# Patient Record
Sex: Female | Born: 1990 | Hispanic: Yes | Marital: Married | Smoking: Never smoker
Health system: Southern US, Community
[De-identification: ages and names within clinical notes are randomized; demographics above are authoritative.]

## PROBLEM LIST (undated history)

## (undated) ENCOUNTER — Inpatient Hospital Stay: Payer: Self-pay

## (undated) DIAGNOSIS — I10 Essential (primary) hypertension: Secondary | ICD-10-CM

## (undated) DIAGNOSIS — R519 Headache, unspecified: Secondary | ICD-10-CM

## (undated) HISTORY — DX: Essential (primary) hypertension: I10

## (undated) HISTORY — DX: Headache, unspecified: R51.9

---
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
## 2017-11-01 DIAGNOSIS — O24419 Gestational diabetes mellitus in pregnancy, unspecified control: Secondary | ICD-10-CM

## 2017-11-01 HISTORY — DX: Gestational diabetes mellitus in pregnancy, unspecified control: O24.419

## 2017-11-01 NOTE — L&D Delivery Note (Signed)
Delivery Note  First Stage: Labor onset: 1500 Augmentation : Pitocin Analgesia /Anesthesia intrapartum: epidural SROM at 1500  Second Stage: Complete dilation at 2215 Onset of pushing at 2215 FHR second stage n/a, precipitous delivery.   Delivery of a viable female on 06/24/18 at 2215 by CNM delivery of fetal head in LOA position with restitution to LOT. Loose nuchal cord x 1;  Anterior then posterior shoulders delivered easily with gentle downward traction. Baby placed on mom's chest, and attended to by peds.  Cord double clamped after cessation of pulsation, cut by FOB Cord blood sample collected    Third Stage: Placenta delivered spontaneously intact with [facility]3VC @ 2228 Placenta disposition: routine disposal Uterine tone firm / bleeding small  Right periurethral hemostatic laceration, no repair. No cervical/vaginal lacs.  Anesthesia for repair: n/a Est. Blood Loss (mL): 190  Complications: clot expressed by nursing approx 75min after delivery, additional QBL: 168ml; instructed to empty pt bladder and continue pitocin, fundus firm with scant lochia.   Mom to postpartum.  Baby to Couplet care / Skin to Skin.  Newborn: Birth Weight: 6#12  Apgar Scores: 9/9 Feeding planned: Both

## 2017-11-08 ENCOUNTER — Emergency Department: Payer: Self-pay

## 2017-11-08 ENCOUNTER — Other Ambulatory Visit: Payer: Self-pay

## 2017-11-08 ENCOUNTER — Emergency Department
Admission: EM | Admit: 2017-11-08 | Discharge: 2017-11-08 | Disposition: A | Discharge status: Home / Self Care | Payer: Self-pay | Attending: Student in an Organized Health Care Education/Training Program | Admitting: Student in an Organized Health Care Education/Training Program

## 2017-11-08 DIAGNOSIS — O26891 Other specified pregnancy related conditions, first trimester: Secondary | ICD-10-CM

## 2017-11-08 DIAGNOSIS — N390 Urinary tract infection, site not specified: Secondary | ICD-10-CM

## 2017-11-08 DIAGNOSIS — O2691 Pregnancy related conditions, unspecified, first trimester: Secondary | ICD-10-CM | POA: Insufficient documentation

## 2017-11-08 DIAGNOSIS — Z3A01 Less than 8 weeks gestation of pregnancy: Secondary | ICD-10-CM | POA: Insufficient documentation

## 2017-11-08 DIAGNOSIS — R102 Pelvic and perineal pain: Secondary | ICD-10-CM | POA: Insufficient documentation

## 2017-11-08 LAB — URINALYSIS, COMPLETE (UACMP) WITH MICROSCOPIC
Bilirubin Urine: NEGATIVE
Glucose, UA: NEGATIVE mg/dL
Hgb urine dipstick: NEGATIVE
Ketones, ur: NEGATIVE mg/dL
Leukocytes, UA: NEGATIVE
Nitrite: NEGATIVE
PH: 5 (ref 5.0–8.0)
Protein, ur: NEGATIVE mg/dL
SPECIFIC GRAVITY, URINE: 1.018 (ref 1.005–1.030)

## 2017-11-08 LAB — CBC WITH DIFFERENTIAL/PLATELET
BASOS ABS: 0 10*3/uL (ref 0–0.1)
Basophils Relative: 0 %
EOS PCT: 1 %
Eosinophils Absolute: 0.1 10*3/uL (ref 0–0.7)
HCT: 41.4 % (ref 35.0–47.0)
Hemoglobin: 14.5 g/dL (ref 12.0–16.0)
LYMPHS PCT: 28 %
Lymphs Abs: 2.7 10*3/uL (ref 1.0–3.6)
MCH: 32.1 pg (ref 26.0–34.0)
MCHC: 34.9 g/dL (ref 32.0–36.0)
MCV: 91.9 fL (ref 80.0–100.0)
MONO ABS: 0.5 10*3/uL (ref 0.2–0.9)
Monocytes Relative: 5 %
Neutro Abs: 6.3 10*3/uL (ref 1.4–6.5)
Neutrophils Relative %: 66 %
PLATELETS: 338 10*3/uL (ref 150–440)
RBC: 4.51 MIL/uL (ref 3.80–5.20)
RDW: 13.2 % (ref 11.5–14.5)
WBC: 9.6 10*3/uL (ref 3.6–11.0)

## 2017-11-08 LAB — BASIC METABOLIC PANEL
ANION GAP: 9 (ref 5–15)
BUN: 7 mg/dL (ref 6–20)
CALCIUM: 9.4 mg/dL (ref 8.9–10.3)
CO2: 22 mmol/L (ref 22–32)
CREATININE: 0.56 mg/dL (ref 0.44–1.00)
Chloride: 104 mmol/L (ref 101–111)
GFR calc Af Amer: >60 mL/min (ref >60)
GLUCOSE: 100 mg/dL — AB (ref 65–99)
Potassium: 4 mmol/L (ref 3.5–5.1)
Sodium: 135 mmol/L (ref 135–145)

## 2017-11-08 LAB — ABO/RH: ABO/RH(D): O POS

## 2017-11-08 LAB — HCG, QUANTITATIVE, PREGNANCY: HCG, BETA CHAIN, QUANT, S: 12887 m[IU]/mL — AB (ref <5)

## 2017-11-08 MED ORDER — NITROFURANTOIN MONOHYD MACRO 100 MG PO CAPS: 100.0000 mg | ORAL_CAPSULE | Freq: Two times a day (BID) | ORAL | 0 refills | Status: AC

## 2017-11-08 NOTE — ED Triage Notes (Signed)
Pt had positive home pregnancy test. Right sided abdominal pain. Sent from health department. Pt alert and oriented X4, active, cooperative, pt in NAD. RR even and unlabored, color WNL.

## 2017-11-08 NOTE — ED Triage Notes (Signed)
FIRST NURSE NOTE-sent from health dept. Pt pregnant based on urine and sent for r/o ectopic r/t RLQ pains

## 2017-11-08 NOTE — ED Notes (Signed)
Lab notified to run urine sample that was sent down with save label.

## 2017-11-08 NOTE — ED Provider Notes (Signed)
[facility] Emergency Department Provider Note    First MD Initiated Contact with Patient 11/08/17 1047     (approximate)  I have reviewed the triage vital signs and the nursing notes.   HISTORY  Chief Complaint Abdominal Pain    HPI Kathy Benton is a 27 y.o. female G2 P1 presents for evaluation concern for ectopic pregnancy.  Patient is uncertain of how long she is pregnant how far along she is as she was previously on Depo-Provera and had very irregular menses.  States that for the past few months she started feeling more sluggish and intermittently with nausea so she checked a home pregnancy test and it was positive.  She then went to the public health department today because she has been having cramping pain on the right side for the past 5 days.  No fevers.  No vaginal bleeding.  States the pain is moderate to severe when it happens but is brief in nature.  They repeated a home pregnancy test which was positive and referred her to the ER due to concern for ectopic.  Patient denies any syncopal episodes.  Denies any pain at this time.  History reviewed. No pertinent past medical history. No family history on file. History reviewed. No pertinent surgical history. There are no active problems to display for this patient.     Prior to Admission medications   Not on File    Allergies Patient has no known allergies.    Social History Social History   Tobacco Use  . Smoking status: Never Smoker  Substance Use Topics  . Alcohol use: No    Frequency: Never  . Drug use: Not on file    Review of Systems Patient denies headaches, rhinorrhea, blurry vision, numbness, shortness of breath, chest pain, edema, cough, abdominal pain, nausea, vomiting, diarrhea, dysuria, fevers, rashes or hallucinations unless otherwise stated above in HPI. ____________________________________________   PHYSICAL EXAM:  VITAL SIGNS: Vitals:   11/08/17 1026  11/08/17 1027  BP: 126/80   Pulse: 76   Resp: 20   Temp:  98.4 F (36.9 C)  SpO2: 97%     Constitutional: Alert and oriented. Well appearing and in no acute distress. Eyes: Conjunctivae are normal.  Head: Atraumatic. Nose: No congestion/rhinnorhea. Mouth/Throat: Mucous membranes are moist.   Neck: No stridor. Painless ROM.  Cardiovascular: Normal rate, regular rhythm. Grossly normal heart sounds.  Good peripheral circulation. Respiratory: Normal respiratory effort.  No retractions. Lungs CTAB. Gastrointestinal: Soft and nontender. No distention. No abdominal bruits. No CVA tenderness. Genitourinary: deferred Musculoskeletal: No lower extremity tenderness nor edema.  No joint effusions. Neurologic:  Normal speech and language. No gross focal neurologic deficits are appreciated. No facial droop Skin:  Skin is warm, dry and intact. No rash noted. Psychiatric: Mood and affect are normal. Speech and behavior are normal.  ____________________________________________   LABS (all labs ordered are listed, but only abnormal results are displayed)  Results for orders placed or performed during the hospital encounter of 11/08/17 (from the past 24 hour(s))  CBC with Differential/Platelet     Status: None   Collection Time: 11/08/17 10:27 AM  Result Value Ref Range   WBC 9.6 3.6 - 11.0 K/uL   RBC 4.51 3.80 - 5.20 MIL/uL   Hemoglobin 14.5 12.0 - 16.0 g/dL   HCT 16.1 09.6 - 04.5 %   MCV 91.9 80.0 - 100.0 fL   MCH 32.1 26.0 - 34.0 pg   MCHC 34.9 32.0 - 36.0 g/dL  RDW 13.2 11.5 - 14.5 %   Platelets 338 150 - 440 K/uL   Neutrophils Relative % 66 %   Neutro Abs 6.3 1.4 - 6.5 K/uL   Lymphocytes Relative 28 %   Lymphs Abs 2.7 1.0 - 3.6 K/uL   Monocytes Relative 5 %   Monocytes Absolute 0.5 0.2 - 0.9 K/uL   Eosinophils Relative 1 %   Eosinophils Absolute 0.1 0 - 0.7 K/uL   Basophils Relative 0 %   Basophils Absolute 0.0 0 - 0.1 K/uL    ____________________________________________ ____________________________________________  RADIOLOGY  I personally reviewed all radiographic images ordered to evaluate for the above acute complaints and reviewed radiology reports and findings.  These findings were personally discussed with the patient.  Please see medical record for radiology report.  ____________________________________________   PROCEDURES  Procedure(s) performed:  Procedures    Critical Care performed: no ____________________________________________   INITIAL IMPRESSION / ASSESSMENT AND PLAN / ED COURSE  Pertinent labs & imaging results that were available during my care of the patient were reviewed by me and considered in my medical decision making (see chart for details).  DDX: ectopic, abruoption, molar, iup, round ligament pain  Kathy Benton is a 27 y.o. who presents to the ED with chief complaint as above.  Patient well-appearing Heema dynamically stable.  Patient is Rh+.  Urinalysis does show evidence of bacteria in urine and therefore will treat with antibiotics.  No evidence of Pilo.  No evidence of infectious process.  Ultrasound shows evidence of probable IUP.  She does not have any signs or symptoms of a ruptured ectopic pregnancy.  Will need follow-up with health department and OB/GYN for repeat ultrasound and repeat beta hCG.  Have discussed with the patient and available family all diagnostics and treatments performed thus far and all questions were answered to the best of my ability. The patient demonstrates understanding and agreement with plan.       ____________________________________________   FINAL CLINICAL IMPRESSION(S) / ED DIAGNOSES  Final diagnoses:  Pelvic pain in pregnancy, antepartum, first trimester  Lower urinary tract infectious disease      NEW MEDICATIONS STARTED DURING THIS VISIT:  This SmartLink is deprecated. Use AVSMEDLIST instead to display the medication  list for a patient.   Note:  This document was prepared using Dragon voice recognition software and may include unintentional dictation errors.    Willy Eddyobinson, Leroi Haque, MD 11/08/17 1313

## 2017-11-08 NOTE — ED Notes (Signed)
Pt reports right lower abdominal cramping and pressure in pelvis. Pt states she came from health department and has positive pregnancy.  Pt given cup to obtain urine sample.  Pt states abdominal pain started the week after christmas.

## 2017-12-09 ENCOUNTER — Other Ambulatory Visit: Payer: Self-pay

## 2017-12-09 ENCOUNTER — Encounter: Payer: Self-pay | Admitting: Emergency Medicine

## 2017-12-09 ENCOUNTER — Emergency Department
Admission: EM | Admit: 2017-12-09 | Discharge: 2017-12-09 | Disposition: A | Discharge status: Home / Self Care | Payer: Self-pay | Attending: Emergency Medicine | Admitting: Emergency Medicine

## 2017-12-09 DIAGNOSIS — J069 Acute upper respiratory infection, unspecified: Secondary | ICD-10-CM | POA: Insufficient documentation

## 2017-12-09 DIAGNOSIS — Z3A08 8 weeks gestation of pregnancy: Secondary | ICD-10-CM | POA: Insufficient documentation

## 2017-12-09 DIAGNOSIS — B9789 Other viral agents as the cause of diseases classified elsewhere: Secondary | ICD-10-CM

## 2017-12-09 DIAGNOSIS — O99512 Diseases of the respiratory system complicating pregnancy, second trimester: Secondary | ICD-10-CM | POA: Insufficient documentation

## 2017-12-09 MED ORDER — METOCLOPRAMIDE HCL 10 MG PO TABS: 10.0000 mg | ORAL_TABLET | Freq: Three times a day (TID) | ORAL | 1 refills | Status: DC

## 2017-12-09 MED ORDER — FLUTICASONE PROPIONATE 50 MCG/ACT NA SUSP: 1.0000 | Freq: Two times a day (BID) | NASAL | 0 refills | Status: DC

## 2017-12-09 MED ORDER — MECLIZINE HCL 32 MG PO TABS: 32.0000 mg | ORAL_TABLET | Freq: Three times a day (TID) | ORAL | 0 refills | Status: DC | PRN

## 2017-12-09 NOTE — ED Notes (Signed)
PT states she has been sick with a cold and she wants to make sure its not the flu because she is pregnant. Started about 3 days ago. Family at bedside. Symptoms include congestion, cough, HA, fever, chills, and gagging.

## 2017-12-09 NOTE — ED Provider Notes (Signed)
[facility] Emergency Department Provider Note  ____________________________________________  Time seen: Approximately 10:10 PM  I have reviewed the triage vital signs and the nursing notes.   HISTORY  Chief Complaint Nasal Congestion and Cough    HPI Kathy Benton is a 27 y.o. female who presents the emergency department with insidious onset of nasal congestion, coughing.  Patient reports that symptoms were slow in developing.  She denies any fevers or chills, headache, visual changes, neck pain or stiffness, chest pain, shortness of breath, abdominal pain, vomiting, diarrhea or constipation, vaginal bleeding or discharge.  Patient is [redacted] weeks pregnant and was concerned that she may have the flu.  Patient has not taken any medications for this complaint prior to arrival.  She does endorse some mild interim emesis.  No other complaints at this time.  History reviewed. No pertinent past medical history.  There are no active problems to display for this patient.   History reviewed. No pertinent surgical history.  Prior to Admission medications   Medication Sig Start Date End Date Taking? Authorizing Provider  fluticasone (FLONASE) 50 MCG/ACT nasal spray Place 1 spray into both nostrils 2 (two) times daily. 12/09/17   Cuthriell, Delorise Royals, PA-C  metoCLOPramide (REGLAN) 10 MG tablet Take 1 tablet (10 mg total) by mouth 3 (three) times daily with meals. 12/09/17 12/09/18  Cuthriell, Delorise Royals, PA-C    Allergies Patient has no known allergies.  No family history on file.  Social History Social History   Tobacco Use  . Smoking status: Never Smoker  . Smokeless tobacco: Never Used  Substance Use Topics  . Alcohol use: No    Frequency: Never  . Drug use: No     Review of Systems  Constitutional: No fever/chills Eyes: No visual changes. No discharge ENT: Positive for nasal congestion Cardiovascular: no chest pain. Respiratory: Positive cough. No  SOB. Gastrointestinal: No abdominal pain.  No nausea, no vomiting.  No diarrhea.  No constipation. Genitourinary: Negative for dysuria. No hematuria.  No vaginal bleeding or discharge Musculoskeletal: Negative for musculoskeletal pain. Skin: Negative for rash, abrasions, lacerations, ecchymosis. Neurological: Negative for headaches, focal weakness or numbness. 10-point ROS otherwise negative.  ____________________________________________   PHYSICAL EXAM:  VITAL SIGNS: ED Triage Vitals  Enc Vitals Group     BP 12/09/17 1951 134/83     Pulse Rate 12/09/17 1951 84     Resp 12/09/17 1951 20     Temp 12/09/17 1951 99.3 F (37.4 C)     Temp Source 12/09/17 1951 Oral     SpO2 12/09/17 1951 100 %     Weight 12/09/17 1951 176 lb (79.8 kg)     Height 12/09/17 1951 4\' 11"  (1.499 m)     Head Circumference --      Peak Flow --      Pain Score 12/09/17 2012 0     Pain Loc --      Pain Edu? --      Excl. in GC? --      Constitutional: Alert and oriented. Well appearing and in no acute distress. Eyes: Conjunctivae are normal. PERRL. EOMI. Head: Atraumatic. ENT:      Ears: EACs and TMs unremarkable bilaterally.      Nose: Minimal clear congestion/rhinnorhea.      Mouth/Throat: Mucous membranes are moist.  Oropharynx is nonerythematous and nonedematous.  Tonsils are unremarkable bilaterally.  Uvula is midline.   Neck: No stridor.  Neck is supple full range of motion Hematological/Lymphatic/Immunilogical: No  cervical lymphadenopathy. Cardiovascular: Normal rate, regular rhythm. Normal S1 and S2.  Good peripheral circulation. Respiratory: Normal respiratory effort without tachypnea or retractions. Lungs CTAB. Good air entry to the bases with no decreased or absent breath sounds. Gastrointestinal: Bowel sounds 4 quadrants. Soft and nontender to palpation. No guarding or rigidity. No palpable masses. No distention. No CVA tenderness. Musculoskeletal: Full range of motion to all extremities.  No gross deformities appreciated. Neurologic:  Normal speech and language. No gross focal neurologic deficits are appreciated.  Skin:  Skin is warm, dry and intact. No rash noted. Psychiatric: Mood and affect are normal. Speech and behavior are normal. Patient exhibits appropriate insight and judgement.   ____________________________________________   LABS (all labs ordered are listed, but only abnormal results are displayed)  Labs Reviewed - No data to display ____________________________________________  EKG   ____________________________________________  RADIOLOGY   No results found.  ____________________________________________    PROCEDURES  Procedure(s) performed:    Procedures    Medications - No data to display   ____________________________________________   INITIAL IMPRESSION / ASSESSMENT AND PLAN / ED COURSE  Pertinent labs & imaging results that were available during my care of the patient were reviewed by me and considered in my medical decision making (see chart for details).  Review of the [place] CSRS was performed in accordance of the [facility] prior to dispensing any controlled drugs.     Patient's diagnosis is consistent with viral URI with cough.  Patient presents emergency department with 2-1/2-day onset of nasal congestion and cough.  No substantial fevers, sore throat, difficulty breathing, abdominal pain, vaginal bleeding or discharge.  Patient is pregnant and was concerned that she may have influenza.  At this time, patient does not meet the clinical picture of flu but I have offered influenza testing.  After discussion, patient declines influenza testing.  At this time she will be given Flonase for nasal congestion at this time, no indication for further workup or imaging.  Patient may take Tylenol and raw honey for other symptoms..  She will follow-up with primary care or OB/GYN as needed.  Patient is given ED precautions to return to the ED for  any worsening or new symptoms.     ____________________________________________  FINAL CLINICAL IMPRESSION(S) / ED DIAGNOSES  Final diagnoses:  Viral URI with cough      NEW MEDICATIONS STARTED DURING THIS VISIT:  ED Discharge Orders        Ordered    meclizine (ANTIVERT) 32 MG tablet  3 times daily PRN,   Status:  Discontinued     12/09/17 2214    fluticasone (FLONASE) 50 MCG/ACT nasal spray  2 times daily,   Status:  Discontinued     12/09/17 2214    metoCLOPramide (REGLAN) 10 MG tablet  3 times daily with meals     12/09/17 2214    fluticasone (FLONASE) 50 MCG/ACT nasal spray  2 times daily     12/09/17 2214          This chart was dictated using voice recognition software/Dragon. Despite best efforts to proofread, errors can occur which can change the meaning. Any change was purely unintentional.    Racheal PatchesCuthriell, Jonathan D, PA-C 12/09/17 2232    Rockne MenghiniNorman, Anne-Caroline, MD 12/09/17 [id number]

## 2017-12-09 NOTE — ED Triage Notes (Signed)
Pt to ED via POV with c/o nasal congestion, cough, cold like symptoms. Pt wants to be evaluated because she is [redacted]wks pregnant. Pt states intermit chills at home. Pt in NAD at this time, VS stable

## 2017-12-19 ENCOUNTER — Other Ambulatory Visit (HOSPITAL_COMMUNITY): Payer: Self-pay | Admitting: Family Medicine

## 2017-12-19 DIAGNOSIS — O3680X0 Pregnancy with inconclusive fetal viability, not applicable or unspecified: Secondary | ICD-10-CM

## 2017-12-21 ENCOUNTER — Other Ambulatory Visit (HOSPITAL_COMMUNITY): Payer: Self-pay | Admitting: Family Medicine

## 2017-12-21 ENCOUNTER — Ambulatory Visit
Admission: RE | Admit: 2017-12-21 | Discharge: 2017-12-21 | Disposition: A | Discharge status: Home / Self Care | Payer: Self-pay | Source: Ambulatory Visit | Attending: Family Medicine | Admitting: Family Medicine

## 2017-12-21 DIAGNOSIS — O3680X0 Pregnancy with inconclusive fetal viability, not applicable or unspecified: Secondary | ICD-10-CM

## 2017-12-21 DIAGNOSIS — Z3A12 12 weeks gestation of pregnancy: Secondary | ICD-10-CM | POA: Insufficient documentation

## 2018-01-24 ENCOUNTER — Other Ambulatory Visit: Payer: Self-pay | Admitting: Advanced Practice Midwife

## 2018-01-24 DIAGNOSIS — O99210 Obesity complicating pregnancy, unspecified trimester: Secondary | ICD-10-CM | POA: Insufficient documentation

## 2018-01-24 DIAGNOSIS — E669 Obesity, unspecified: Secondary | ICD-10-CM | POA: Insufficient documentation

## 2018-01-24 DIAGNOSIS — I10 Essential (primary) hypertension: Secondary | ICD-10-CM | POA: Insufficient documentation

## 2018-01-24 DIAGNOSIS — Z3482 Encounter for supervision of other normal pregnancy, second trimester: Secondary | ICD-10-CM

## 2018-01-24 LAB — HM PAP SMEAR: HM Pap smear: NEGATIVE

## 2018-01-24 LAB — OB RESULTS CONSOLE HIV ANTIBODY (ROUTINE TESTING): HIV: NONREACTIVE

## 2018-01-24 LAB — HM HIV SCREENING LAB: HM HIV Screening: NEGATIVE

## 2018-01-25 LAB — OB RESULTS CONSOLE HEPATITIS B SURFACE ANTIGEN: Hepatitis B Surface Ag: NEGATIVE

## 2018-01-25 LAB — OB RESULTS CONSOLE RPR: RPR: NONREACTIVE

## 2018-01-27 ENCOUNTER — Ambulatory Visit
Admission: RE | Admit: 2018-01-27 | Discharge: 2018-01-27 | Disposition: A | Discharge status: Home / Self Care | Payer: Self-pay | Source: Ambulatory Visit | Attending: Advanced Practice Midwife | Admitting: Advanced Practice Midwife

## 2018-02-20 ENCOUNTER — Emergency Department
Admission: EM | Admit: 2018-02-20 | Discharge: 2018-02-20 | Disposition: A | Discharge status: Home / Self Care | Payer: Self-pay | Attending: Student in an Organized Health Care Education/Training Program | Admitting: Student in an Organized Health Care Education/Training Program

## 2018-02-20 ENCOUNTER — Other Ambulatory Visit: Payer: Self-pay

## 2018-02-20 DIAGNOSIS — Z3A19 19 weeks gestation of pregnancy: Secondary | ICD-10-CM | POA: Insufficient documentation

## 2018-02-20 DIAGNOSIS — O219 Vomiting of pregnancy, unspecified: Secondary | ICD-10-CM | POA: Insufficient documentation

## 2018-02-20 DIAGNOSIS — R110 Nausea: Secondary | ICD-10-CM

## 2018-02-20 DIAGNOSIS — O26892 Other specified pregnancy related conditions, second trimester: Secondary | ICD-10-CM | POA: Insufficient documentation

## 2018-02-20 DIAGNOSIS — R197 Diarrhea, unspecified: Secondary | ICD-10-CM | POA: Insufficient documentation

## 2018-02-20 DIAGNOSIS — Z79899 Other long term (current) drug therapy: Secondary | ICD-10-CM | POA: Insufficient documentation

## 2018-02-20 LAB — CBC
HEMATOCRIT: 37.8 % (ref 35.0–47.0)
HEMOGLOBIN: 13 g/dL (ref 12.0–16.0)
MCH: 31.9 pg (ref 26.0–34.0)
MCHC: 34.5 g/dL (ref 32.0–36.0)
MCV: 92.5 fL (ref 80.0–100.0)
Platelets: 274 10*3/uL (ref 150–440)
RBC: 4.09 MIL/uL (ref 3.80–5.20)
RDW: 13.4 % (ref 11.5–14.5)
WBC: 10.9 10*3/uL (ref 3.6–11.0)

## 2018-02-20 LAB — URINALYSIS, COMPLETE (UACMP) WITH MICROSCOPIC
Bilirubin Urine: NEGATIVE
Glucose, UA: NEGATIVE mg/dL
HGB URINE DIPSTICK: NEGATIVE
Ketones, ur: NEGATIVE mg/dL
Leukocytes, UA: NEGATIVE
Nitrite: NEGATIVE
PROTEIN: NEGATIVE mg/dL
Specific Gravity, Urine: 1.01 (ref 1.005–1.030)
pH: 5 (ref 5.0–8.0)

## 2018-02-20 LAB — COMPREHENSIVE METABOLIC PANEL
ALBUMIN: 3.5 g/dL (ref 3.5–5.0)
ALT: 27 U/L (ref 14–54)
ANION GAP: 7 (ref 5–15)
AST: 31 U/L (ref 15–41)
Alkaline Phosphatase: 58 U/L (ref 38–126)
BUN: 7 mg/dL (ref 6–20)
CALCIUM: 8.7 mg/dL — AB (ref 8.9–10.3)
CHLORIDE: 104 mmol/L (ref 101–111)
CO2: 23 mmol/L (ref 22–32)
Creatinine, Ser: 0.38 mg/dL — ABNORMAL LOW (ref 0.44–1.00)
GFR calc non Af Amer: >60 mL/min (ref >60)
GLUCOSE: 94 mg/dL (ref 65–99)
POTASSIUM: 3.7 mmol/L (ref 3.5–5.1)
SODIUM: 134 mmol/L — AB (ref 135–145)
Total Bilirubin: 0.7 mg/dL (ref 0.3–1.2)
Total Protein: 7 g/dL (ref 6.5–8.1)

## 2018-02-20 LAB — LIPASE, BLOOD: LIPASE: 24 U/L (ref 11–51)

## 2018-02-20 MED ORDER — METOCLOPRAMIDE HCL 10 MG PO TABS: 10.0000 mg | ORAL_TABLET | Freq: Four times a day (QID) | ORAL | 0 refills | Status: DC | PRN

## 2018-02-20 MED ORDER — METOCLOPRAMIDE HCL 5 MG/ML IJ SOLN
10.0000 mg | Freq: Once | INTRAMUSCULAR | Status: AC
  Administered 2018-02-20: 10 mg via INTRAVENOUS
  Filled 2018-02-20: qty 2

## 2018-02-20 MED ORDER — SODIUM CHLORIDE 0.9 % IV BOLUS
1000.0000 mL | Freq: Once | INTRAVENOUS | Status: AC
  Administered 2018-02-20: 1000 mL via INTRAVENOUS

## 2018-02-20 NOTE — ED Triage Notes (Signed)
Ate out with family members yesterday.  Last night started having constant diarrhea.  Several family members are having gi symptoms, but she is concerned because she is pregnant

## 2018-02-20 NOTE — ED Notes (Signed)
Fetal heart tones 144

## 2018-02-20 NOTE — ED Notes (Signed)
ivf infusing.  Says she is burping a lot.  Has been up to bathroom x 1 since here.

## 2018-02-20 NOTE — ED Provider Notes (Signed)
[facility] Emergency Department Provider Note    First MD Initiated Contact with Patient 02/20/18 1539     (approximate)  I have reviewed the triage vital signs and the nursing notes.   HISTORY  Chief Complaint Diarrhea    HPI Kathy Benton is a 27 y.o. female who is [redacted] weeks pregnant presents with her daughter after the family got sick with a GI illness last night.  Thinks that she got some form of food poisoning as they all ate at a [employer] last night and throughout the evening all members the family developed either nausea vomiting or diarrhea or all of the above.  Denies any bloody GI diarrhea.  Is having some dysuria.  No vaginal bleeding.  Denies any vomiting.  Does have some epigastric discomfort.  No past medical history on file. No family history on file. No past surgical history on file. There are no active problems to display for this patient.     Prior to Admission medications   Medication Sig Start Date End Date Taking? Authorizing Provider  fluticasone (FLONASE) 50 MCG/ACT nasal spray Place 1 spray into both nostrils 2 (two) times daily. 12/09/17   Cuthriell, Delorise Royals, PA-C  metoCLOPramide (REGLAN) 10 MG tablet Take 1 tablet (10 mg total) by mouth 3 (three) times daily with meals. 12/09/17 12/09/18  Cuthriell, Delorise Royals, PA-C    Allergies Patient has no known allergies.    Social History Social History   Tobacco Use  . Smoking status: Never Smoker  . Smokeless tobacco: Never Used  Substance Use Topics  . Alcohol use: No    Frequency: Never  . Drug use: No    Review of Systems Patient denies headaches, rhinorrhea, blurry vision, numbness, shortness of breath, chest pain, edema, cough, abdominal pain, nausea, vomiting, diarrhea, dysuria, fevers, rashes or hallucinations unless otherwise stated above in HPI. ____________________________________________   PHYSICAL EXAM:  VITAL SIGNS: Vitals:   02/20/18 1456  BP:  131/66  Pulse: 100  Resp: 16  Temp: 98.9 F (37.2 C)  SpO2: 100%    Constitutional: Alert and oriented. Well appearing and in no acute distress. Eyes: Conjunctivae are normal.  Head: Atraumatic. Nose: No congestion/rhinnorhea. Mouth/Throat: Mucous membranes are moist.   Neck: No stridor. Painless ROM.  Cardiovascular: Normal rate, regular rhythm. Grossly normal heart sounds.  Good peripheral circulation. Respiratory: Normal respiratory effort.  No retractions. Lungs CTAB. Gastrointestinal: Soft and nontender. No distention. No abdominal bruits. No CVA tenderness. Genitourinary:  Musculoskeletal: No lower extremity tenderness nor edema.  No joint effusions. Neurologic:  Normal speech and language. No gross focal neurologic deficits are appreciated. No facial droop Skin:  Skin is warm, dry and intact. No rash noted. Psychiatric: Mood and affect are normal. Speech and behavior are normal.  ____________________________________________   LABS (all labs ordered are listed, but only abnormal results are displayed)  Results for orders placed or performed during the hospital encounter of 02/20/18 (from the past 24 hour(s))  Urinalysis, Complete w Microscopic     Status: Abnormal   Collection Time: 02/20/18  2:59 PM  Result Value Ref Range   Color, Urine YELLOW (A) YELLOW   APPearance CLEAR (A) CLEAR   Specific Gravity, Urine 1.010 1.005 - 1.030   pH 5.0 5.0 - 8.0   Glucose, UA NEGATIVE NEGATIVE mg/dL   Hgb urine dipstick NEGATIVE NEGATIVE   Bilirubin Urine NEGATIVE NEGATIVE   Ketones, ur NEGATIVE NEGATIVE mg/dL   Protein, ur NEGATIVE NEGATIVE mg/dL  Nitrite NEGATIVE NEGATIVE   Leukocytes, UA NEGATIVE NEGATIVE   RBC / HPF 0-5 0 - 5 RBC/hpf   WBC, UA 0-5 0 - 5 WBC/hpf   Squamous Epithelial / LPF 0-5 (A) NONE SEEN   Mucus PRESENT   Lipase, blood     Status: None   Collection Time: 02/20/18  3:04 PM  Result Value Ref Range   Lipase 24 11 - 51 U/L  Comprehensive metabolic panel      Status: Abnormal   Collection Time: 02/20/18  3:04 PM  Result Value Ref Range   Sodium 134 (L) 135 - 145 mmol/L   Potassium 3.7 3.5 - 5.1 mmol/L   Chloride 104 101 - 111 mmol/L   CO2 23 22 - 32 mmol/L   Glucose, Bld 94 65 - 99 mg/dL   BUN 7 6 - 20 mg/dL   Creatinine, Ser 1.610.38 (L) 0.44 - 1.00 mg/dL   Calcium 8.7 (L) 8.9 - 10.3 mg/dL   Total Protein 7.0 6.5 - 8.1 g/dL   Albumin 3.5 3.5 - 5.0 g/dL   AST 31 15 - 41 U/L   ALT 27 14 - 54 U/L   Alkaline Phosphatase 58 38 - 126 U/L   Total Bilirubin 0.7 0.3 - 1.2 mg/dL   GFR calc non Af Amer >60 >60 mL/min   GFR calc Af Amer >60 >60 mL/min   Anion gap 7 5 - 15  CBC     Status: None   Collection Time: 02/20/18  3:04 PM  Result Value Ref Range   WBC 10.9 3.6 - 11.0 K/uL   RBC 4.09 3.80 - 5.20 MIL/uL   Hemoglobin 13.0 12.0 - 16.0 g/dL   HCT 09.637.8 04.535.0 - 40.947.0 %   MCV 92.5 80.0 - 100.0 fL   MCH 31.9 26.0 - 34.0 pg   MCHC 34.5 32.0 - 36.0 g/dL   RDW 81.113.4 91.411.5 - 78.214.5 %   Platelets 274 150 - 440 K/uL   ____________________________________________ ____________________________________________  RADIOLOGY   ____________________________________________   PROCEDURES  Procedure(s) performed:  Procedures    Critical Care performed: no ____________________________________________   INITIAL IMPRESSION / ASSESSMENT AND PLAN / ED COURSE  Pertinent labs & imaging results that were available during my care of the patient were reviewed by me and considered in my medical decision making (see chart for details).  DDX: Gastroenteritis, enteritis, viral process, food poisoning, UTI  Kathy Benton is a 27 y.o. who presents to the ED with symptoms as described above.  Reassuring exam and blood work.  Patient in no acute distress.  She is pregnant and having difficulty tolerating p.o. we will give IV fluid boluses and IV antiemetics and reassess.  Fetal heart tones are reassuring.   Patient received IV fluids as well as IV antiemetics and  tolerating oral hydration.  No evidence of UTI.  Symptoms and presentation most clinically consistent with food poisoning.  Patient's symptoms are mild and patient is stable and appropriate for outpatient follow-up.   As part of my medical decision making, I reviewed the following data within the electronic MEDICAL RECORD NUMBER Nursing notes reviewed and incorporated, Labs reviewed, notes from prior ED visits.   ____________________________________________   FINAL CLINICAL IMPRESSION(S) / ED DIAGNOSES  Final diagnoses:  Nausea  Diarrhea, unspecified type      NEW MEDICATIONS STARTED DURING THIS VISIT:  New Prescriptions   No medications on file     Note:  This document was prepared using Dragon voice recognition software and  may include unintentional dictation errors.    Willy Eddy, MD 02/20/18 2230

## 2018-05-12 LAB — OB RESULTS CONSOLE RUBELLA ANTIBODY, IGM: Rubella: IMMUNE

## 2018-05-12 LAB — OB RESULTS CONSOLE VARICELLA ZOSTER ANTIBODY, IGG: Varicella: NON-IMMUNE/NOT IMMUNE

## 2018-05-16 ENCOUNTER — Ambulatory Visit: Payer: Self-pay | Admitting: Dietician

## 2018-05-18 ENCOUNTER — Encounter: Payer: Self-pay | Admitting: *Deleted

## 2018-06-19 ENCOUNTER — Encounter: Payer: Self-pay | Admitting: *Deleted

## 2018-06-19 ENCOUNTER — Inpatient Hospital Stay
Admission: EM | Admit: 2018-06-19 | Discharge: 2018-06-19 | Disposition: A | Discharge status: Home / Self Care | Payer: Self-pay | Attending: Obstetrics and Gynecology | Admitting: Obstetrics and Gynecology

## 2018-06-19 DIAGNOSIS — Z3A36 36 weeks gestation of pregnancy: Secondary | ICD-10-CM | POA: Insufficient documentation

## 2018-06-19 DIAGNOSIS — O4700 False labor before 37 completed weeks of gestation, unspecified trimester: Secondary | ICD-10-CM

## 2018-06-19 DIAGNOSIS — O479 False labor, unspecified: Secondary | ICD-10-CM

## 2018-06-19 LAB — URINE DRUG SCREEN, QUALITATIVE (ARMC ONLY)
Amphetamines, Ur Screen: NOT DETECTED
Barbiturates, Ur Screen: NOT DETECTED
CANNABINOID 50 NG, UR ~~LOC~~: NOT DETECTED
Cocaine Metabolite,Ur ~~LOC~~: NOT DETECTED
MDMA (Ecstasy)Ur Screen: NOT DETECTED
Methadone Scn, Ur: NOT DETECTED
OPIATE, UR SCREEN: NOT DETECTED
PHENCYCLIDINE (PCP) UR S: NOT DETECTED
Tricyclic, Ur Screen: NOT DETECTED

## 2018-06-19 LAB — URINALYSIS, ROUTINE W REFLEX MICROSCOPIC
BILIRUBIN URINE: NEGATIVE
GLUCOSE, UA: NEGATIVE mg/dL
Hgb urine dipstick: NEGATIVE
Ketones, ur: NEGATIVE mg/dL
NITRITE: NEGATIVE
PH: 5 (ref 5.0–8.0)
PROTEIN: NEGATIVE mg/dL
Specific Gravity, Urine: 1.019 (ref 1.005–1.030)

## 2018-06-19 LAB — ROM PLUS (ARMC ONLY): ROM PLUS: NEGATIVE

## 2018-06-19 MED ORDER — TERBUTALINE SULFATE 1 MG/ML IJ SOLN: 0.2500 mg | Freq: Once | INTRAMUSCULAR | Status: DC | PRN

## 2018-06-19 MED ORDER — LACTATED RINGERS IV SOLN: 500.0000 mL/h | Freq: Once | INTRAVENOUS | Status: DC

## 2018-06-19 MED ORDER — ZOLPIDEM TARTRATE 5 MG PO TABS: 5.0000 mg | ORAL_TABLET | Freq: Every evening | ORAL | Status: DC | PRN

## 2018-06-19 MED ORDER — BETAMETHASONE SOD PHOS & ACET 6 (3-3) MG/ML IJ SUSP: 12.0000 mg | INTRAMUSCULAR | Status: DC

## 2018-06-19 MED ORDER — CALCIUM CARBONATE ANTACID 500 MG PO CHEW: 2.0000 | CHEWABLE_TABLET | ORAL | Status: DC | PRN

## 2018-06-19 MED ORDER — PRENATAL MULTIVITAMIN CH: 1.0000 | ORAL_TABLET | Freq: Every day | ORAL | Status: DC

## 2018-06-19 MED ORDER — DOCUSATE SODIUM 100 MG PO CAPS: 100.0000 mg | ORAL_CAPSULE | Freq: Every day | ORAL | Status: DC

## 2018-06-19 MED ORDER — ACETAMINOPHEN 325 MG PO TABS: 650.0000 mg | ORAL_TABLET | ORAL | Status: DC | PRN

## 2018-06-19 NOTE — Final Progress Note (Signed)
ROM-plus negative for premature rupture of membranes Cervix - thick and not laboring UA negative for infections.  D/C home with precautions.

## 2018-06-19 NOTE — OB Triage Note (Signed)
Presents with complaint of some sharp pains that started last night - started as cramps then became more painful. States that they were continuous all night. States that she "may or may not have peed on herself this morning" no leaking since but has some mucous discharge.  Complains of having pressure and feeling pressure when she has to urinate.  Denies any bloody show.   Monitors applied .

## 2018-06-19 NOTE — ED Triage Notes (Signed)
TRIAGE NOTE to rule out Preterm Labor   History of Present Illness: Kathy Benton is a 27 y.o. [id number] at 5821w4d presenting to triage for rule out preterm labor. Pt reports leaking fluid, feeling contractions and pelvic pressure.  No past medical history on file.  History reviewed. No pertinent surgical history.  OB History  Gravida Para Term Preterm AB Living  5 3 3   1 3   SAB TAB Ectopic Multiple Live Births               # Outcome Date GA Lbr Len/2nd Weight Sex Delivery Anes PTL Lv  5 Current           4 AB           3 Term           2 Term           1 Term             Social History   Socioeconomic History  . Marital status: Single    Spouse name: Not on file  . Number of children: Not on file  . Years of education: Not on file  . Highest education level: Not on file  Occupational History  . Not on file  Social Needs  . Financial resource strain: Not on file  . Food insecurity:    Worry: Not on file    Inability: Not on file  . Transportation needs:    Medical: Not on file    Non-medical: Not on file  Tobacco Use  . Smoking status: Never Smoker  . Smokeless tobacco: Never Used  Substance and Sexual Activity  . Alcohol use: No    Frequency: Never  . Drug use: No  . Sexual activity: Not on file  Lifestyle  . Physical activity:    Days per week: Not on file    Minutes per session: Not on file  . Stress: Not on file  Relationships  . Social connections:    Talks on phone: Not on file    Gets together: Not on file    Attends religious service: Not on file    Active member of club or organization: Not on file    Attends meetings of clubs or organizations: Not on file    Relationship status: Not on file  Other Topics Concern  . Not on file  Social History Narrative  . Not on file    History reviewed. No pertinent family history.  No Known Allergies  Medications Prior to Admission  Medication Sig Dispense Refill Last Dose  . fluticasone (FLONASE)  50 MCG/ACT nasal spray Place 1 spray into both nostrils 2 (two) times daily. (Patient not taking: Reported on 06/19/2018) 16 g 0 Not Taking at Unknown time  . metoCLOPramide (REGLAN) 10 MG tablet Take 1 tablet (10 mg total) by mouth 3 (three) times daily with meals. (Patient not taking: Reported on 06/19/2018) 90 tablet 1 Not Taking at Unknown time  . metoCLOPramide (REGLAN) 10 MG tablet Take 1 tablet (10 mg total) by mouth every 6 (six) hours as needed for nausea. (Patient not taking: Reported on 06/19/2018) 12 tablet 0 Not Taking at Unknown time    Review of Systems - See HPI for OB specific ROS.   Vitals:  BP 129/75 (BP Location: Right Arm)   Pulse 87   Temp 98.1 F (36.7 C) (Oral)   Resp 18   Ht 4\' 11"  (1.499 m)   Wt 80.7 kg  LMP  (LMP Unknown)   BMI 35.95 kg/m  Physical Examination: CONSTITUTIONAL: Well-developed, well-nourished female in no acute distress.  HENT:  Normocephalic, atraumatic EYES: Conjunctivae and EOM are normal. No scleral icterus.  NECK: Normal range of motion, supple, SKIN: Skin is warm and dry. No rash noted. Not diaphoretic. No erythema. No pallor. NEUROLGIC: Alert and oriented to person, place, and time. No gross cranial nerve deficit noted. PSYCHIATRIC: Normal mood and affect. Normal behavior. Normal judgment and thought content.  ABDOMEN: Soft, nontender, nondistended, gravid.  Cervix: 1/thick/high Membranes:intact Fetal Monitoring:Baseline: 135 bpm, Variability: Good {> 6 bpm), Accelerations: Reactive and Decelerations: Absent Tocometer: intermittent  Labs:  No results found for this or any previous visit (from the past 24 hour(s)).  Imaging Studies: No results found.   Assessment and Plan: There are no active problems to display for this patient.   1. Concern for preterm labor:  - Labs ROM-plus, urinalysis. - Continue fetal monitoring - Continuous toco   Cline CoolsBethany E Jahron Hunsinger, MD, MPH

## 2018-06-24 ENCOUNTER — Inpatient Hospital Stay: Payer: Medicaid Other | Admitting: Anesthesiology

## 2018-06-24 ENCOUNTER — Inpatient Hospital Stay
Admission: EM | Admit: 2018-06-24 | Discharge: 2018-06-26 | DRG: 806 | Disposition: A | Discharge status: Home / Self Care | Payer: Medicaid Other | Attending: Obstetrics and Gynecology | Admitting: Obstetrics and Gynecology

## 2018-06-24 ENCOUNTER — Other Ambulatory Visit: Payer: Self-pay

## 2018-06-24 DIAGNOSIS — Z3A38 38 weeks gestation of pregnancy: Secondary | ICD-10-CM

## 2018-06-24 DIAGNOSIS — Z23 Encounter for immunization: Secondary | ICD-10-CM

## 2018-06-24 DIAGNOSIS — O99214 Obesity complicating childbirth: Secondary | ICD-10-CM | POA: Diagnosis present

## 2018-06-24 DIAGNOSIS — O4292 Full-term premature rupture of membranes, unspecified as to length of time between rupture and onset of labor: Principal | ICD-10-CM | POA: Diagnosis present

## 2018-06-24 DIAGNOSIS — D649 Anemia, unspecified: Secondary | ICD-10-CM | POA: Diagnosis present

## 2018-06-24 DIAGNOSIS — O9902 Anemia complicating childbirth: Secondary | ICD-10-CM | POA: Diagnosis present

## 2018-06-24 DIAGNOSIS — O24420 Gestational diabetes mellitus in childbirth, diet controlled: Secondary | ICD-10-CM | POA: Diagnosis present

## 2018-06-24 DIAGNOSIS — O1002 Pre-existing essential hypertension complicating childbirth: Secondary | ICD-10-CM | POA: Diagnosis present

## 2018-06-24 DIAGNOSIS — E669 Obesity, unspecified: Secondary | ICD-10-CM | POA: Diagnosis present

## 2018-06-24 DIAGNOSIS — O24419 Gestational diabetes mellitus in pregnancy, unspecified control: Secondary | ICD-10-CM | POA: Diagnosis present

## 2018-06-24 LAB — COMPREHENSIVE METABOLIC PANEL
ALT: 13 U/L (ref 0–44)
ANION GAP: 9 (ref 5–15)
AST: 17 U/L (ref 15–41)
Albumin: 3 g/dL — ABNORMAL LOW (ref 3.5–5.0)
Alkaline Phosphatase: 159 U/L — ABNORMAL HIGH (ref 38–126)
BUN: 10 mg/dL (ref 6–20)
CALCIUM: 8.8 mg/dL — AB (ref 8.9–10.3)
CO2: 22 mmol/L (ref 22–32)
Chloride: 107 mmol/L (ref 98–111)
Creatinine, Ser: 0.47 mg/dL (ref 0.44–1.00)
GFR calc non Af Amer: >60 mL/min (ref >60)
Glucose, Bld: 73 mg/dL (ref 70–99)
POTASSIUM: 3.6 mmol/L (ref 3.5–5.1)
SODIUM: 138 mmol/L (ref 135–145)
Total Bilirubin: 0.7 mg/dL (ref 0.3–1.2)
Total Protein: 6.8 g/dL (ref 6.5–8.1)

## 2018-06-24 LAB — URINE DRUG SCREEN, QUALITATIVE (ARMC ONLY)
Amphetamines, Ur Screen: NOT DETECTED
Barbiturates, Ur Screen: NOT DETECTED
CANNABINOID 50 NG, UR ~~LOC~~: NOT DETECTED
Cocaine Metabolite,Ur ~~LOC~~: NOT DETECTED
MDMA (Ecstasy)Ur Screen: NOT DETECTED
Methadone Scn, Ur: NOT DETECTED
Opiate, Ur Screen: NOT DETECTED
PHENCYCLIDINE (PCP) UR S: NOT DETECTED
Tricyclic, Ur Screen: NOT DETECTED

## 2018-06-24 LAB — CHLAMYDIA/NGC RT PCR (ARMC ONLY)
CHLAMYDIA TR: NOT DETECTED
N GONORRHOEAE: NOT DETECTED

## 2018-06-24 LAB — TYPE AND SCREEN
ABO/RH(D): O POS
ANTIBODY SCREEN: NEGATIVE

## 2018-06-24 LAB — CBC
HCT: 36.3 % (ref 35.0–47.0)
Hemoglobin: 12.8 g/dL (ref 12.0–16.0)
MCH: 31.3 pg (ref 26.0–34.0)
MCHC: 35.1 g/dL (ref 32.0–36.0)
MCV: 89 fL (ref 80.0–100.0)
PLATELETS: 297 10*3/uL (ref 150–440)
RBC: 4.08 MIL/uL (ref 3.80–5.20)
RDW: 14.1 % (ref 11.5–14.5)
WBC: 10.4 10*3/uL (ref 3.6–11.0)

## 2018-06-24 LAB — GLUCOSE, CAPILLARY: GLUCOSE-CAPILLARY: 62 mg/dL — AB (ref 70–99)

## 2018-06-24 LAB — OB RESULTS CONSOLE GC/CHLAMYDIA
CHLAMYDIA, DNA PROBE: NEGATIVE
GC PROBE AMP, GENITAL: NEGATIVE

## 2018-06-24 MED ORDER — SODIUM CHLORIDE 0.9 % IV SOLN
2.0000 g | Freq: Once | INTRAVENOUS | Status: DC
  Filled 2018-06-24: qty 2000

## 2018-06-24 MED ORDER — TERBUTALINE SULFATE 1 MG/ML IJ SOLN: 0.2500 mg | Freq: Once | INTRAMUSCULAR | Status: DC | PRN

## 2018-06-24 MED ORDER — DIPHENHYDRAMINE HCL 50 MG/ML IJ SOLN: 12.5000 mg | INTRAMUSCULAR | Status: DC | PRN

## 2018-06-24 MED ORDER — EPHEDRINE 5 MG/ML INJ
10.0000 mg | INTRAVENOUS | Status: DC | PRN
  Filled 2018-06-24: qty 2

## 2018-06-24 MED ORDER — WITCH HAZEL-GLYCERIN EX PADS
1.0000 "application " | MEDICATED_PAD | CUTANEOUS | Status: DC | PRN
  Administered 2018-06-25: 1 via TOPICAL
  Filled 2018-06-24: qty 100

## 2018-06-24 MED ORDER — OXYTOCIN 40 UNITS IN LACTATED RINGERS INFUSION - SIMPLE MED
2.5000 [IU]/h | INTRAVENOUS | Status: DC
  Filled 2018-06-24: qty 1000

## 2018-06-24 MED ORDER — LACTATED RINGERS IV SOLN
INTRAVENOUS | Status: DC
  Administered 2018-06-24: 17:00:00 via INTRAVENOUS

## 2018-06-24 MED ORDER — LACTATED RINGERS IV SOLN: 500.0000 mL | Freq: Once | INTRAVENOUS | Status: DC

## 2018-06-24 MED ORDER — FENTANYL 2.5 MCG/ML W/ROPIVACAINE 0.15% IN NS 100 ML EPIDURAL (ARMC)
12.0000 mL/h | EPIDURAL | Status: DC
  Administered 2018-06-24: 12 mL/h via EPIDURAL

## 2018-06-24 MED ORDER — SIMETHICONE 80 MG PO CHEW: 80.0000 mg | CHEWABLE_TABLET | ORAL | Status: DC | PRN

## 2018-06-24 MED ORDER — OXYTOCIN 40 UNITS IN LACTATED RINGERS INFUSION - SIMPLE MED
1.0000 m[IU]/min | INTRAVENOUS | Status: DC
  Administered 2018-06-24: 2 m[IU]/min via INTRAVENOUS
  Administered 2018-06-24: 8 m[IU]/min via INTRAVENOUS

## 2018-06-24 MED ORDER — ONDANSETRON HCL 4 MG/2ML IJ SOLN: 4.0000 mg | INTRAMUSCULAR | Status: DC | PRN

## 2018-06-24 MED ORDER — MEDROXYPROGESTERONE ACETATE 150 MG/ML IM SUSP: 150.0000 mg | INTRAMUSCULAR | Status: DC | PRN

## 2018-06-24 MED ORDER — ACETAMINOPHEN 325 MG PO TABS: 650.0000 mg | ORAL_TABLET | ORAL | Status: DC | PRN

## 2018-06-24 MED ORDER — PHENYLEPHRINE 40 MCG/ML (10ML) SYRINGE FOR IV PUSH (FOR BLOOD PRESSURE SUPPORT)
80.0000 ug | PREFILLED_SYRINGE | INTRAVENOUS | Status: DC | PRN
  Filled 2018-06-24: qty 5

## 2018-06-24 MED ORDER — SENNOSIDES-DOCUSATE SODIUM 8.6-50 MG PO TABS
2.0000 | ORAL_TABLET | ORAL | Status: DC
  Administered 2018-06-25 – 2018-06-26 (×2): 2 via ORAL
  Filled 2018-06-24 (×2): qty 2

## 2018-06-24 MED ORDER — ZOLPIDEM TARTRATE 5 MG PO TABS: 5.0000 mg | ORAL_TABLET | Freq: Every evening | ORAL | Status: DC | PRN

## 2018-06-24 MED ORDER — OXYCODONE HCL 5 MG PO TABS: 5.0000 mg | ORAL_TABLET | ORAL | Status: DC | PRN

## 2018-06-24 MED ORDER — ONDANSETRON HCL 4 MG PO TABS: 4.0000 mg | ORAL_TABLET | ORAL | Status: DC | PRN

## 2018-06-24 MED ORDER — TETANUS-DIPHTH-ACELL PERTUSSIS 5-2.5-18.5 LF-MCG/0.5 IM SUSP: 0.5000 mL | Freq: Once | INTRAMUSCULAR | Status: DC

## 2018-06-24 MED ORDER — LIDOCAINE HCL (PF) 1 % IJ SOLN
30.0000 mL | INTRAMUSCULAR | Status: AC | PRN
  Administered 2018-06-24: 4 mL via SUBCUTANEOUS

## 2018-06-24 MED ORDER — IBUPROFEN 600 MG PO TABS
600.0000 mg | ORAL_TABLET | Freq: Four times a day (QID) | ORAL | Status: DC
  Administered 2018-06-25 – 2018-06-26 (×7): 600 mg via ORAL
  Filled 2018-06-24 (×7): qty 1

## 2018-06-24 MED ORDER — OXYTOCIN BOLUS FROM INFUSION: 500.0000 mL | Freq: Once | INTRAVENOUS | Status: DC

## 2018-06-24 MED ORDER — ONDANSETRON HCL 4 MG/2ML IJ SOLN
4.0000 mg | Freq: Four times a day (QID) | INTRAMUSCULAR | Status: DC | PRN
  Administered 2018-06-24: 4 mg via INTRAVENOUS
  Filled 2018-06-24: qty 2

## 2018-06-24 MED ORDER — SOD CITRATE-CITRIC ACID 500-334 MG/5ML PO SOLN: 30.0000 mL | ORAL | Status: DC | PRN

## 2018-06-24 MED ORDER — COCONUT OIL OIL
1.0000 "application " | TOPICAL_OIL | Status: DC | PRN
  Administered 2018-06-25: 1 via TOPICAL
  Filled 2018-06-24: qty 120

## 2018-06-24 MED ORDER — FENTANYL 2.5 MCG/ML W/ROPIVACAINE 0.15% IN NS 100 ML EPIDURAL (ARMC)
EPIDURAL | Status: AC
  Filled 2018-06-24: qty 100

## 2018-06-24 MED ORDER — BENZOCAINE-MENTHOL 20-0.5 % EX AERO
1.0000 "application " | INHALATION_SPRAY | CUTANEOUS | Status: DC | PRN
  Administered 2018-06-25: 1 via TOPICAL
  Filled 2018-06-24: qty 56

## 2018-06-24 MED ORDER — LACTATED RINGERS IV SOLN
500.0000 mL | INTRAVENOUS | Status: DC | PRN
  Administered 2018-06-24: 500 mL via INTRAVENOUS

## 2018-06-24 MED ORDER — LIDOCAINE-EPINEPHRINE (PF) 1.5 %-1:200000 IJ SOLN
INTRAMUSCULAR | Status: DC | PRN
  Administered 2018-06-24: 4 mL via EPIDURAL

## 2018-06-24 MED ORDER — DIPHENHYDRAMINE HCL 25 MG PO CAPS: 25.0000 mg | ORAL_CAPSULE | Freq: Four times a day (QID) | ORAL | Status: DC | PRN

## 2018-06-24 MED ORDER — BUPIVACAINE HCL (PF) 0.25 % IJ SOLN
INTRAMUSCULAR | Status: DC | PRN
  Administered 2018-06-24 (×2): 4 mL via EPIDURAL

## 2018-06-24 MED ORDER — PRENATAL MULTIVITAMIN CH
1.0000 | ORAL_TABLET | Freq: Every day | ORAL | Status: DC
  Administered 2018-06-25 – 2018-06-26 (×2): 1 via ORAL
  Filled 2018-06-24 (×2): qty 1

## 2018-06-24 MED ORDER — DIBUCAINE 1 % RE OINT
1.0000 "application " | TOPICAL_OINTMENT | RECTAL | Status: DC | PRN
  Administered 2018-06-25: 1 via RECTAL
  Filled 2018-06-24: qty 28

## 2018-06-24 NOTE — Progress Notes (Signed)
Labor Progress Note  Kathy Benton is a 27 y.o. [id number] at 6714w3d by US admitted for PROM.   Subjective: pt comfortable now s/p epidural.   Objective: BP 113/62   Pulse 64   Temp 98.6 F (37 C) (Oral)   Resp 20   Ht 4\' 11"  (1.499 m)   Wt 80.7 kg   LMP  (LMP Unknown)   SpO2 99%   BMI 35.95 kg/m  Notable VS details: reviewed  Fetal Assessment: FHT:  FHR: 130 bpm, variability: moderate,  accelerations:  Present,  decelerations:  Absent Category/reactivity:  Category I UC:   regular, every 2-3 minutes; pitocin at 6710mu/min SVE:  Dilation: 5 Effacement (%): 70 Cervical Position: Middle Station: -2 Presentation: Vertex Exam by:: Laural BenesB. Nielsen RN  Membrane status: PROM at approx 1500 Amniotic color: clear  Labs: Lab Results  Component Value Date   WBC 10.4 06/24/2018   HGB 12.8 06/24/2018   HCT 36.3 06/24/2018   MCV 89.0 06/24/2018   PLT 297 06/24/2018    Assessment / Plan: Augmentation of labor, progressing well  Labor: progressing on pitocin Preeclampsia:  normotensive Fetal Wellbeing:  Category I Pain Control:  Epidural I/D:  GBS pending Anticipated MOD:  NSVD  Dale Ribeiro A, CNM 06/24/2018, 9:04 PM

## 2018-06-24 NOTE — Progress Notes (Signed)
RN to do patient's fundal assessment. Pt fundus firm, midline, E/U with scant bleeding. Provider instructed RN to straight cath patient. Pt refused. She stated she would get up in 30 minutes to the bathroom once her legs were back to her. Her bleeding was acceptable. Will continue to monitor.

## 2018-06-24 NOTE — Anesthesia Procedure Notes (Addendum)
Epidural Patient location during procedure: OB  Staffing Performed: anesthesiologist   Preanesthetic Checklist Completed: patient identified, site marked, surgical consent, pre-op evaluation, timeout performed, IV checked, risks and benefits discussed and monitors and equipment checked  Epidural Patient position: sitting Prep: Betadine Patient monitoring: heart rate, continuous pulse ox and blood pressure Approach: midline Location: L4-L5 Injection technique: LOR saline  Needle:  Needle type: Tuohy  Needle gauge: 17 G Needle length: 9 cm and 9 Needle insertion depth: 6 cm Catheter type: closed end flexible Catheter size: 19 Gauge Catheter at skin depth: 12 cm Test dose: negative and 1.5% lidocaine with Epi 1:200 K  Assessment Sensory level: T10 Events: blood not aspirated, injection not painful, no injection resistance, negative IV test and no paresthesia  Additional Notes   Patient tolerated the insertion well without complications.-SATD -IVTD. No paresthesia. Refer to [facility] nursing for VS and dosingReason for block:procedure for pain     

## 2018-06-24 NOTE — Discharge Summary (Signed)
Obstetrical Discharge Summary  Patient Name: Kathy Benton DOB: May 14, 1991 MRN: [id number]  Date of Admission: 06/24/2018 Date of Delivery: 06/24/18 Delivered by: Bonnell Public McVey CNM Date of Discharge: 06/24/2018  Primary OB:  [facility]  LMP:No LMP recorded (lmp unknown). EDC Estimated Date of Delivery: 07/05/18 Gestational Age at Delivery: 6523w3d   Antepartum complications:  1. Poor compliance with prenatal care, late to care at 16+6wk, missed appts from 17 to 31wks.  2. GDM- poor compliance- has not checked CBG at home.  3. Obesity, BMI 35 4. Varicella Non-Immune 5. CHTN- no meds, dx 2008  Admitting Diagnosis: PROM, GDM  Secondary Diagnosis: SVD, nuchal cord  Patient Active Problem List   Diagnosis Date Noted  . Gestational diabetes 06/24/2018    Augmentation: Pitocin Complications: None Intrapartum complications/course: Admitted with PROM, augmented with pitocin, epidural for pain mgmt, rapid progression from 5cm to delivered.  Date of Delivery: 06/24/18 Delivered By: Bonnell Public McVey CNM Delivery Type: spontaneous vaginal delivery Anesthesia: epidural Placenta: spontaneous Laceration: right periurethral lac Episiotomy: none Newborn Data: Live born female  Birth Weight: 6 lb 11.6 oz (3050 g) APGAR: 9, 9  Newborn Delivery   Birth date/time:  06/24/2018 22:15:00 Delivery type:  Vaginal, Spontaneous       Postpartum Procedures: none  Post partum course: Stable  Patient had an uncomplicated postpartum course.  By time of discharge on PPD#2, her pain was controlled on oral pain medications; she had appropriate lochia and was ambulating, voiding without difficulty and tolerating regular diet.  She was deemed stable for discharge to home.      Discharge Physical Exam:  BP 111/88   Pulse 90   Temp 98 F (36.7 C) (Oral)   Resp 20   Ht 4\' 11"  (1.499 m)   Wt 80.7 kg   LMP  (LMP Unknown)   SpO2 100%   Breastfeeding? Unknown   BMI 35.95 kg/m   General: NAD CV: RRR, S1S2, No  M/R/G Pulm: CTABL, nl effort, No W/R/R ABD: s/nd/nt, fundus firm and below the umbilicus -3 Lochia: moderate Perineum: well approximated/intact DVT Evaluation: LE non-ttp, no evidence of DVT on exam.  Hemoglobin  Date Value Ref Range Status  06/24/2018 12.8 12.0 - 16.0 g/dL Final   HCT  Date Value Ref Range Status  06/24/2018 36.3 35.0 - 47.0 % Final     Disposition: stable, discharge to home. Baby Feeding: breastmilk as baby is now latching Baby Disposition: home with mom  Rh Immune globulin given: n/a Rubella vaccine given: n/a Varicella vaccine given: non-immune, given prior to DC Tdap vaccine given in PP setting: given at discharge Flu vaccine given in AP or PP setting: n/a  Contraception: Depo prior to DC  Prenatal Labs:  Blood type/Rh  O pos  Antibody screen neg  Rubella Immune  Varicella  Non-Immune  RPR NR  HBsAg Neg  HIV NR  GC neg  Chlamydia neg  1 hour GTT  162  3 hour GTT  [id number]  GBS  pending, PCR swab on admit      Plan:  Kathy PearMelissa Macek was discharged to home in good condition. Follow-up appointment with delivering provider in 6 weeks.  Discharge Medications: Ibuprofen, Prenatal vitamins, iron supplement daily for one to two months  Follow-up Information    McVey, Prudencio Pairebecca A, CNM. Schedule an appointment as soon as possible for a visit in 6 week(s).   Specialty:  Obstetrics and Gynecology Contact information: [address] [place] [address] [phone]  Signed:  Myrtie Cruise, MSN, CNM, FNP Certified Nurse Midwife [facility]

## 2018-06-24 NOTE — H&P (Signed)
OB History & Physical   History of Present Illness:  Chief Complaint: "my water broke"  HPI:  Kathy Benton is Benton 27 y.o. [id number] female at [redacted]w[redacted]d dated by [place] at [redacted]w[redacted]d.  She presents to L&D for ruptured membranes at 1515 today. Active FM; reports irregular UCs with pelvic pressure, denies bloody show.     Pregnancy Issues: 1. Poor compliance with prenatal care, late to care at 16+6wk, missed appts from 17 to 31wks.  2. GDM- poor compliance- has not checked CBG at home.  3. Obesity, BMI 35 4. Varicella Non-Immune 5. CHTN- no meds, dx 2008   Maternal Medical History:  No past medical history on file.  No past surgical history on file.  No Known Allergies  Prior to Admission medications   Medication Sig Start Date End Date Taking? Authorizing Provider  fluticasone (FLONASE) 50 MCG/ACT nasal spray Place 1 spray into both nostrils 2 (two) times daily. Patient not taking: Reported on 06/19/2018 12/09/17   Cuthriell, Delorise Royals, PA-C  metoCLOPramide (REGLAN) 10 MG tablet Take 1 tablet (10 mg total) by mouth 3 (three) times daily with meals. Patient not taking: Reported on 06/19/2018 12/09/17 12/09/18  Cuthriell, Delorise Royals, PA-C  metoCLOPramide (REGLAN) 10 MG tablet Take 1 tablet (10 mg total) by mouth every 6 (six) hours as needed for nausea. Patient not taking: Reported on 06/19/2018 02/20/18 02/20/19  Willy Eddy, MD     [facility]  Social History: She  reports that she has never smoked. She has never used smokeless tobacco. She reports that she does not drink alcohol or use drugs.  Family History: denies fam hx of diabetes, HTN or gyn cancers   Review of Systems: Benton full review of systems was performed and negative except as noted in the HPI.     Physical Exam:  Vital Signs: BP 115/67   Pulse 83   Temp 97.8 F (36.6 C) (Oral)   LMP  (LMP Unknown)  General: no acute distress.  HEENT: normocephalic, atraumatic Heart: regular rate & rhythm.  No  murmurs/rubs/gallops Lungs: clear to auscultation bilaterally, normal respiratory effort Abdomen: soft, gravid, non-tender;  EFW: 8lbs Pelvic:   External: Normal external female genitalia  Cervix: Dilation: 4 / Effacement (%): 50 / Station: -2    Extremities: non-tender, symmetric, no edema bilaterally.  DTRs: 2+  Neurologic: Alert & oriented x 3.    No results found for this or any previous visit (from the past 24 hour(s)).  Pertinent Results:  Prenatal Labs: Blood type/Rh  O pos  Antibody screen neg  Rubella Immune  Varicella  Non-Immune  RPR NR  HBsAg Neg  HIV NR  GC neg  Chlamydia neg  1 hour GTT  162  3 hour GTT  [id number]  GBS  not done, PCR swab on admit   FHT: 145bpm, mod variability, + accels, no decels TOCO: Irregular, mild SVE:  Dilation: 4 / Effacement (%): 50 / Station: -2    Cephalic by leopolds  No results found.  Assessment:  Kathy Benton is Benton 27 y.o. [id number] female at [redacted]w[redacted]d with PROM and GDM.  Plan:  1. Admit to Labor & Delivery; consents reviewed and obtained  2. Fetal Well being  - Fetal Tracing: Cat I tracing - Group B Streptococcus ppx indicated: neg per risk factors, PCR swab to be done now.  - Presentation: cephalic confirmed by exam and leopolds   3. Routine OB: - Prenatal labs reviewed, as above - Rh O Pos -  CBC, T&S, RPR on admit - Clear fluids, IVF - GDMA1: check CBG q4hrs until active labor.   4. Monitoring of Labor:  -  Contractions: external toco in place -  Pelvis proven to 7#6 -  Plan for induction with Pitocin.  -  Plan for continuous fetal monitoring  -  Maternal pain control as desired; reviewed options of IVPM, nitrous, regional anesthesia - Anticipate vaginal delivery  5. Post Partum Planning: - Infant feeding: both - Contraception: undecided  Kathy Benton, CNM 06/24/18 4:29 PM

## 2018-06-24 NOTE — Anesthesia Preprocedure Evaluation (Signed)

## 2018-06-25 LAB — CBC
HEMATOCRIT: 34.4 % — AB (ref 35.0–47.0)
Hemoglobin: 11.8 g/dL — ABNORMAL LOW (ref 12.0–16.0)
MCH: 30.8 pg (ref 26.0–34.0)
MCHC: 34.4 g/dL (ref 32.0–36.0)
MCV: 89.6 fL (ref 80.0–100.0)
Platelets: 241 10*3/uL (ref 150–440)
RBC: 3.83 MIL/uL (ref 3.80–5.20)
RDW: 14.3 % (ref 11.5–14.5)
WBC: 11.5 10*3/uL — AB (ref 3.6–11.0)

## 2018-06-25 LAB — GLUCOSE, CAPILLARY
Glucose-Capillary: 70 mg/dL (ref 70–99)
Glucose-Capillary: 72 mg/dL (ref 70–99)

## 2018-06-25 LAB — GROUP B STREP BY PCR: GROUP B STREP BY PCR: NEGATIVE

## 2018-06-25 MED ORDER — VARICELLA VIRUS VACCINE LIVE 1350 PFU/0.5ML IJ SUSR
0.5000 mL | Freq: Once | INTRAMUSCULAR | Status: AC
  Filled 2018-06-25: qty 0.5

## 2018-06-25 NOTE — Progress Notes (Signed)
  June 25, 2018  Patient: Kathy Benton [place]  Date of Birth: August 29, 1991  Date of Visit: 06/24/2018    To Whom It May Concern:  Kathy Benton Yeomans and baby girl were admitted to the [facility] on 06/24/2018 care center on 06/24/2018. Heron SabinsJose Vega was present throughout the delivery and aftercare as a support person. Kathy Benton Frenz will be discharged on 06/26/2018.   Sincerely,    Frann RiderLeslie Royer Cristobal, RN [facility] [phone]

## 2018-06-25 NOTE — Lactation Note (Signed)
This note was copied from a baby's chart. Lactation Consultation Note  Patient Name: Kathy Benton OZHYQ'MToday's Date: 06/25/2018 Reason for consult: Follow-up assessment;Early term 37-38.6wks;Difficult latch(Difficulty latching d/t giving bottles) Have been working all day to get [name] to latch to the breast after mom gave bottles of formula.  It is a challenge to get her to open up her mouth wide enough to get her to latch on the breast.  When she does get on the breast, the latch is shallow and she just goes back to sleep.  After discouraging mom from giving any more bottles explaining risks of nipple confusion and supply and demand, mom gave another bottle without calling LC for assistance because she had visitors.  After several unsuccessful attempts at breast feeding after 2 bottles of formula, we finally put a nipple shield on the breast.  She would latch with #20 nipple shield, but still kept going to sleep.  After putting some formula in the tip of the nipple shield, she finally started strong rhythmic sucking.  Mom could feel strong tugs at the breast.  Mom said this is the way she fed before giving the bottles.  Discouraged any more bottles of formula.  Encouraged to try without nipple shield and if could get to latch to try the nipple shield with a few drops, preferably colostrum, in the tip.  Symphony DEBP set up to use through the night if poor or no breast feeding for stimulation.  Hand expression also encouraged.  Reviewed normal course of lactation and routine newborn feeding patterns.  Lactation name and number has been on white board all day.  Encouraged to call nurse for assistance through the night and LC would be at the number on the board in the am.    Maternal Data Formula Feeding for Exclusion: No Has patient been taught Hand Expression?: Yes Does the patient have breastfeeding experience prior to this delivery?: Yes  Feeding Feeding Type: Breast Fed Length of feed: 20  min  LATCH Score Latch: Repeated attempts needed to sustain latch, nipple held in mouth throughout feeding, stimulation needed to elicit sucking reflex.  Audible Swallowing: A few with stimulation  Type of Nipple: Everted at rest and after stimulation  Comfort (Breast/Nipple): Soft / non-tender  Hold (Positioning): Assistance needed to correctly position infant at breast and maintain latch.  LATCH Score: 7  Interventions Interventions: Assisted with latch;Breast compression;Coconut oil;Skin to skin;Adjust position;Breast massage;Support pillows;Position options;Pre-pump if needed;DEBP  Lactation Tools Discussed/Used Tools: Coconut oil;Nipple Dorris CarnesShields;Other (comment)(Curved tip syringe to put formula in tip of NS to get baby to latch) Nipple shield size: 20 WIC Program: Yes Pump Review: Setup, frequency, and cleaning;Milk Storage;Other (comment) Initiated by:: S.Kyaire Gruenewald,RN,BSN,IBCLC Date initiated:: 06/25/18   Consult Status Consult Status: Follow-up Follow-up type: Call as needed    Louis MeckelWilliams, Yaeko Fazekas Kay 06/25/2018, 8:29 PM

## 2018-06-25 NOTE — Progress Notes (Signed)
Post Partum Day 1 Subjective: Doing well, no complaints.  Tolerating regular diet, pain with PO meds, voiding and ambulating without difficulty.  No CP SOB Fever,Chills, N/V or leg pain; denies nipple or breast pain; no HA change of vision, RUQ/epigastric pain  Objective: BP 95/64 (BP Location: Left Arm)   Pulse 60   Temp 98.4 F (36.9 C) (Oral)   Resp 18   Ht 4\' 11"  (1.499 m)   Wt 80.7 kg   LMP  (LMP Unknown)   SpO2 100%   Breastfeeding? Unknown   BMI 35.95 kg/m    Physical Exam:  General: NAD Breasts: soft/nontender CV: RRR Pulm: nl effort, CTABL Abdomen: soft, NT, BS x 4 Perineum: minimal edema, intact Lochia: small Uterine Fundus: fundus firm and 1 fb below umbilicus DVT Evaluation: no cords, ttp LEs   Recent Labs    06/24/18 1651 06/25/18 0636  HGB 12.8 11.8*  HCT 36.3 34.4*  WBC 10.4 11.5*  PLT 297 241    Assessment/Plan: 27 y.o. [id number] postpartum day # 1  - Continue routine PP care - Lactation consult  - Discussed contraceptive options including implant, IUDs hormonal and non-hormonal, injection, pills/ring/patch, condoms, and NFP.- wants Depo  - Mild anemia - hemodynamically stable and asymptomatic; continue PNV daily - Immunization status: Needs varicella prior to DC   Disposition: Does not desire Dc home today.    McVey, REBECCA A, CNM 06/25/2018  10:43 AM

## 2018-06-26 LAB — RPR: RPR: NONREACTIVE

## 2018-06-26 MED ORDER — MEDROXYPROGESTERONE ACETATE 150 MG/ML IM SUSP
150.0000 mg | Freq: Once | INTRAMUSCULAR | Status: AC
  Administered 2018-06-26: 150 mg via INTRAMUSCULAR
  Filled 2018-06-26: qty 1

## 2018-06-26 MED ORDER — TETANUS-DIPHTH-ACELL PERTUSSIS 5-2.5-18.5 LF-MCG/0.5 IM SUSP
0.5000 mL | Freq: Once | INTRAMUSCULAR | Status: AC
  Administered 2018-06-26: 0.5 mL via INTRAMUSCULAR
  Filled 2018-06-26: qty 0.5

## 2018-06-26 MED ORDER — VARICELLA VIRUS VACCINE LIVE 1350 PFU/0.5ML IJ SUSR
0.5000 mL | Freq: Once | INTRAMUSCULAR | Status: AC
  Administered 2018-06-26: 0.5 mL via SUBCUTANEOUS
  Filled 2018-06-26: qty 0.5

## 2018-06-26 NOTE — Clinical Social Work Note (Signed)
CSW consulted for limited prenatal care. CSW has noted MD documentation that she had late entry into care at 16 weeks and had some missed appointments. CSW has asked nursing to inquire if she will have transportation to make necessary follow up newborn medical visits. Nurse, Angelica ChessmanMandy, is aware to contact CSW if there are transportation issues prior to discharge today. York SpanielMonica Arah Aro MSW,LCSW [phone]

## 2018-06-26 NOTE — Discharge Instructions (Signed)
Breastfeeding Challenges and Solutions °Even though breastfeeding is natural, it can be challenging, especially in the first few weeks after childbirth. It is normal for problems to arise when starting to breastfeed your new baby, even if you have breastfed before. This document provides some solutions to the most common breastfeeding challenges. °Challenges and solutions °Challenge--Cracked or Sore Nipples °Cracked or sore nipples are commonly experienced by breastfeeding mothers. Cracked or sore nipples often are caused by inadequate latching (when your baby's mouth attaches to your breast to breastfeed). Soreness can also happen if your baby is not positioned properly at your breast. Although nipple cracking and soreness are common during the first week after birth, nipple pain is never normal. If you experience nipple cracking or soreness that lasts longer than 1 week or nipple pain, call your health care provider or lactation consultant. °Solution °Ensure proper latching and positioning of your baby by following the steps below: °· Find a comfortable place to sit or lie down, with your neck and back well supported. °· Place a pillow or rolled up blanket under your baby to bring him or her to the level of your breast (if you are seated). °· Make sure that your baby's abdomen is facing your abdomen. °· Gently massage your breast. With your fingertips, massage from your chest wall toward your nipple in a circular motion. This encourages milk flow. You may need to continue this action during the feeding if your milk flows slowly. °· Support your breast with 4 fingers underneath and your thumb above your nipple. Make sure your fingers are well away from your nipple and your baby’s mouth. °· Stroke your baby's lips gently with your finger or nipple. °· When your baby's mouth is open wide enough, quickly bring your baby to your breast, placing your entire nipple and as much of the colored area around your nipple  (areola) as possible into your baby's mouth. °? More areola should be visible above your baby's upper lip than below the lower lip. °? Your baby's tongue should be between his or her lower gum and your breast. °· Ensure that your baby's mouth is correctly positioned around your nipple (latched). Your baby's lips should create a seal on your breast and be turned out (everted). °· It is common for your baby to suck for about 2-3 minutes in order to start the flow of breast milk. ° °Signs that your baby has successfully latched on to your nipple include: °· Quietly tugging or quietly sucking without causing you pain. °· Swallowing heard between every 3-4 sucks. °· Muscle movement above and in front of his or her ears with sucking. ° °Signs that your baby has not successfully latched on to nipple include: °· Sucking sounds or smacking sounds from your baby while nursing. °· Nipple pain. ° °Ensure that your breasts stay moisturized and healthy by: °· Avoiding the use of soap on your nipples. °· Wearing a supportive bra. Avoid wearing underwire-style bras or tight bras. °· Air drying your nipples for 3-4 minutes after each feeding. °· Using only cotton bra pads to absorb breast milk leakage. Leaking of breast milk between feedings is normal. Be sure to change the pads if they become soaked with milk. °· Using lanolin on your nipples after nursing. Lanolin helps to maintain your skin's normal moisture barrier. If you use pure lanolin you do not need to wash it off before feeding your baby again. Pure lanolin is not toxic to your baby. You may also   hand express a few drops of breast milk and gently massage that milk into your nipples, allowing it to air dry. ° °Challenge--Breast Engorgement °Breast engorgement is the overfilling of your breasts with breast milk. In the first few weeks after giving birth, you may experience breast engorgement. Breast engorgement can make your breasts throb and feel hard, tightly stretched,  warm, and tender. Engorgement peaks about the fifth day after you give birth. Having breast engorgement does not mean you have to stop breastfeeding your baby. °Solution °· Breastfeed when you feel the need to reduce the fullness of your breasts or when your baby shows signs of hunger. This is called "breastfeeding on demand." °· Newborns (babies younger than 4 weeks) often breastfeed every 1-3 hours during the day. You may need to awaken your baby to feed if he or she is asleep at a feeding time. °· Do not allow your baby to sleep longer than 5 hours during the night without a feeding. °· Pump or hand express breast milk before breastfeeding to soften your breast, areola, and nipple. °· Apply warm, moist heat (in the shower or with warm water-soaked hand towels) just before feeding or pumping, or massage your breast before or during breastfeeding. This increases circulation and helps your milk to flow. °· Completely empty your breasts when breastfeeding or pumping. Afterward, wear a snug bra (nursing or regular) or tank top for 1-2 days to signal your body to slightly decrease milk production. Only wear snug bras or tank tops to treat engorgement. Tight bras typically should be avoided by breastfeeding mothers. Once engorgement is relieved, return to wearing regular, loose-fitting clothes. °· Apply ice packs to your breasts to lessen the pain from engorgement and relieve swelling, unless the ice is uncomfortable for you. °· Do not delay feedings. Try to relax when it is time to feed your baby. This helps to trigger your "let-down reflex," which releases milk from your breast. °· Ensure your baby is latched on to your breast and positioned properly while breastfeeding. °· Allow your baby to remain at your breast as long as he or she is latched on well and actively sucking. Your baby will let you know when he or she is done breastfeeding by pulling away from your breast or falling asleep. °· Avoid introducing bottles  or pacifiers to your baby in the early weeks of breastfeeding. Wait to introduce these things until after resolving any breastfeeding challenges. °· Try to pump your milk on the same schedule as when your baby would breastfeed if you are returning to work or away from home for an extended period. °· Drink plenty of fluids to avoid dehydration, which can eventually put you at greater risk of breast engorgement. ° °If you follow these suggestions, your engorgement should improve in 24-48 hours. If you are still experiencing difficulty, call your lactation consultant or health care provider. °Challenge--Plugged Milk Ducts °Plugged milk ducts occur when the duct does not drain milk effectively and becomes swollen. Wearing a tight-fitting nursing bra or having difficulty with latching may cause plugged milk ducts. Not drinking enough water (8-10 c [1.9-2.4 L] per day) can contribute to plugged milk ducts. Once a duct has become plugged, hard lumps, soreness, and redness may develop in your breast. °Solution °Do not delay feedings. Feed your baby frequently and try to empty your breasts of milk at each feeding. Try breastfeeding from the affected side first so there is a better chance that the milk will drain completely from   that breast. Apply warm, moist towels to your breasts for 5-10 minutes before feeding. Alternatively, a hot shower right before breastfeeding can provide the moist heat that can encourage milk flow. Gentle massage of the sore area before and during a feeding may also help. Avoid wearing tight clothing or bras that put pressure on your breasts. Wear bras that offer good support to your breasts, but avoid underwire bras. If you have a plugged milk duct and develop a fever, you need to see your health care provider. °Challenge--Mastitis °Mastitis is inflammation of your breast. It usually is caused by a bacterial infection and can cause flu-like symptoms. You may develop redness in your breast and a  fever. Often when mastitis occurs, your breast becomes firm, warm, and very painful. The most common causes of mastitis are poor latching, ineffective sucking from your baby, consistent pressure on your breast (possibly from wearing a tight-fitting bra or shirt that restricts the milk flow), unusual stress or fatigue, or missed feedings. °Solution °You will be given antibiotic medicine to treat the infection. It is still important to breastfeed frequently to empty your breasts. Continuing to breastfeed while you recover from mastitis will not harm your baby. Make sure your baby is positioned properly during every feeding. Apply moist heat to your breasts for a few minutes before feeding to help the milk flow and to help your breasts empty more easily. °Challenge--Thrush °Thrush is a yeast infection that can form on your nipples, in your breast, or in your baby's mouth. It causes itching, soreness, burning or stabbing pain, and sometimes a rash. °Solution °You will be given a medicated ointment for your nipples, and your baby will be given a liquid medicine for his or her mouth. It is important that you and your baby are treated at the same time because thrush can be passed between you and your baby. Change disposable nursing pads often. Any bras, towels, or clothing that come in contact with infected areas of your body or your baby's body need to be washed in very hot water every day. Wash your hands and your baby's hands often. All pacifiers, bottle nipples, or toys your baby puts in his or her mouth should be boiled once a day for 20 minutes. After 1 week of treatment, discard pacifiers and bottle nipples and buy new ones. All breast pump parts that touch the milk need to be boiled for 20 minutes every day. °Challenge--Low Milk Supply °You may not be producing enough milk if your baby is not gaining the proper amount of weight. Breast milk production is based on a supply-and-demand system. Your milk supply depends  on how frequently and effectively your baby empties your breast. °Solution °The more you breastfeed and pump, the more breast milk you will produce. It is important that your baby empties at least one of your breasts at each feeding. If this is not happening, then use a breast pump or hand express any milk that remains. This will help to drain as much milk as possible at each feeding. It will also signal your body to produce more milk. If your baby is not emptying your breasts, it may be due to latching, sucking, or positioning problems. If low milk supply continues after addressing these issues, contact your health care provider or a lactation specialist as soon as possible. °Challenge--Inverted or Flat Nipples °Some women have nipples that turn inward instead of protruding outward. Other women have nipples that are flat. Inverted or flat nipples can   sometimes make it more difficult for your baby to latch onto your breast. °Solution °You may be given a small device that pulls out inverted nipples. This device should be applied right before your baby is brought to your breast. You can also try using a breast pump for a short time before placing the baby at your breast. The pump can pull your nipple outwards to help your infant latch more easily. The baby's sucking motion will help the inverted nipple protrude as well. °If you have flat nipples, encourage your baby to latch onto your breast and feed frequently in the early days after birth. This will give your baby practice latching on correctly while your breast is still soft. When your milk supply increases, between the second and fifth day after birth and your breasts become full, your baby will have an easier time latching. °Contact a lactation consultant if you still have concerns. She or he can teach you additional techniques to address breastfeeding problems related to nipple shape and position. °Where to find more information: °[employer]:  www.llli.org °This information is not intended to replace advice given to you by your health care provider. Make sure you discuss any questions you have with your health care provider. °Document Released: 04/11/2006 Document Revised: 03/31/2016 Document Reviewed: 04/13/2013 °Elsevier Interactive Patient Education © 2017 [employer] °Vaginal Delivery, Care After °Refer to this sheet in the next few weeks. These instructions provide you with information about caring for yourself after vaginal delivery. Your health care provider may also give you more specific instructions. Your treatment has been planned according to current medical practices, but problems sometimes occur. Call your health care provider if you have any problems or questions. °What can I expect after the procedure? °After vaginal delivery, it is common to have: °· Some bleeding from your vagina. °· Soreness in your abdomen, your vagina, and the area of skin between your vaginal opening and your anus (perineum). °· Pelvic cramps. °· Fatigue. ° °Follow these instructions at home: °Medicines °· Take over-the-counter and prescription medicines only as told by your health care provider. °· If you were prescribed an antibiotic medicine, take it as told by your health care provider. Do not stop taking the antibiotic until it is finished. °Driving ° °· Do not drive or operate heavy machinery while taking prescription pain medicine. °· Do not drive for 24 hours if you received a sedative. °Lifestyle °· Do not drink alcohol. This is especially important if you are breastfeeding or taking medicine to relieve pain. °· Do not use tobacco products, including cigarettes, chewing tobacco, or e-cigarettes. If you need help quitting, ask your health care provider. °Eating and drinking °· Drink at least 8 eight-ounce glasses of water every day unless you are told not to by your health care provider. If you choose to breastfeed your baby, you may need to drink more  water than this. °· Eat high-fiber foods every day. These foods may help prevent or relieve constipation. High-fiber foods include: °? Whole grain cereals and breads. °? Brown rice. °? Beans. °? Fresh fruits and vegetables. °Activity °· Return to your normal activities as told by your health care provider. Ask your health care provider what activities are safe for you. °· Rest as much as possible. Try to rest or take a nap when your baby is sleeping. °· Do not lift anything that is heavier than your baby or 10 lb (4.5 kg) until your health care provider says that it   is safe. °· Talk with your health care provider about when you can engage in sexual activity. This may depend on your: °? Risk of infection. °? Rate of healing. °? Comfort and desire to engage in sexual activity. °Vaginal Care °· If you have an episiotomy or a vaginal tear, check the area every day for signs of infection. Check for: °? More redness, swelling, or pain. °? More fluid or blood. °? Warmth. °? Pus or a bad smell. °· Do not use tampons or douches until your health care provider says this is safe. °· Watch for any blood clots that may pass from your vagina. These may look like clumps of dark red, brown, or black discharge. °General instructions °· Keep your perineum clean and dry as told by your health care provider. °· Wear loose, comfortable clothing. °· Wipe from front to back when you use the toilet. °· Ask your health care provider if you can shower or take a bath. If you had an episiotomy or a perineal tear during labor and delivery, your health care provider may tell you not to take baths for a certain length of time. °· Wear a bra that supports your breasts and fits you well. °· If possible, have someone help you with household activities and help care for your baby for at least a few days after you leave the hospital. °· Keep all follow-up visits for you and your baby as told by your health care provider. This is important. °Contact a  health care provider if: °· You have: °? Vaginal discharge that has a bad smell. °? Difficulty urinating. °? Pain when urinating. °? A sudden increase or decrease in the frequency of your bowel movements. °? More redness, swelling, or pain around your episiotomy or vaginal tear. °? More fluid or blood coming from your episiotomy or vaginal tear. °? Pus or a bad smell coming from your episiotomy or vaginal tear. °? A fever. °? A rash. °? Little or no interest in activities you used to enjoy. °? Questions about caring for yourself or your baby. °· Your episiotomy or vaginal tear feels warm to the touch. °· Your episiotomy or vaginal tear is separating or does not appear to be healing. °· Your breasts are painful, hard, or turn red. °· You feel unusually sad or worried. °· You feel nauseous or you vomit. °· You pass large blood clots from your vagina. If you pass a blood clot from your vagina, save it to show to your health care provider. Do not flush blood clots down the toilet without having your health care provider look at them. °· You urinate more than usual. °· You are dizzy or light-headed. °· You have not breastfed at all and you have not had a menstrual period for 12 weeks after delivery. °· You have stopped breastfeeding and you have not had a menstrual period for 12 weeks after you stopped breastfeeding. °Get help right away if: °· You have: °? Pain that does not go away or does not get better with medicine. °? Chest pain. °? Difficulty breathing. °? Blurred vision or spots in your vision. °? Thoughts about hurting yourself or your baby. °· You develop pain in your abdomen or in one of your legs. °· You develop a severe headache. °· You faint. °· You bleed from your vagina so much that you fill two sanitary pads in one hour. °This information is not intended to replace advice given to you by your health care   provider. Make sure you discuss any questions you have with your health care provider. °Document  Released: 10/15/2000 Document Revised: 03/31/2016 Document Reviewed: 11/02/2015 °Elsevier Interactive Patient Education © 2018 [employer] Inc. ° °

## 2018-06-26 NOTE — Anesthesia Postprocedure Evaluation (Signed)
Anesthesia Post Note  Patient: [name]  Procedure(s) Performed: AN AD HOC LABOR EPIDURAL  Patient location during evaluation: [facility] Anesthesia Type: Epidural Level of consciousness: awake, awake and alert, oriented and patient cooperative Pain management: pain level controlled Vital Signs Assessment: post-procedure vital signs reviewed and stable Respiratory status: spontaneous breathing and nonlabored ventilation Cardiovascular status: stable Postop Assessment: no headache, no backache, patient able to bend at knees, no apparent nausea or vomiting, adequate PO intake and able to ambulate Anesthetic complications: no     Last Vitals:  Vitals:   06/26/18 0014 06/26/18 0738  BP: 114/71 108/73  Pulse: 73 74  Resp: 20 18  Temp: 36.5 C 36.6 C  SpO2: 98% 100%    Last Pain:  Vitals:   06/26/18 0738  TempSrc: Oral  PainSc:                  Lyn RecordsNoles,  Kathy Benton

## 2018-06-26 NOTE — Progress Notes (Signed)
Provided and reviewed AVS with patient. Verified understanding by use of teach back method. Pt verbalized understanding as well. Follow up appointment provided. To be discharged with infant, family member to transport home.

## 2019-01-15 IMAGING — US US OB TRANSVAGINAL
1 series · 13 of 28 positions shown · non-contrast
Comparison: None in PACs

CLINICAL DATA: Right lower quadrant pain for the past week.
Positive beta HCG of [DATE]. The patient is unsure of the last
menstrual.

EXAM:
OBSTETRIC <14 WK US AND TRANSVAGINAL OB US
TECHNIQUE: Both transabdominal and transvaginal ultrasound examinations were
performed for complete evaluation of the gestation as well as the
maternal uterus, adnexal regions, and pelvic cul-de-sac.
Transvaginal technique was performed to assess early pregnancy.

[Series 1: us ob transvaginal · 0.12mm/px · 13 of 84 slices shown]
[im 4/84]
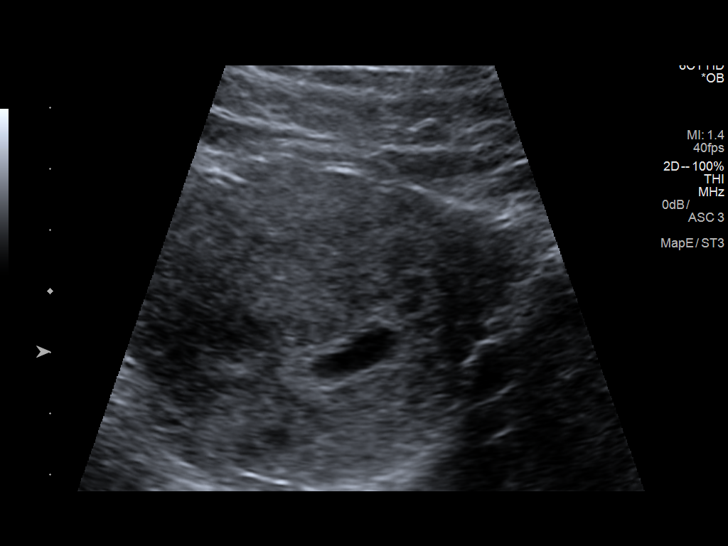
[im 10/84]
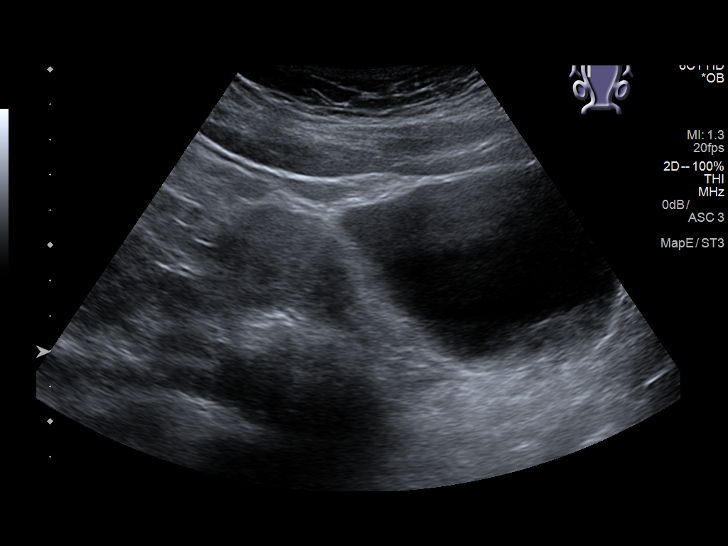
[im 16/84]
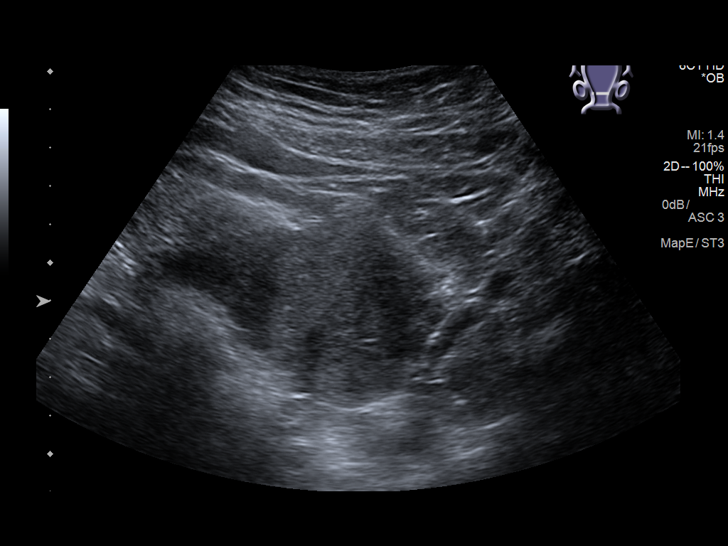
[im 22/84]
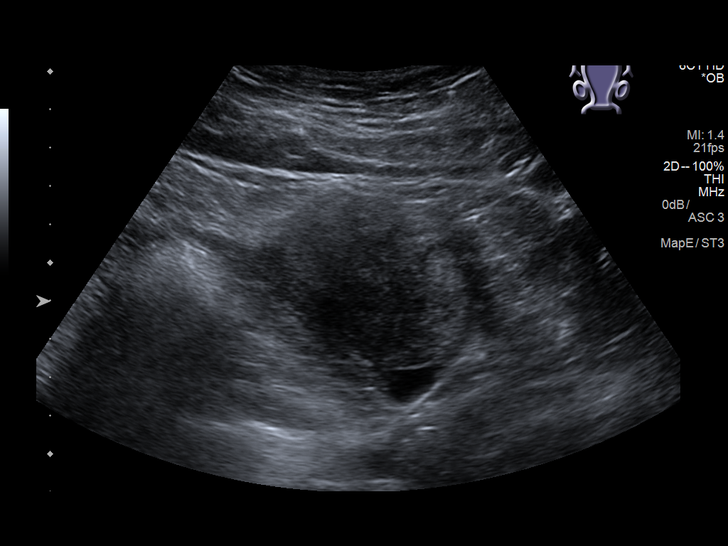
[im 28/84]
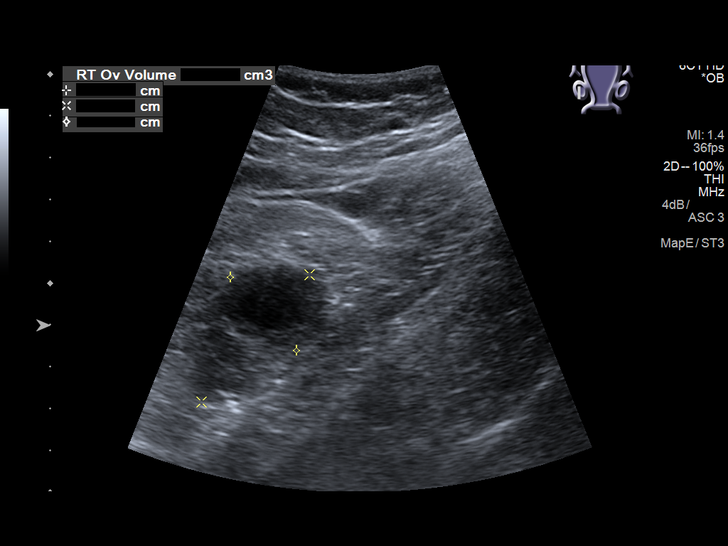
[im 34/84]
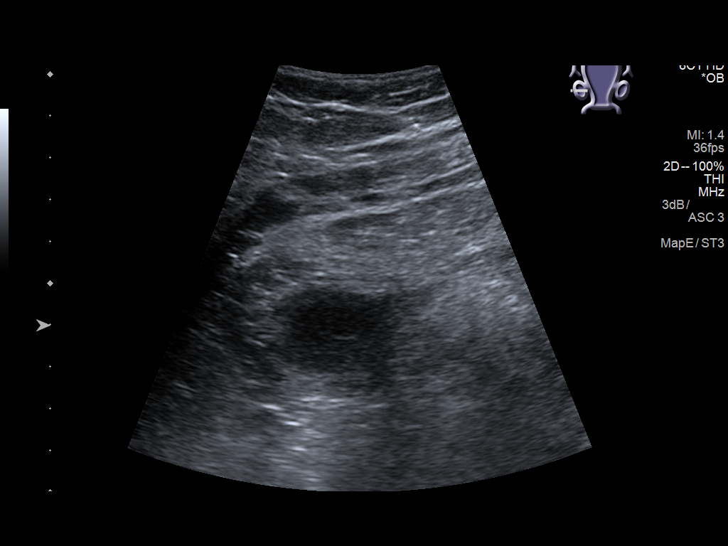
[im 44/84]
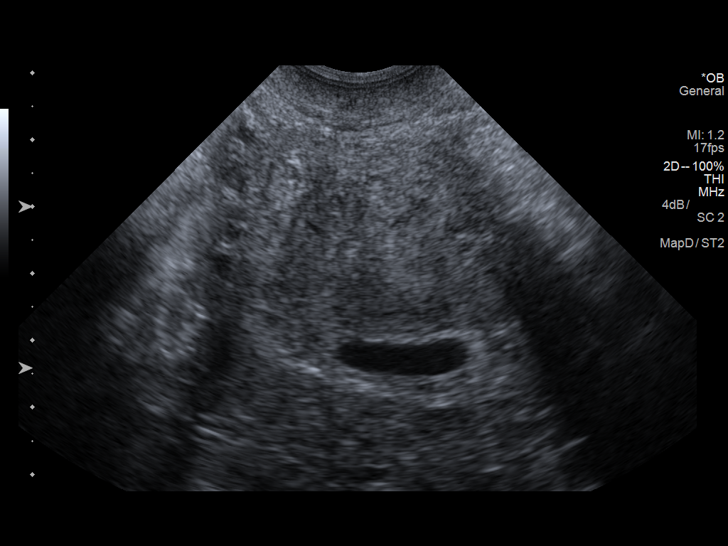
[im 50/84]
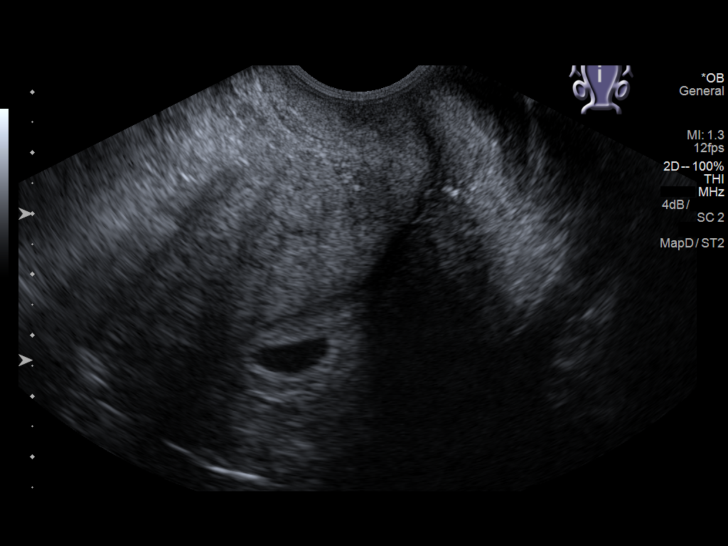
[im 56/84]
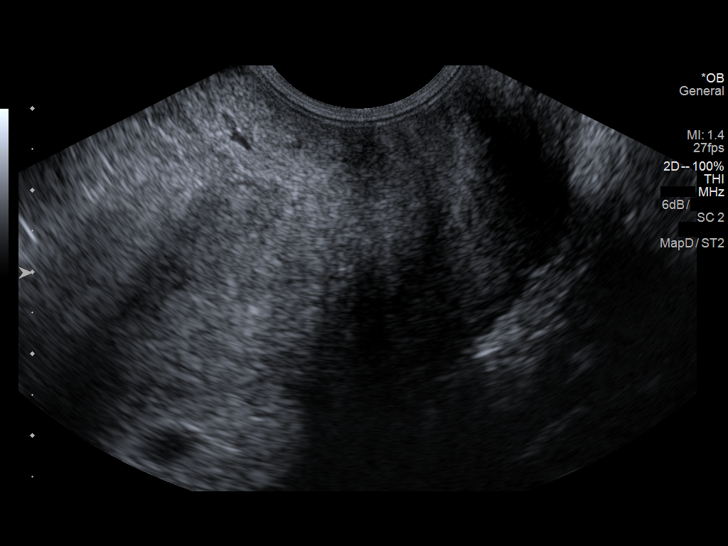
[im 62/84]
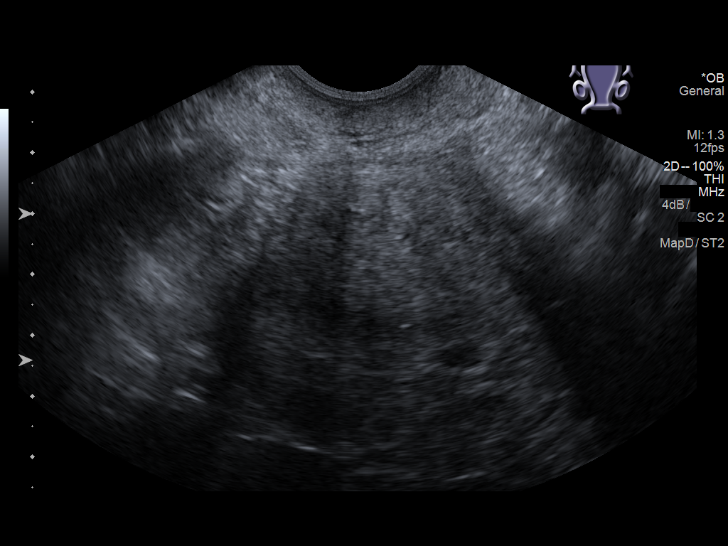
[im 68/84]
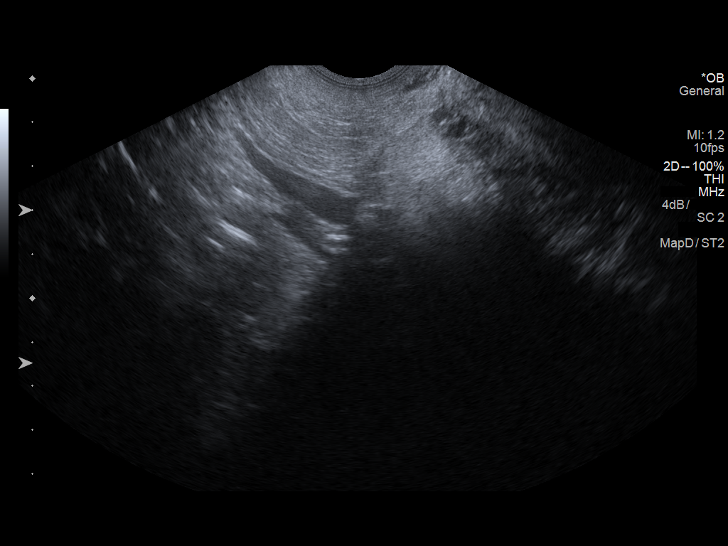
[im 74/84]
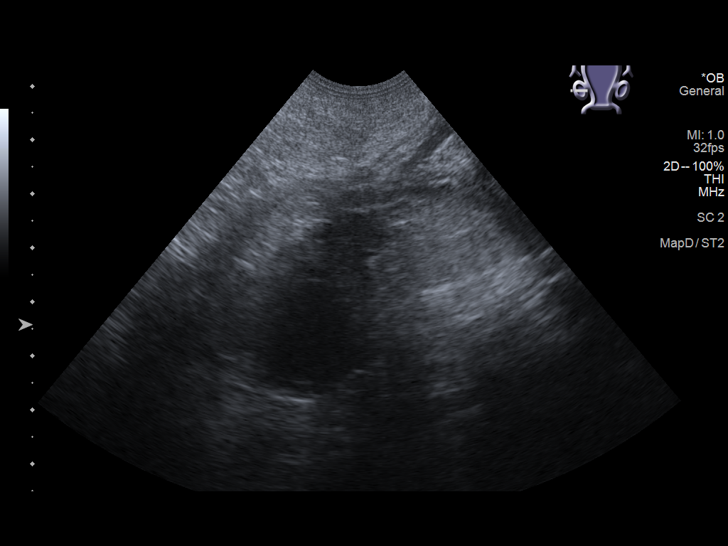
[im 80/84]
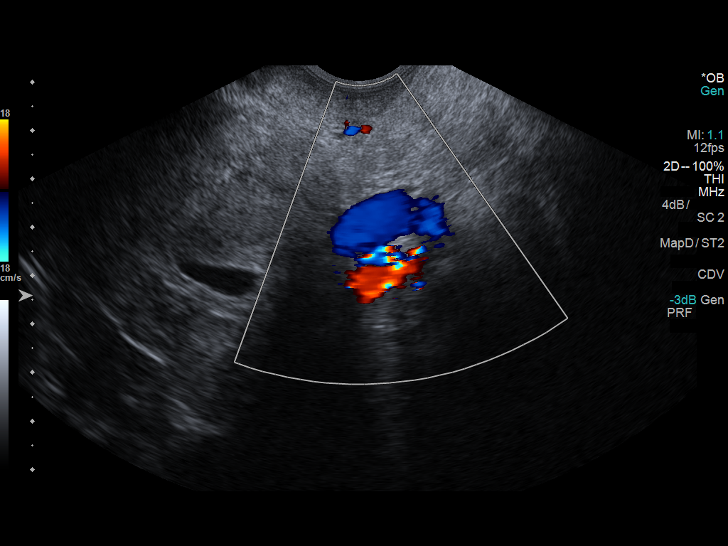

[13 of 28 positions shown; findings below may reference images not displayed]

FINDINGS: Intrauterine gestational sac: Single

Yolk sac:  None

Embryo:  None

Cardiac Activity: None

Heart Rate: None bpm

MSD: 12.3 mm   6 w   0 d

Subchorionic hemorrhage:  None visualize

Maternal uterus/adnexae: There is a 1.9 x 1.6 x 2.1 cm simple
appearing cystic structure in the right ovary most compatible with a
corpus luteum cyst. It is not hypervascular. The left ovary was not
visualized.
IMPRESSION: Probable early intrauterine gestational sac, but no yolk sac, fetal
pole, or cardiac activity yet visualized. Recommend follow-up
quantitative B-HCG levels and follow-up US in 14 days to assess
viability. This recommendation follows SRU consensus guidelines:
Diagnostic Criteria for Nonviable Pregnancy Early in the First
Trimester. N Engl J Med 7529; [DATE].

Probable corpus luteum cyst in the right ovary. No definite ectopic
pregnancy.

## 2019-06-15 DIAGNOSIS — I10 Essential (primary) hypertension: Secondary | ICD-10-CM

## 2019-06-15 DIAGNOSIS — Z6835 Body mass index (BMI) 35.0-35.9, adult: Secondary | ICD-10-CM

## 2019-06-15 DIAGNOSIS — E669 Obesity, unspecified: Secondary | ICD-10-CM

## 2019-06-18 ENCOUNTER — Ambulatory Visit: Payer: Self-pay

## 2019-06-29 ENCOUNTER — Encounter: Payer: Self-pay | Admitting: Nurse Practitioner

## 2019-06-29 ENCOUNTER — Other Ambulatory Visit: Payer: Self-pay

## 2019-06-29 ENCOUNTER — Ambulatory Visit (LOCAL_COMMUNITY_HEALTH_CENTER): Payer: Self-pay | Admitting: Nurse Practitioner

## 2019-06-29 DIAGNOSIS — Z30013 Encounter for initial prescription of injectable contraceptive: Secondary | ICD-10-CM

## 2019-06-29 DIAGNOSIS — Z3042 Encounter for surveillance of injectable contraceptive: Secondary | ICD-10-CM

## 2019-06-29 DIAGNOSIS — Z3009 Encounter for other general counseling and advice on contraception: Secondary | ICD-10-CM

## 2019-06-29 MED ORDER — MEDROXYPROGESTERONE ACETATE 150 MG/ML IM SUSP
150.0000 mg | Freq: Once | INTRAMUSCULAR | Status: AC
  Administered 2019-06-29: 14:00:00 150 mg via INTRAMUSCULAR

## 2019-06-29 NOTE — Progress Notes (Signed)
[place] problem visit  [place] [facility]  Subjective:  Kathy Benton is a 28 y.o. being seen today for   Chief Complaint  Patient presents with  . Contraception    Desires to discuss changing BCM    Currently using DMPA - last injection 03/21/2019 - currently due  RTC in 11-13 wks for implant    Does the patient have a current or past history of drug use? No   No components found for: HCV]   Health Maintenance Due  Topic Date Due  . INFLUENZA VACCINE  06/02/2019    Review of Systems  All other systems reviewed and are negative.   The following portions of the patient's history were reviewed and updated as appropriate: allergies, current medications, past family history, past medical history, past social history, past surgical history and problem list. Problem list updated.   See flowsheet for other program required questions.  Objective:  There were no vitals filed for this visit.  Physical Exam Vitals signs reviewed.  Constitutional:      Appearance: Normal appearance. She is well-developed and normal weight.  Pulmonary:     Effort: Pulmonary effort is normal.  Skin:    General: Skin is warm and dry.  Neurological:     Mental Status: She is alert.  Psychiatric:        Behavior: Behavior is cooperative.       Assessment and Plan:  Kathy Benton is a 28 y.o. female presenting to the [facility] Department for a Women's Health problem visit  There are no diagnoses linked to this encounter.   1. Family planning, Depo-Provera contraception monitoring/administration Client given - medroxyPROGESTERone (DEPO-PROVERA) injection 150 mg  2. Family planning Please continue to monitor  Return for 11-13 weeks for DMPA, or Nexplanon insertion.  No future appointments.  Berniece Andreas, NP

## 2019-08-23 ENCOUNTER — Telehealth: Payer: Self-pay | Admitting: Family Medicine

## 2019-08-23 NOTE — Telephone Encounter (Signed)
WANTS IUD INSERTION, CURRENTLY ON DEPO

## 2019-08-24 NOTE — Telephone Encounter (Signed)
Returned patient phone call. No answer, unable to leave a voicemail due to "no voicemail box has not been set up yet." Hal Morales, RN

## 2019-08-27 NOTE — Telephone Encounter (Signed)
This RN called pt at phone # provided. Pt reports that she is currently on Depo and last Depo was 06/29/2019, so pt is 8 weeks and 3 days post last Depo, so pt is still protected from pregnancy with Depo. Pt reports she would like to get the Paragard IUD. RN counseling provided for Paragard IUD and pt states understanding. Pt scheduled for IUD insertion for 09/04/2019 at 10:20am per pt request. Pt aware of appt date and time and to arrive early for check-in. Pt with no other questions or concerns at this time.Ronny Bacon, RN

## 2019-09-04 ENCOUNTER — Ambulatory Visit: Payer: Self-pay

## 2019-09-18 ENCOUNTER — Telehealth: Payer: Self-pay | Admitting: Family Medicine

## 2019-09-18 NOTE — Telephone Encounter (Signed)
This RN called pt back at phone # provided. Pt reports she wants to come in for Paragard IUD insertion. Pt reports she was scheduled for it but then missed her appt. Pt is currently on Depo. Last Depo was 06/29/2019 so pt is 11 weeks and 4 days post last Depo. Provided RN counseling to pt regarding paragard IUD and pt reports she would like to come in 09/20/2019 for IUD insertion. Pt scheduled for IUD insertion for 09/20/2019 at 10:20am per pt request. Pt aware of appt date and time and to arrive early for check-in. Pt with no other questions or concerns at this time.Ronny Bacon, RN

## 2019-09-18 NOTE — Telephone Encounter (Signed)
Patient wants IUD

## 2019-09-20 ENCOUNTER — Ambulatory Visit (LOCAL_COMMUNITY_HEALTH_CENTER): Payer: Self-pay | Admitting: Family Medicine

## 2019-09-20 ENCOUNTER — Other Ambulatory Visit: Payer: Self-pay

## 2019-09-20 ENCOUNTER — Encounter: Payer: Self-pay | Admitting: Family Medicine

## 2019-09-20 ENCOUNTER — Other Ambulatory Visit: Payer: Self-pay | Admitting: Family Medicine

## 2019-09-20 VITALS — BP 136/89 | Ht 60.0 in | Wt 171.0 lb

## 2019-09-20 DIAGNOSIS — Z113 Encounter for screening for infections with a predominantly sexual mode of transmission: Secondary | ICD-10-CM

## 2019-09-20 DIAGNOSIS — Z3009 Encounter for other general counseling and advice on contraception: Secondary | ICD-10-CM

## 2019-09-20 DIAGNOSIS — Z30430 Encounter for insertion of intrauterine contraceptive device: Secondary | ICD-10-CM

## 2019-09-20 NOTE — Progress Notes (Signed)
  Family Planning Visit- Repeat Yearly Visit  Subjective:  Kathy Benton is a 28 y.o. being seen today for an well woman visit and to discuss family planning options.    She is currently using Depo-Provera injections for pregnancy prevention. Patient reports she does not if she or her partner wants a pregnancy in the next year. Patient  has Gestational diabetes; Essential hypertension; and Obesity, unspecified on their problem list.  Chief Complaint  Patient presents with  . IUD insertion     Patient reports has had mirena in the past, well tolerated. She is looking for longer term contraception now. Knows a lot about paragard, has spoken with nurse would like this today.  Patient denies no allergy to copper. No undiagnosed vaginal bleeding.  Does the patient desire a pregnancy in the next year? (OKQ flowsheet) No  See flowsheet for other program required questions.   Body mass index is 33.4 kg/m. - Patient is eligible for diabetes screening based on BMI and age >46?  no HA1C ordered? not applicable  Patient reports 1 of partners in last year. Desires STI screening?  Yes  Does the patient have a current or past history of drug use? No   No components found for: HCV]   Health Maintenance Due  Topic Date Due  . INFLUENZA VACCINE  06/02/2019    ROS  The following portions of the patient's history were reviewed and updated as appropriate: allergies, current medications, past family history, past medical history, past social history, past surgical history and problem list. Problem list updated.  Objective:   Vitals:   09/20/19 1027  BP: 136/89  Weight: 171 lb (77.6 kg)  Height: 5' (1.524 m)    Physical Exam  deferred   Assessment and Plan:  Kathy Benton is a 28 y.o. female presenting to the [facility] Department for an initial well woman exam/family planning visit  Contraception counseling: Reviewed all forms of birth control options available  including abstinence; over the counter/barrier methods; hormonal contraceptive medication including pill, patch, ring, injection,contraceptive implant; hormonal and nonhormonal IUDs; permanent sterilization options including vasectomy and the various tubal sterilization modalities. Risks and benefits reviewed.  Questions were answered.  Written information was also given to the patient to review.  Patient desires paragard, this was prescribed for patient. She will follow up in  1 for surveillance.  She was told to call with any further questions, or with any concerns about this method of contraception.  Emphasized use of condoms 100% of the time for STI prevention.   1. Encounter for insertion of ParaGard IUD Counseling given and questions answered. Paragard placed by Dr. Ernestina Patches today, see separate note.  2. Screening examination for venereal disease When offered, this pt agreed to g/c, hiv, syphilis testing though we unfortunately forgot to collect them at the visit. Attempted to call pt immediately after visit but unable to contact.     Return in about 4 weeks (around 10/18/2019) for IUD string check.  No future appointments.  Kandee Keen, PA-C

## 2019-09-20 NOTE — Progress Notes (Signed)
Last Depo 11 weeks ago. Here for paragard inertion. Aileen Fass, RN

## 2019-10-05 MED ORDER — PARAGARD INTRAUTERINE COPPER IU IUD
1.0000 | INTRAUTERINE_SYSTEM | Freq: Once | INTRAUTERINE | Status: AC
  Administered 2019-09-20: 1 via INTRAUTERINE

## 2020-01-25 ENCOUNTER — Telehealth: Payer: Self-pay | Admitting: Family Medicine

## 2020-02-04 ENCOUNTER — Ambulatory Visit: Payer: Self-pay

## 2020-02-13 ENCOUNTER — Other Ambulatory Visit: Payer: Self-pay

## 2020-02-13 ENCOUNTER — Encounter: Payer: Self-pay | Admitting: Advanced Practice Midwife

## 2020-02-13 ENCOUNTER — Ambulatory Visit: Payer: Self-pay | Admitting: Advanced Practice Midwife

## 2020-02-13 DIAGNOSIS — Z5321 Procedure and treatment not carried out due to patient leaving prior to being seen by health care provider: Secondary | ICD-10-CM

## 2020-02-13 NOTE — Progress Notes (Signed)
Patient here, with 2 children,  for STD testing. States she plans to make appointment to discuss change in [facility] at a different time. Currently has Paraguard.Burt Knack, RN

## 2020-02-13 NOTE — Progress Notes (Signed)
Patient states she has to leave to get her son to soccer practice and will reschedule for a morning appointment. Patient counseled to schedule an FP appointment for discussion of [facility] and STD testing. Patient states agreement and has left without seeing provider.Burt Knack, RN

## 2020-02-13 NOTE — Telephone Encounter (Signed)
Patient had appt today (02/13/20).  Call never routed to nurse. Richmond Campbell, RN

## 2020-02-21 ENCOUNTER — Other Ambulatory Visit: Payer: Self-pay

## 2020-02-21 ENCOUNTER — Encounter: Payer: Self-pay | Admitting: Physician Assistant

## 2020-02-21 ENCOUNTER — Ambulatory Visit (LOCAL_COMMUNITY_HEALTH_CENTER): Payer: Self-pay | Admitting: Physician Assistant

## 2020-02-21 VITALS — BP 123/83 | Ht 60.0 in | Wt 174.6 lb

## 2020-02-21 DIAGNOSIS — Z309 Encounter for contraceptive management, unspecified: Secondary | ICD-10-CM

## 2020-02-21 DIAGNOSIS — Z30013 Encounter for initial prescription of injectable contraceptive: Secondary | ICD-10-CM

## 2020-02-21 DIAGNOSIS — Z3046 Encounter for surveillance of implantable subdermal contraceptive: Secondary | ICD-10-CM

## 2020-02-21 LAB — WET PREP FOR TRICH, YEAST, CLUE
Trichomonas Exam: NEGATIVE
Yeast Exam: NEGATIVE

## 2020-02-21 MED ORDER — MEDROXYPROGESTERONE ACETATE 150 MG/ML IM SUSP
150.0000 mg | Freq: Once | INTRAMUSCULAR | Status: AC
  Administered 2020-02-21: 150 mg via INTRAMUSCULAR

## 2020-02-21 NOTE — Progress Notes (Signed)
[place] problem visit  [place] Department  Subjective:  Kathy Benton is a 29 y.o. being seen today for heavy menstrual bleeding and headaches that she attributes to her Paraguard IUD. Wants to switch back to DMPA, which she has used in the past. Also desires STI testing.  No chief complaint on file.   HPI   Does the patient have a current or past history of drug use? no   No components found for: HCV]   Health Maintenance Due  Topic Date Due  . COVID-19 Vaccine (1) Never done    Review of Systems  Constitutional: Negative.   HENT: Negative.   Eyes: Negative.   Respiratory: Negative.   Cardiovascular: Negative.   Gastrointestinal: Negative.   Genitourinary:       No vaginal discharge, dysuria, odor, itching or lesions/rash. Irreg heavy menses.  Skin: Negative.   Neurological: Positive for headaches.    The following portions of the patient's history were reviewed and updated as appropriate: allergies, current medications, past family history, past medical history, past social history, past surgical history and problem list. Problem list updated.   See flowsheet for other program required questions.  Objective:   Vitals:   02/21/20 1036  BP: 123/83  Weight: 174 lb 9.6 oz (79.2 kg)  Height: 5' (1.524 m)    Physical Exam Constitutional:      Appearance: Normal appearance.  Pulmonary:     Effort: Pulmonary effort is normal.  Abdominal:     Palpations: Abdomen is soft.     Tenderness: There is no abdominal tenderness. There is no guarding.  Genitourinary:    General: Normal vulva.     Pubic Area: No rash or pubic lice.      Labia:        Right: No rash, tenderness or lesion.        Left: No rash, tenderness or lesion.      Urethra: No prolapse, urethral pain or urethral lesion.     Vagina: No signs of injury. Vaginal discharge present. No tenderness, bleeding or lesions.     Cervix: Discharge present. No cervical motion  tenderness, friability, lesion or erythema.     Uterus: Not tender.      Adnexa:        Right: No tenderness.         Left: No tenderness.       Rectum: No external hemorrhoid.     Comments: 2 strings visible extending 1 cm from cervical os Lymphadenopathy:     Lower Body: No right inguinal adenopathy. No left inguinal adenopathy.  Skin:    General: Skin is warm.  Neurological:     Mental Status: She is alert and oriented to person, place, and time.  Psychiatric:        Behavior: Behavior normal.        Thought Content: Thought content normal.        Judgment: Judgment normal.    IUD Removal  Patient identified, informed consent performed, consent signed.  Patient was in the dorsal lithotomy position, normal external genitalia was noted.  A speculum was placed in the patient's vagina, normal discharge was noted, no lesions. The cervix was visualized, no lesions, no abnormal discharge.  The strings of the IUD were grasped and pulled using ring forceps. The IUD was removed in its entirety. Patient tolerated the procedure well.    Patient will use DMPA for contraception. Routine preventative health maintenance measures emphasized.  Assessment and Plan:  Kathy Benton is a 29 y.o. female presenting to the [facility] Department for a Women's Health problem visit  1. Encounter for contraceptive management, unspecified type IUD removal done without complication. Start DMPA today 150 mg IM and repeat every 3 mo for 1 year. - WET PREP FOR TRICH, YEAST, CLUE - Chlamydia/Gonorrhea [facility] - medroxyPROGESTERone (DEPO-PROVERA) injection 150 mg  Return in about 3 months (around 05/22/2020) for Routine DMPA injection.  No future appointments.  Landry Dyke, PA-C

## 2020-02-21 NOTE — Progress Notes (Signed)
In to discuss heavy menstral bleeding and HA's with Paraguard; reports husband recently treated for prostatitis and desires screening; declines HIV/RPR testing Sharlette Dense, RN Wet prep reviewed-no Tx indicated; per verbal order A. Streilein, PA-Depo adm. R. delt-well tolerated Sharlette Dense, RN

## 2020-02-22 ENCOUNTER — Ambulatory Visit: Payer: Self-pay

## 2020-06-16 ENCOUNTER — Other Ambulatory Visit: Payer: Self-pay

## 2020-06-16 ENCOUNTER — Ambulatory Visit (LOCAL_COMMUNITY_HEALTH_CENTER): Payer: Self-pay

## 2020-06-16 VITALS — BP 119/67

## 2020-06-16 DIAGNOSIS — Z3201 Encounter for pregnancy test, result positive: Secondary | ICD-10-CM

## 2020-06-16 LAB — PREGNANCY, URINE: Preg Test, Ur: POSITIVE — AB

## 2020-06-16 MED ORDER — PRENATAL VITAMINS 28-0.8 MG PO TABS: 28.0000 mg | ORAL_TABLET | Freq: Every day | ORAL | 0 refills | Status: AC

## 2020-06-16 NOTE — Progress Notes (Signed)
Patient had IUD removed April 2021 and received Depo same day.  Unsure of LMP d/t birth control methods.  Will seek PNC here at [facility].Richmond Campbell, RN

## 2020-06-27 ENCOUNTER — Other Ambulatory Visit: Payer: Self-pay

## 2020-06-27 ENCOUNTER — Ambulatory Visit: Payer: Medicaid Other | Admitting: Family Medicine

## 2020-06-27 VITALS — BP 111/66 | HR 76 | Temp 98.7°F | Ht 59.0 in | Wt 169.4 lb

## 2020-06-27 DIAGNOSIS — Z3480 Encounter for supervision of other normal pregnancy, unspecified trimester: Secondary | ICD-10-CM | POA: Diagnosis not present

## 2020-06-27 DIAGNOSIS — O0930 Supervision of pregnancy with insufficient antenatal care, unspecified trimester: Secondary | ICD-10-CM

## 2020-06-27 DIAGNOSIS — O99210 Obesity complicating pregnancy, unspecified trimester: Secondary | ICD-10-CM | POA: Diagnosis not present

## 2020-06-27 DIAGNOSIS — Z8679 Personal history of other diseases of the circulatory system: Secondary | ICD-10-CM | POA: Insufficient documentation

## 2020-06-27 DIAGNOSIS — Z8759 Personal history of other complications of pregnancy, childbirth and the puerperium: Secondary | ICD-10-CM | POA: Diagnosis not present

## 2020-06-27 LAB — WET PREP FOR TRICH, YEAST, CLUE
Trichomonas Exam: NEGATIVE
Yeast Exam: NEGATIVE

## 2020-06-27 LAB — URINALYSIS
Bilirubin, UA: NEGATIVE
Glucose, UA: NEGATIVE
Ketones, UA: NEGATIVE
Leukocytes,UA: NEGATIVE
Nitrite, UA: NEGATIVE
Protein,UA: NEGATIVE
RBC, UA: NEGATIVE
Specific Gravity, UA: 1.03 (ref 1.005–1.030)
Urobilinogen, Ur: 0.2 mg/dL (ref 0.2–1.0)
pH, UA: 5.5 (ref 5.0–7.5)

## 2020-06-27 LAB — HEMOGLOBIN, FINGERSTICK: Hemoglobin: 12.5 g/dL (ref 11.1–15.9)

## 2020-06-27 NOTE — Progress Notes (Signed)
In house labs reviewed, no treatment indicated. Dental list given, and patient planning to get covid vaccine today or to make appointment if her childcare person can't stay. Patient counseled to go to [facility]. Desires Quad screen today, aware that it will be for information only. Quad screen done today.Burt Knack, RN

## 2020-06-27 NOTE — Progress Notes (Signed)
[facility] HEALTH DEPT [facility] [address] [address] [place] [address] [phone]  INITIAL PRENATAL VISIT NOTE  Subjective:  Kathy Benton is a 29 y.o. [id number] at [redacted]w[redacted]d being seen today to start prenatal care at the [facility] Department.  She is currently monitored for the following issues for this high-risk pregnancy and has Obesity in pregnancy; Supervision of other normal pregnancy, antepartum; History of hypertension; and Late prenatal care (21 wks) on their problem list.  Pt presents for initial OB visit. She lives with her husband and 4 children. She does not work outside the home, husband works at [employer] with Designer, industrial/product. Physically she is feeling well, occasional nausea, no vomiting. She denies chronic medical issues. Her last pap was 01/24/2018 = negative. She denies alcohol, tob or substance use. Her prior births were all SVD at term. Prior pregnancies were complicated by gestational hypertension. She denies hx of chronic hypertension though delivery note from 06/2018 mentions "chronic HTN dx in 2008, no medications."  BP Readings from Last 3 Encounters:  06/27/20 111/66  06/16/20 119/67  02/21/20 123/83      Contractions: Not present. Vag. Bleeding: None.  Movement: Present. Denies leaking of fluid.   Indications for ASA therapy (per uptodate) One of the following: Previous pregnancy with preeclampsia, especially early onset and with an adverse outcome No Multifetal gestation No Chronic hypertension No Type 1 or 2 diabetes mellitus No Chronic kidney disease No Autoimmune disease (antiphospholipid syndrome, systemic lupus erythematosus) No  Two or more of the following: Nulliparity No Obesity (body mass index >30 kg/m2) Yes Family history of preeclampsia in mother or sister No Age ?35 years No Sociodemographic characteristics (African American race, low socioeconomic level) Yes Personal risk factors (eg,  previous pregnancy with low birth weight or small for gestational age infant, previous adverse pregnancy outcome [eg, stillbirth], interval >10 years between pregnancies) Yes   The following portions of the patient's history were reviewed and updated as appropriate: allergies, current medications, past family history, past medical history, past social history, past surgical history and problem list. Problem list updated.  Objective:   Vitals:   06/27/20 0927 06/27/20 0928  BP: 111/66   Pulse: 76   Temp: 98.7 F (37.1 C)   Weight: 169 lb 6.4 oz (76.8 kg)   Height:  4\' 11"  (1.499 m)    Fetal Status: Fetal Heart Rate (bpm): 150 Fundal Height: 21 cm Movement: Present  Presentation: Undeterminable   Physical Exam Vitals and nursing note reviewed.  Constitutional:      General: She is not in acute distress.    Appearance: Normal appearance. She is well-developed.  HENT:     Head: Normocephalic and atraumatic.     Right Ear: External ear normal.     Left Ear: External ear normal.     Nose: Nose normal. No congestion or rhinorrhea.     Mouth/Throat:     Lips: Pink.     Mouth: Mucous membranes are moist.     Dentition: Normal dentition. No dental caries.     Pharynx: Oropharynx is clear. Uvula midline.  Eyes:     General: No scleral icterus.    Conjunctiva/sclera: Conjunctivae normal.  Neck:     Thyroid: No thyroid mass or thyromegaly.  Cardiovascular:     Rate and Rhythm: Normal rate.     Pulses: Normal pulses.     Comments: Extremities are warm and well perfused Pulmonary:     Effort: Pulmonary  effort is normal.     Breath sounds: Normal breath sounds.  Chest:     Breasts: Breasts are symmetrical.        Right: Normal. No mass, nipple discharge or skin change.        Left: Normal. No mass, nipple discharge or skin change.  Abdominal:     General: Abdomen is flat.     Palpations: Abdomen is soft.     Tenderness: There is no abdominal tenderness.     Comments: Gravid    Genitourinary:    General: Normal vulva.     Exam position: Lithotomy position.     Pubic Area: No rash.      Labia:        Right: No rash.        Left: No rash.      Vagina: Vaginal discharge (white) present.     Cervix: No cervical motion tenderness or friability.     Uterus: Normal. Enlarged (Gravid 21 wk size ). Not tender.      Adnexa: Right adnexa normal and left adnexa normal.     Rectum: Normal. No external hemorrhoid.  Musculoskeletal:     Right lower leg: No edema.     Left lower leg: No edema.  Lymphadenopathy:     Upper Body:     Right upper body: No axillary adenopathy.     Left upper body: No axillary adenopathy.  Skin:    General: Skin is warm.     Capillary Refill: Capillary refill takes less than 2 seconds.     Findings: Burn (well healed scar on L forearm) present.  Neurological:     Mental Status: She is alert.     Assessment and Plan:  Pregnancy: [id number] at [redacted]w[redacted]d   1. Supervision of other normal pregnancy, antepartum -Initial prenatal visit today.   -Pt elects quad genetic screening, obtained today -Anatomy/dating [place] referral made today for [facility]. Dating is by LMP while using paragard IUD. IUD removed April 22 and Depo started. Measurements today suggest pregnancy during this transition of contraceptives. -PHQ-9 score is 1. Pt informed of counselor available if desired at any time.  -Routine dental care recommended during pregnancy, handout of local dentists provided. -Reviewed risk factors and aspirin is indicated, pt accepts -Pap due 12/2020 -Covid vaccine discussed, pt accepts - will get today - Urine Culture - Prenatal profile without Varicella/Rubella (124580) - Varicella zoster antibody, IgG - HCV Ab w Reflex to Quant PCR - Chlamydia/GC NAA, Confirmation - HIV Antibody (routine testing w rflx) - WET PREP FOR TRICH, YEAST, CLUE - Hemoglobin, venipuncture - Urinalysis (Urine Dip) - QUAD Screen [facility] Only  2. Obesity in pregnancy -Early GTT  today, add'l labs as listed. -Pt accepts 81mg  aspirin daily from 12-[redacted] wks gestation.  -May consider serial growth scans starting at 28 wks. - Glucose, 1 hour gestational - Hgb A1c w/o eAG - Comprehensive metabolic panel - TSH  3. History of hypertension -Per 2019 delivery note "CHTN - diagnosed 2008, no meds." Hx GHTN x2. BP wnl today as well as pre-pregnancy in April 2021. Will get baseline labs and continue to monitor BP.  -Begin aspirin today. - PIH Panel ([facility] May 2021) - Protein / creatinine ratio, urine  (Spot)  4. Late prenatal care   Discussed overview of care and coordination with inpatient delivery practices including [facility], [id number], [facility] and [facility].     Preterm labor symptoms and general obstetric precautions including but not limited to vaginal bleeding,  contractions, leaking of fluid and fetal movement were reviewed in detail with the patient.  Please refer to After Visit Summary for other counseling recommendations.   Return in about 4 weeks (around 07/25/2020) for routine prenatal care.  Future Appointments  Date Time Provider Department Center  07/25/2020 10:00 AM [facility] PROVIDER [facility] None    Ann Held, PA-C

## 2020-06-27 NOTE — Progress Notes (Signed)
In for initial MH visit. Take PNV, denies Ed/hospital visit.

## 2020-06-28 LAB — CBC/D/PLT+RPR+RH+ABO+AB SCR
Antibody Screen: NEGATIVE
Basophils Absolute: 0 10*3/uL (ref 0.0–0.2)
Basos: 0 %
EOS (ABSOLUTE): 0.1 10*3/uL (ref 0.0–0.4)
Eos: 1 %
Hematocrit: 36.4 % (ref 34.0–46.6)
Hemoglobin: 12.3 g/dL (ref 11.1–15.9)
Hepatitis B Surface Ag: NEGATIVE
Immature Grans (Abs): 0.1 10*3/uL (ref 0.0–0.1)
Immature Granulocytes: 1 %
Lymphocytes Absolute: 1.9 10*3/uL (ref 0.7–3.1)
Lymphs: 22 %
MCH: 31.5 pg (ref 26.6–33.0)
MCHC: 33.8 g/dL (ref 31.5–35.7)
MCV: 93 fL (ref 79–97)
Monocytes Absolute: 0.4 10*3/uL (ref 0.1–0.9)
Monocytes: 4 %
Neutrophils Absolute: 6.2 10*3/uL (ref 1.4–7.0)
Neutrophils: 72 %
Platelets: 294 10*3/uL (ref 150–450)
RBC: 3.91 x10E6/uL (ref 3.77–5.28)
RDW: 13.8 % (ref 11.7–15.4)
RPR Ser Ql: NONREACTIVE
Rh Factor: POSITIVE
WBC: 8.6 10*3/uL (ref 3.4–10.8)

## 2020-06-28 LAB — COMPREHENSIVE METABOLIC PANEL
ALT: 7 IU/L (ref 0–32)
AST: 10 IU/L (ref 0–40)
Albumin/Globulin Ratio: 1.5 (ref 1.2–2.2)
Albumin: 4 g/dL (ref 3.8–4.8)
Alkaline Phosphatase: 74 IU/L (ref 48–121)
BUN/Creatinine Ratio: 9 (ref 9–23)
BUN: 4 mg/dL — ABNORMAL LOW (ref 6–20)
Bilirubin Total: 0.3 mg/dL (ref 0.0–1.2)
CO2: 22 mmol/L (ref 20–29)
Calcium: 9 mg/dL (ref 8.7–10.2)
Chloride: 100 mmol/L (ref 96–106)
Creatinine, Ser: 0.43 mg/dL — ABNORMAL LOW (ref 0.57–1.00)
GFR calc Af Amer: 157 mL/min/{1.73_m2} (ref >59)
GFR calc non Af Amer: 136 mL/min/{1.73_m2} (ref >59)
Globulin, Total: 2.7 g/dL (ref 1.5–4.5)
Glucose: 208 mg/dL — ABNORMAL HIGH (ref 65–99)
Potassium: 4.1 mmol/L (ref 3.5–5.2)
Sodium: 136 mmol/L (ref 134–144)
Total Protein: 6.7 g/dL (ref 6.0–8.5)

## 2020-06-28 LAB — PROTEIN / CREATININE RATIO, URINE
Creatinine, Urine: 165.6 mg/dL
Protein, Ur: 14 mg/dL
Protein/Creat Ratio: 85 mg/g creat (ref 0–200)

## 2020-06-28 LAB — AST+BUN+CREAT+LD+URIC A+HGB...
AST: 8 IU/L (ref 0–40)
BUN: 4 mg/dL — ABNORMAL LOW (ref 6–20)
Creatinine, Ser: 0.43 mg/dL — ABNORMAL LOW (ref 0.57–1.00)
GFR calc Af Amer: 157 mL/min/{1.73_m2} (ref >59)
GFR calc non Af Amer: 136 mL/min/{1.73_m2} (ref >59)
Hematocrit: 35.1 % (ref 34.0–46.6)
Hemoglobin: 12 g/dL (ref 11.1–15.9)
LDH: 147 IU/L (ref 119–226)
Platelets: 286 10*3/uL (ref 150–450)
Uric Acid: 4 mg/dL (ref 2.6–6.2)

## 2020-06-28 LAB — GLUCOSE, 1 HOUR GESTATIONAL: Gestational Diabetes Screen: 195 mg/dL — ABNORMAL HIGH (ref 65–139)

## 2020-06-28 LAB — HCV INTERPRETATION

## 2020-06-28 LAB — HIV ANTIBODY (ROUTINE TESTING W REFLEX): HIV Screen 4th Generation wRfx: NONREACTIVE

## 2020-06-28 LAB — HCV AB W REFLEX TO QUANT PCR: HCV Ab: <0.1 s/co ratio (ref 0.0–0.9)

## 2020-06-28 LAB — VARICELLA ZOSTER ANTIBODY, IGG: Varicella zoster IgG: <135 index — ABNORMAL LOW (ref >165)

## 2020-06-28 LAB — TSH: TSH: 1.2 u[IU]/mL (ref 0.450–4.500)

## 2020-06-28 LAB — HGB A1C W/O EAG: Hgb A1c MFr Bld: 4.9 % (ref 4.8–5.6)

## 2020-06-29 LAB — URINE CULTURE

## 2020-06-30 ENCOUNTER — Telehealth: Payer: Self-pay

## 2020-06-30 ENCOUNTER — Telehealth: Payer: Self-pay | Admitting: Family Medicine

## 2020-06-30 ENCOUNTER — Encounter: Payer: Self-pay | Admitting: Family Medicine

## 2020-06-30 DIAGNOSIS — Z2839 Other underimmunization status: Secondary | ICD-10-CM | POA: Insufficient documentation

## 2020-06-30 DIAGNOSIS — O24419 Gestational diabetes mellitus in pregnancy, unspecified control: Secondary | ICD-10-CM | POA: Insufficient documentation

## 2020-06-30 DIAGNOSIS — O09899 Supervision of other high risk pregnancies, unspecified trimester: Secondary | ICD-10-CM | POA: Insufficient documentation

## 2020-06-30 LAB — CHLAMYDIA/GC NAA, CONFIRMATION
Chlamydia trachomatis, NAA: NEGATIVE
Neisseria gonorrhoeae, NAA: NEGATIVE

## 2020-06-30 NOTE — Telephone Encounter (Signed)
PT. IS RETURNING A CALL ABOUT  APT FOR 3 HOUR GLUCOSE TEST

## 2020-06-30 NOTE — Telephone Encounter (Signed)
Phone call to pt. Left message on voicemail that RN with [facility] is calling about lab results. The glucose/sugar test we did came back elevated, and we want to follow-up with an additional test called a 3 hour. Please call us back at [phone] so we can schedule a time that works for her.

## 2020-06-30 NOTE — Telephone Encounter (Signed)
Phone call to pt. Per pt preference, 3 hr GTT scheduled for 07/02/20. Instructions given for 3 hr GTT and pt expressed understanding.

## 2020-06-30 NOTE — Telephone Encounter (Signed)
See phone note dated 06/30/20 where phone call was initiated for 3 hr gtt appt.

## 2020-06-30 NOTE — Telephone Encounter (Signed)
Per pt preference, 3 hr GTT scheduled for 07/02/20. Instructions given for 3 hr GTT and pt expressed understanding.

## 2020-06-30 NOTE — Telephone Encounter (Signed)
-----   Message from William Hamburger, RN sent at 06/30/2020  8:01 AM EDT -----   ----- Message ----- From: Interface, [facility] Lab Results In Sent: 06/27/2020  12:45 PM EDT To: [facility]-Results

## 2020-06-30 NOTE — Telephone Encounter (Signed)
Phone call to pt. Left message on voicemail that RN with [facility] is calling about lab results. The glucose/sugar test we did came back elevated, and we want to follow-up with an additional test called a 3 hour. Please call us back at [phone].

## 2020-07-02 ENCOUNTER — Other Ambulatory Visit: Payer: Self-pay

## 2020-07-04 ENCOUNTER — Telehealth: Payer: Self-pay

## 2020-07-04 ENCOUNTER — Other Ambulatory Visit: Payer: Self-pay

## 2020-07-04 ENCOUNTER — Encounter: Payer: Self-pay | Admitting: Advanced Practice Midwife

## 2020-07-04 DIAGNOSIS — R799 Abnormal finding of blood chemistry, unspecified: Secondary | ICD-10-CM | POA: Insufficient documentation

## 2020-07-04 NOTE — Telephone Encounter (Signed)
Appt for 3 hour GTT cancelled for 07/02/2020 and [facility] for same test 07/04/2020 am. Call to client and left message regarding importance of test and need to reschedule appt ASAP. Number to call provided. Jossie Ng, RN

## 2020-07-08 ENCOUNTER — Other Ambulatory Visit: Payer: Self-pay | Admitting: Family Medicine

## 2020-07-08 DIAGNOSIS — Z3689 Encounter for other specified antenatal screening: Secondary | ICD-10-CM

## 2020-07-08 DIAGNOSIS — Z3492 Encounter for supervision of normal pregnancy, unspecified, second trimester: Secondary | ICD-10-CM

## 2020-07-11 ENCOUNTER — Encounter: Payer: Self-pay | Admitting: Family Medicine

## 2020-07-11 DIAGNOSIS — R899 Unspecified abnormal finding in specimens from other organs, systems and tissues: Secondary | ICD-10-CM | POA: Insufficient documentation

## 2020-07-14 ENCOUNTER — Telehealth: Payer: Self-pay

## 2020-07-14 NOTE — Telephone Encounter (Signed)
TC to patient to inform of 07/15/20 [facility] U/S. Patient states she is aware of that appointment. Patient scheduled for 3 hr gtt on 07/18/20, this is the date patient requested due to child care issues. She states husband usually has Fridays off, so she can leave the children with him. She states no other childcare before then. Marland KitchenBurt Knack, RN

## 2020-07-14 NOTE — Telephone Encounter (Signed)
Per documentation from this am (phone note), call made to client to notify her of [place] appt and 3 hour GTT appt was rescheduled during that call. As 3 hour GTT now scheduled, this phone note encounter completed. Jossie Ng, RN

## 2020-07-15 ENCOUNTER — Ambulatory Visit
Admission: RE | Admit: 2020-07-15 | Discharge: 2020-07-15 | Disposition: A | Discharge status: Home / Self Care | Payer: Self-pay | Source: Ambulatory Visit | Attending: Family Medicine | Admitting: Family Medicine

## 2020-07-15 ENCOUNTER — Other Ambulatory Visit: Payer: Self-pay

## 2020-07-15 DIAGNOSIS — Z3689 Encounter for other specified antenatal screening: Secondary | ICD-10-CM | POA: Insufficient documentation

## 2020-07-15 DIAGNOSIS — Z3492 Encounter for supervision of normal pregnancy, unspecified, second trimester: Secondary | ICD-10-CM | POA: Insufficient documentation

## 2020-07-17 ENCOUNTER — Telehealth: Payer: Self-pay | Admitting: Physician Assistant

## 2020-07-17 NOTE — Telephone Encounter (Signed)
Reviewed 07/15/20 [place] report, WNL, dating c/w LMP.Trying to arrange genetic counseling visit for eval of 2nd trim screen pos for inc risk DS. Left message on pt's home phone to call [facility].

## 2020-07-18 ENCOUNTER — Telehealth: Payer: Self-pay

## 2020-07-18 ENCOUNTER — Other Ambulatory Visit: Payer: Self-pay

## 2020-07-18 ENCOUNTER — Encounter: Payer: Self-pay | Admitting: Family Medicine

## 2020-07-18 NOTE — Telephone Encounter (Signed)
DNKA (third time) for 3 hour GTT. Call to client and left message explaining importance of 3 hour GTT / reason for testing. Message left requesting she reschedule appt and number to call provided. Jossie Ng, RN

## 2020-07-24 ENCOUNTER — Telehealth: Payer: Self-pay

## 2020-07-24 NOTE — Telephone Encounter (Signed)
Patient called to request 3 hour gtt appointment for 07/25/2020. Patient has MH RV appointment at 10:00 so patient counseled to be here at 8:00 and check in at the elevators as usual. Fasting instructions reviewed with patient and patient states understanding. Patient states she has childcare tomorrow morning and would be able to stay for the full test..Kathy Benton Babs Sciara, RN

## 2020-07-25 ENCOUNTER — Other Ambulatory Visit: Payer: Self-pay

## 2020-07-25 ENCOUNTER — Ambulatory Visit: Payer: Medicaid Other | Admitting: Family Medicine

## 2020-07-25 ENCOUNTER — Encounter: Payer: Self-pay | Admitting: Family Medicine

## 2020-07-25 ENCOUNTER — Telehealth: Payer: Self-pay

## 2020-07-25 ENCOUNTER — Other Ambulatory Visit: Payer: Self-pay | Admitting: Family Medicine

## 2020-07-25 VITALS — BP 111/71 | HR 92 | Temp 97.5°F | Wt 171.6 lb

## 2020-07-25 DIAGNOSIS — R799 Abnormal finding of blood chemistry, unspecified: Secondary | ICD-10-CM

## 2020-07-25 DIAGNOSIS — O99210 Obesity complicating pregnancy, unspecified trimester: Secondary | ICD-10-CM

## 2020-07-25 DIAGNOSIS — Z3482 Encounter for supervision of other normal pregnancy, second trimester: Secondary | ICD-10-CM

## 2020-07-25 DIAGNOSIS — Z1379 Encounter for other screening for genetic and chromosomal anomalies: Secondary | ICD-10-CM

## 2020-07-25 DIAGNOSIS — R899 Unspecified abnormal finding in specimens from other organs, systems and tissues: Secondary | ICD-10-CM

## 2020-07-25 DIAGNOSIS — O99212 Obesity complicating pregnancy, second trimester: Secondary | ICD-10-CM | POA: Diagnosis not present

## 2020-07-25 DIAGNOSIS — O99810 Abnormal glucose complicating pregnancy: Secondary | ICD-10-CM

## 2020-07-25 DIAGNOSIS — Z3480 Encounter for supervision of other normal pregnancy, unspecified trimester: Secondary | ICD-10-CM

## 2020-07-25 DIAGNOSIS — Z8679 Personal history of other diseases of the circulatory system: Secondary | ICD-10-CM

## 2020-07-25 DIAGNOSIS — IMO0002 Reserved for concepts with insufficient information to code with codable children: Secondary | ICD-10-CM

## 2020-07-25 NOTE — Progress Notes (Signed)
   PRENATAL VISIT NOTE  Subjective:  Kathy Benton is a 29 y.o. [id number] at [redacted]w[redacted]d being seen today for ongoing prenatal care.  She is currently monitored for the following issues for this low-risk pregnancy and has Obesity in pregnancy; Supervision of other normal pregnancy, antepartum; History of hypertension; Late prenatal care (21 wks); Maternal varicella, non-immune; Abnormal glucose tolerance in pregnancy 06/27/20; Abnormal blood finding 06/27/20; and Quad + for down syndrome (1:253) on their problem list.  Patient reports no complaints.  Contractions: Not present. Vag. Bleeding: None.  Movement: Present. Denies leaking of fluid/ROM.   The following portions of the patient's history were reviewed and updated as appropriate: allergies, current medications, past family history, past medical history, past social history, past surgical history and problem list. Problem list updated.  Objective:   Vitals:   07/25/20 0904  BP: 111/71  Pulse: 92  Temp: (!) 97.5 F (36.4 C)  Weight: 171 lb 9.6 oz (77.8 kg)    Fetal Status: Fetal Heart Rate (bpm): 150 Fundal Height: 26 cm Movement: Present     General:  Alert, oriented and cooperative. Patient is in no acute distress.  Skin: Skin is warm and dry. No rash noted.   Cardiovascular: Normal heart rate noted  Respiratory: Normal respiratory effort, no problems with respiration noted  Abdomen: Soft, gravid, appropriate for gestational age.  Pain/Pressure: Absent     Pelvic: Cervical exam deferred        Extremities: Normal range of motion.  Edema: None  Mental Status: Normal mood and affect. Normal behavior. Normal judgment and thought content.   Assessment and Plan:  Pregnancy: [id number] at [redacted]w[redacted]d   1. Supervision of other normal pregnancy, antepartum -Up to date. Anatomy US reviewed -WNL, girl! -She didn't get 1st Covid vaccine last visit, is considering today.  2. Abnormal glucose tolerance in pregnancy 06/27/20 -1 hr abn, getting 3 hr  today. As she has hx GDM I advised to begin GDM diet now - Glucose Tolerance Test, 6 Hour  3. Obesity in pregnancy 7 lb 9.6 oz (3.447 kg)  4. History of hypertension -BP wnl today  5. Abnormal blood finding 06/27/20 -Repeat CMP today per E. Sciora, CNM - Comprehensive metabolic panel  6. Quad + for down syndrome (1:253): awaiting dating US -Anatomy US has confirmed her dating. Pt desired genetic counseling and detailed anatomy US - referral placed today to [facility].   Preterm labor symptoms and general obstetric precautions including but not limited to vaginal bleeding, contractions, leaking of fluid and fetal movement were reviewed in detail with the patient. Please refer to After Visit Summary for other counseling recommendations.  Return in about 2 weeks (around 08/08/2020) for routine prenatal care.  No future appointments.  Ann Held, PA-C

## 2020-07-25 NOTE — Telephone Encounter (Signed)
TC to patient to inform of [facility] appointment on 08/04/2020, GC at 10:00 and U/S 11:00. [facility] 9:45[facility] and may bring partner if desires. LM with number to call.Burt Knack, RN

## 2020-07-25 NOTE — Telephone Encounter (Signed)
Returning Katie's call about an ultrasound apt.

## 2020-07-25 NOTE — Progress Notes (Signed)
Patient here for MH RV at 25 6/7. 3 hour gtt today. Next lab draw at 9:51am. Varicella non-immune counseli/Sng today and handout given. S/S PTL reviewed and literature given.Burt Knack, RN

## 2020-07-25 NOTE — Telephone Encounter (Signed)
Patient informed of [facility] U/S on 08/04/20, GC at 10:00 and U/S at 11:00. Patient counseled to arrive at 9:45am and is allowed to take 1 adult with her. Patient states understanding and knows where to go.Burt Knack, RN

## 2020-07-26 LAB — COMPREHENSIVE METABOLIC PANEL
ALT: 11 IU/L (ref 0–32)
AST: 11 IU/L (ref 0–40)
Albumin/Globulin Ratio: 1.4 (ref 1.2–2.2)
Albumin: 3.7 g/dL — ABNORMAL LOW (ref 3.8–4.8)
Alkaline Phosphatase: 80 IU/L (ref 44–121)
BUN/Creatinine Ratio: 10 (ref 9–23)
BUN: 5 mg/dL — ABNORMAL LOW (ref 6–20)
Bilirubin Total: 0.4 mg/dL (ref 0.0–1.2)
CO2: 22 mmol/L (ref 20–29)
Calcium: 8.9 mg/dL (ref 8.7–10.2)
Chloride: 100 mmol/L (ref 96–106)
Creatinine, Ser: 0.52 mg/dL — ABNORMAL LOW (ref 0.57–1.00)
GFR calc Af Amer: 147 mL/min/{1.73_m2} (ref >59)
GFR calc non Af Amer: 128 mL/min/{1.73_m2} (ref >59)
Globulin, Total: 2.6 g/dL (ref 1.5–4.5)
Glucose: 196 mg/dL — ABNORMAL HIGH (ref 65–99)
Potassium: 3.9 mmol/L (ref 3.5–5.2)
Sodium: 137 mmol/L (ref 134–144)
Total Protein: 6.3 g/dL (ref 6.0–8.5)

## 2020-07-26 LAB — GLUCOSE TOLERANCE TEST, 6 HOUR
Glucose, 1 Hour GTT: 194 mg/dL (ref 65–199)
Glucose, 2 hour: 153 mg/dL — ABNORMAL HIGH (ref 65–139)
Glucose, 3 hour: 127 mg/dL — ABNORMAL HIGH (ref 65–109)
Glucose, GTT - Fasting: 89 mg/dL (ref 65–99)

## 2020-07-30 ENCOUNTER — Telehealth: Payer: Self-pay

## 2020-07-30 NOTE — Telephone Encounter (Signed)
Call to client to schedule MHC RV appt at 28 weeks which will include repeat 3 hour GTT (see J. Staples PA-C result note attached to initial 3h GTT results). Reviewed 3 hr GTT test prep instructions with client who verbalized understanding. Appt scheduled for 08/11/20. Jossie Ng, RN

## 2020-08-04 ENCOUNTER — Ambulatory Visit (HOSPITAL_BASED_OUTPATIENT_CLINIC_OR_DEPARTMENT_OTHER): Payer: Self-pay

## 2020-08-04 ENCOUNTER — Other Ambulatory Visit: Payer: Self-pay

## 2020-08-04 ENCOUNTER — Other Ambulatory Visit
Admission: RE | Admit: 2020-08-04 | Discharge: 2020-08-04 | Disposition: A | Discharge status: Home / Self Care | Payer: Self-pay | Source: Ambulatory Visit | Attending: Obstetrics | Admitting: Obstetrics

## 2020-08-04 DIAGNOSIS — Z3A27 27 weeks gestation of pregnancy: Secondary | ICD-10-CM

## 2020-08-04 DIAGNOSIS — O280 Abnormal hematological finding on antenatal screening of mother: Secondary | ICD-10-CM

## 2020-08-04 DIAGNOSIS — R899 Unspecified abnormal finding in specimens from other organs, systems and tissues: Secondary | ICD-10-CM

## 2020-08-04 DIAGNOSIS — Z362 Encounter for other antenatal screening follow-up: Secondary | ICD-10-CM | POA: Insufficient documentation

## 2020-08-04 DIAGNOSIS — O289 Unspecified abnormal findings on antenatal screening of mother: Secondary | ICD-10-CM

## 2020-08-04 DIAGNOSIS — IMO0002 Reserved for concepts with insufficient information to code with codable children: Secondary | ICD-10-CM

## 2020-08-04 DIAGNOSIS — Z1379 Encounter for other screening for genetic and chromosomal anomalies: Secondary | ICD-10-CM

## 2020-08-04 NOTE — Progress Notes (Signed)
Referring Provider:   [facility] Department Length of Consultation: 25 minutes   Kathy Benton was referred to [facility] at [facility] for genetic counseling and targeted ultrasound because of an increased risk for Down syndrome by the maternal serum prenatal screen.  This note summarizes the information we discussed.   We provided background information on the maternal serum prenatal screen.  It was explained that this is a maternal blood test performed between the 14th and 21st week of pregnancy which measures several pregnancy proteins.  The levels of these proteins can help determine if a pregnancy is at high risk for certain birth defects or problems.  However, it cannot diagnose or rule out these defects.  An abnormal maternal serum screen does not necessarily mean that the baby has a problem.  Maternal serum screening can identify approximately 80% of neural tube defects, up to 75% of Down syndrome cases and 60% of trisomy 18 cases.  The neural tube consists of the fetal head and spine and if this structure fails to close during development, spina bifida (open spine) or anencephaly (open skull) could result.  Chromosomes are the inherited structures that contain our instructions for development (genes).  Each cell in our body normally has 46 chromosomes.  Rarely, when an egg and sperm unite, an extra or missing chromosome can be passed on to the baby by mistake.  These types of chromosome problems typically cause mental retardation and might also cause birth defects.  We discussed Down syndrome (an extra chromosome #21) and trisomy 21 (an extra chromosome #18).    The maternal serum screen revealed protein levels that increased the chance of Down syndrome (Trisomy 21) in the pregnancy.  Before maternal serum screening, the age-related chance of Down syndrome in the pregnancy was 1 in 586.  Given the maternal serum screen results, the chance is now estimated to be 1 in 253.   This means that the chance the baby does not have Down sydnrome is greater than 99 percent.  The following prenatal testing options for this pregnancy:  Targeted ultrasound uses high frequency sound waves to create an image of the developing fetus.  An ultrasound is often recommended as a routine means of evaluating the pregnancy.  It is also used to screen for fetal anatomy problems (for example, a heart defect) that might be suggestive of a chromosomal or other abnormality.    Amniocentesis involves the removal of a small amount of amniotic fluid from the sac surrounding the fetus with the use of a thin needle inserted through the maternal abdomen and uterus.  Ultrasound guidance is used throughout the procedure.  Fetal cells are directly evaluated and > 98% of neural tube defects can be detected.  The main risks to this procedure include complications leading to miscarriage in less than 1 in 200 cases (0.5%).    We also reviewed the availability of cell free fetal DNA testing from maternal blood to determine whether or not the baby may have Down syndrome, trisomy 6, or trisomy 39.  This test utilizes a maternal blood sample and DNA sequencing technology to isolate circulating cell free fetal DNA from maternal plasma.  The fetal DNA can then be analyzed for DNA sequences that are derived from the three most common chromosomes involved in aneuploidy, chromosomes 13, 18, and 21.  If the overall amount of DNA is greater than the expected level for any of these chromosomes, aneuploidy is suspected.  While we do not consider  it a replacement for invasive testing and karyotype analysis, a negative result from this testing would be reassuring, though not a guarantee of a normal chromosome complement for the baby.  An abnormal result is certainly suggestive of an abnormal chromosome complement, though we would still recommend CVS or amniocentesis to confirm any findings from this testing.  Cystic Fibrosis and  Spinal Muscular Atrophy (SMA) screening were also discussed with the patient. Both conditions are recessive, which means that both parents must be carriers in order to have a child with the disease.  Cystic fibrosis (CF) is one of the most common genetic conditions in persons of Caucasian ancestry.  This condition occurs in approximately 1 in 2,500 Caucasian persons and results in thickened secretions in the lungs, digestive, and reproductive systems.  For a baby to be at risk for having CF, both of the parents must be carriers for this condition.  Approximately 1 in 66 Caucasian persons is a carrier for CF.  Current carrier testing looks for the most common mutations in the gene for CF and can detect approximately 90% of carriers in the Caucasian population.  This means that the carrier screening can greatly reduce, but cannot eliminate, the chance for an individual to have a child with CF.  If an individual is found to be a carrier for CF, then carrier testing would be available for the Kathy Benton. As part of [place]'s newborn screening profile, all babies born in the state of [place] will have a two-tier screening process.  Specimens are first tested to determine the concentration of immunoreactive trypsinogen (IRT).  The top 5% of specimens with the highest IRT values then undergo DNA testing using a panel of over 40 common CF mutations. SMA is a neurodegenerative disorder that leads to atrophy of skeletal muscle and overall weakness.  This condition is also more prevalent in the Caucasian population, with 1 in 40-1 in 60 persons being a carrier and 1 in 6,000-1 in 10,000 children being affected.  There are multiple forms of the disease, with some causing death in infancy to other forms with survival into adulthood.  The genetics of SMA is complex, but carrier screening can detect up to 95% of carriers in the Caucasian population.  Similar to CF, a negative result can greatly reduce, but cannot  eliminate, the chance to have a child with SMA.  Carrier screening for hemoglobinopathies was also reviewed.  The patient has an MCV of 93.   We obtained a detailed family history and pregnancy history.  The family history is unremarkable for birth defects, developmental delays, recurrent pregnancy loss or known chromosome abnormalities.  Kathy Benton reported no complications or exposures in this pregnancy that would be expected to increase the risk for birth defects.  This is the sixth pregnancy for Kathy Benton and Kathy Benton Kathy Benton, Kathy Benton.  They have 4 healthy children ages 9, 44, 62 and 2 years.  Kathy Benton also has a healthy 85 year old daughter.  They had one elective termination.  After consideration of the options, Kathy Benton elected to proceed with an ultrasound today as well as cell free fetal DNA testing.  The patient declined carrier screening for CF, SMA and hemoglobinopathies.    The patient was encouraged to call with questions or concerns. We can be contacted at [phone].   Cherly Anderson, MS, CGC  Labs ordered:  MaterniT21 PLUS with [facility]

## 2020-08-09 LAB — MATERNIT21 PLUS CORE+SCA
Fetal Fraction: 19
Monosomy X (Turner Syndrome): NOT DETECTED
Result (T21): NEGATIVE
Trisomy 13 (Patau syndrome): NEGATIVE
Trisomy 18 (Edwards syndrome): NEGATIVE
Trisomy 21 (Down syndrome): NEGATIVE
XXX (Triple X Syndrome): NOT DETECTED
XXY (Klinefelter Syndrome): NOT DETECTED
XYY (Jacobs Syndrome): NOT DETECTED

## 2020-08-11 ENCOUNTER — Ambulatory Visit: Payer: Self-pay

## 2020-08-11 ENCOUNTER — Telehealth: Payer: Self-pay

## 2020-08-11 ENCOUNTER — Telehealth: Payer: Self-pay | Admitting: Obstetrics and Gynecology

## 2020-08-11 NOTE — Telephone Encounter (Signed)
Phone call to patient to reschedule today's (08/11/20) MH appt due to no MH provider. No answer, LMTC. Tawny Hopping, RN

## 2020-08-11 NOTE — Telephone Encounter (Signed)
The patient was informed of the results of her recent MaterniT21 testing which yielded NEGATIVE results.  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  The sensitivity for trisomy 21, trisomy 18 and trisomy 13 using this testing are reported as 99.1%, 99.9% and 91.7% respectively.  Thus, while the results of this testing are highly accurate, they are not considered diagnostic at this time.  Should more definitive information be desired, the patient may still consider amniocentesis.   As requested to know by the patient, sex chromosome analysis was included for this sample.  Results are consistent with a female fetus. This is predicted with >99% accuracy.  A maternal serum AFP only should be considered if screening for neural tube defects is desired.  We may be reached at [phone] with any questions or concerns.   Kimmerly Lora F. Darrel Gloss, MS, CGC   

## 2020-08-11 NOTE — Telephone Encounter (Signed)
TC to patient. Need to reschedule MH RV, due to no provider in clinic today. LM with number to call.Burt Knack, RN

## 2020-08-12 NOTE — Telephone Encounter (Signed)
This am phone encounter closed as another phone encounter was opened yesterday pm for same purpose. Jossie Ng, RN

## 2020-08-13 NOTE — Telephone Encounter (Signed)
TC to patient to try to reschedule MH RV appointment. Patient needs 28 week labs and 3 hour gtt at next appointment. LM with number to call.Burt Knack, RN

## 2020-08-15 NOTE — Telephone Encounter (Signed)
Call to client to reschedule [facility] appt and discuss need for 3 hour GTT with 28 week labs that are now due. Left message to call with number to call provided. Call to emergency contact and per sister, she will attempt to contact client to have her call [facility]. Requested / received appt line number. Jossie Ng, RN

## 2020-08-21 ENCOUNTER — Telehealth: Payer: Self-pay

## 2020-08-21 NOTE — Telephone Encounter (Signed)
Call to client to attempt late afternoon contact in order to reschedule missed [facility] RV appt (and also needs repeat 3 hr GTT with 28 week labs). Left message to call with number to call provided. Jossie Ng, RN

## 2020-08-21 NOTE — Telephone Encounter (Signed)
Phone encounter opened in error. Beacher Every, RN  

## 2020-08-21 NOTE — Telephone Encounter (Signed)
Phone call to pt. Left message that RN with [facility] is calling to schedule 3 Hr glucose test along with some other labs that are due. Please call us back to schedule appt.

## 2020-08-25 NOTE — Telephone Encounter (Signed)
Call to client to reschedule missed MHC RV appt (needs 28 weeks labs) and left message to call with number to call provided. Call to emergency contact (mother) and requested she contact client to call and reschedule missed [facility] appt. Mother states she will call client. Jossie Ng, RN

## 2020-08-28 NOTE — Telephone Encounter (Signed)
TC to patient who states she has already rescheduled her missed appointment, and will be here for MH RV with 28 week labs and 3 hour gtt on 09/01/20 at 8:00am. Patient counseled about fasting for 12 hours before her 3 hr gtt, patient states understanding and denies questions.Burt Knack, RN

## 2020-09-01 ENCOUNTER — Ambulatory Visit: Payer: Self-pay

## 2020-09-02 ENCOUNTER — Other Ambulatory Visit: Payer: Self-pay

## 2020-09-03 ENCOUNTER — Ambulatory Visit: Payer: Self-pay | Admitting: Advanced Practice Midwife

## 2020-09-03 ENCOUNTER — Other Ambulatory Visit: Payer: Self-pay

## 2020-09-03 VITALS — BP 115/63 | HR 85 | Temp 97.9°F | Wt 173.8 lb

## 2020-09-03 DIAGNOSIS — Z3480 Encounter for supervision of other normal pregnancy, unspecified trimester: Secondary | ICD-10-CM

## 2020-09-03 DIAGNOSIS — O99810 Abnormal glucose complicating pregnancy: Secondary | ICD-10-CM

## 2020-09-03 DIAGNOSIS — O09899 Supervision of other high risk pregnancies, unspecified trimester: Secondary | ICD-10-CM

## 2020-09-03 DIAGNOSIS — O99213 Obesity complicating pregnancy, third trimester: Secondary | ICD-10-CM

## 2020-09-03 DIAGNOSIS — O0993 Supervision of high risk pregnancy, unspecified, third trimester: Secondary | ICD-10-CM | POA: Insufficient documentation

## 2020-09-03 DIAGNOSIS — Z23 Encounter for immunization: Secondary | ICD-10-CM

## 2020-09-03 DIAGNOSIS — O0933 Supervision of pregnancy with insufficient antenatal care, third trimester: Secondary | ICD-10-CM

## 2020-09-03 DIAGNOSIS — O99210 Obesity complicating pregnancy, unspecified trimester: Secondary | ICD-10-CM

## 2020-09-03 DIAGNOSIS — O0930 Supervision of pregnancy with insufficient antenatal care, unspecified trimester: Secondary | ICD-10-CM

## 2020-09-03 DIAGNOSIS — Z8679 Personal history of other diseases of the circulatory system: Secondary | ICD-10-CM

## 2020-09-03 DIAGNOSIS — Z91199 Patient's noncompliance with other medical treatment and regimen due to unspecified reason: Secondary | ICD-10-CM | POA: Insufficient documentation

## 2020-09-03 LAB — HEMOGLOBIN, FINGERSTICK: Hemoglobin: 12.3 g/dL (ref 11.1–15.9)

## 2020-09-03 NOTE — Progress Notes (Signed)
Patient here for MH RV at 31 4/7. 28 week labs today, patient needs 3 hour gtt, , states she is unable to fast due to heartburn relieved only by ginger ale at night. Plans BTL, no Medicaid. Desires flu vaccine today.Burt Knack, RN

## 2020-09-03 NOTE — Progress Notes (Signed)
Flu vaccine and Tdap given today, tolerated well, VIS given for both. NCIR in TEFL teacher. Patient scheduled for 3 hour gtt on 09/05/2020 and counseled about fasting and needs to arrive 0800. Patient states agreement of plan and also scheduled for next RV. Appointment reminder card given. Patient states she plans to try the other remedies for heartburn discussed with provider.Burt Knack, RN

## 2020-09-03 NOTE — Progress Notes (Signed)
   PRENATAL VISIT NOTE  Subjective:  Kathy Benton is a 29 y.o. [id number] at [redacted]w[redacted]d being seen today for ongoing prenatal care.  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  Patient reports no complaints.  Contractions: Not present. Vag. Bleeding: None.  Movement: Present. Denies leaking of fluid/ROM.   The following portions of the patient's history were reviewed and updated as appropriate: allergies, current medications, past family history, past medical history, past social history, past surgical history and problem list. Problem list updated.  Objective:   Vitals:   09/03/20 0904  BP: 115/63  Pulse: 85  Temp: 97.9 F (36.6 C)  Weight: 173 lb 12.8 oz (78.8 kg)    Fetal Status: Fetal Heart Rate (bpm): 120 Fundal Height: 33 cm Movement: Present     General:  Alert, oriented and cooperative. Patient is in no acute distress.  Skin: Skin is warm and dry. No rash noted.   Cardiovascular: Normal heart rate noted  Respiratory: Normal respiratory effort, no problems with respiration noted  Abdomen: Soft, gravid, appropriate for gestational age.  Pain/Pressure: Absent     Pelvic: Cervical exam deferred        Extremities: Normal range of motion.  Edema: None  Mental Status: Normal mood and affect. Normal behavior. Normal judgment and thought content.   Assessment and Plan:  Pregnancy: [id number] at [redacted]w[redacted]d  1. Noncompliant pregnant patient, antepartum No prenatal care x 6 weeks "I've been busy with my 31 yo".  Not working and has help from her mom with kids  2. Abnormal glucose tolerance in pregnancy  06/27/20 Equivocal 3 hr GTT on 07/25/20.  Due to not coming for prenatal care since 07/25/20, [facility] 09/01/20 and 09/02/20 apts for 3 hr GTT, and for today's apt she is not fasting.  Pt agrees to come back 09/05/20 for her 28 wk 3 hr GTT. Discussed potential complications to self and fetus with undiagnosed gestational diabetes and need/importance for this lab test this week, and if she doesn't get her 3 hr GTT this week she will need to transfer care to [facility] for remainder of her pregnancy  3. Late prenatal care (21 wks)   4. History of hypertension--CHTN dx'd 2008 With all of her pregnancies per pt report  5. Obesity in pregnancy 9 lb 12.8 oz (4.445 kg) Taking ASA 81 mg daily   6. Supervision of other normal pregnancy, antepartum Wants BTL but has no insurance or Medicaid.  Discussed pt calling [facility] to find out payment requirements Reminded of f/u growth u/s 09/08/20. Pt c/o GERD--suggestions given for TUMS, Pepcid, milk, etc - HIV Antibody (routine testing w rflx) - RPR - Hemoglobin, fingerstick   Preterm labor symptoms and general obstetric precautions including but not limited to vaginal bleeding, contractions, leaking of fluid and fetal movement were reviewed in detail with the patient. Please refer to After Visit Summary for other counseling recommendations.  No follow-ups on file.  Future Appointments  Date Time Provider Department Center  09/08/2020 10:00 AM [facility]    Alberteen Spindle, CNM

## 2020-09-04 ENCOUNTER — Other Ambulatory Visit: Payer: Self-pay | Admitting: Family Medicine

## 2020-09-04 DIAGNOSIS — O289 Unspecified abnormal findings on antenatal screening of mother: Secondary | ICD-10-CM

## 2020-09-04 LAB — HIV ANTIBODY (ROUTINE TESTING W REFLEX): HIV Screen 4th Generation wRfx: NONREACTIVE

## 2020-09-04 LAB — RPR: RPR Ser Ql: NONREACTIVE

## 2020-09-05 ENCOUNTER — Other Ambulatory Visit: Payer: Self-pay

## 2020-09-05 DIAGNOSIS — O99810 Abnormal glucose complicating pregnancy: Secondary | ICD-10-CM

## 2020-09-05 NOTE — Progress Notes (Signed)
Pt to clinic for 3 hr gtt. Pt states the last time she had anything to eat or drink other than small sips of water was around 8:10 last night. Pt has times to return to lab. Pt counseled regarding 3 hr gtt instructions. See order by Arnetha Courser, CNM dated 09/03/20 for 3 hr gtt.

## 2020-09-06 LAB — GLUCOSE TOLERANCE TEST, 6 HOUR
Glucose, 1 Hour GTT: 203 mg/dL — ABNORMAL HIGH (ref 65–199)
Glucose, 2 hour: 185 mg/dL — ABNORMAL HIGH (ref 65–139)
Glucose, 3 hour: 128 mg/dL — ABNORMAL HIGH (ref 65–109)
Glucose, GTT - Fasting: 78 mg/dL (ref 65–99)

## 2020-09-08 ENCOUNTER — Telehealth: Payer: Self-pay | Admitting: Family Medicine

## 2020-09-08 ENCOUNTER — Other Ambulatory Visit: Payer: Self-pay

## 2020-09-08 ENCOUNTER — Ambulatory Visit: Payer: Self-pay | Attending: Obstetrics

## 2020-09-08 DIAGNOSIS — O281 Abnormal biochemical finding on antenatal screening of mother: Secondary | ICD-10-CM

## 2020-09-08 DIAGNOSIS — O289 Unspecified abnormal findings on antenatal screening of mother: Secondary | ICD-10-CM | POA: Insufficient documentation

## 2020-09-08 DIAGNOSIS — O351XX0 Maternal care for (suspected) chromosomal abnormality in fetus, not applicable or unspecified: Secondary | ICD-10-CM | POA: Insufficient documentation

## 2020-09-08 DIAGNOSIS — Z3A32 32 weeks gestation of pregnancy: Secondary | ICD-10-CM | POA: Insufficient documentation

## 2020-09-08 NOTE — Telephone Encounter (Signed)
Patient has question about referral for TL appointment with [facility].

## 2020-09-09 ENCOUNTER — Other Ambulatory Visit: Payer: Self-pay | Admitting: Family Medicine

## 2020-09-09 ENCOUNTER — Encounter: Payer: Self-pay | Admitting: Family Medicine

## 2020-09-09 DIAGNOSIS — O24419 Gestational diabetes mellitus in pregnancy, unspecified control: Secondary | ICD-10-CM

## 2020-09-09 MED ORDER — ACCU-CHEK SMARTVIEW VI STRP: ORAL_STRIP | 12 refills | Status: DC

## 2020-09-09 MED ORDER — ACCU-CHEK NANO SMARTVIEW W/DEVICE KIT: 1.0000 | PACK | 0 refills | Status: DC

## 2020-09-09 MED ORDER — ACCU-CHEK SOFTCLIX LANCETS MISC: 120.0000 | Freq: Four times a day (QID) | 12 refills | Status: DC

## 2020-09-09 NOTE — Progress Notes (Signed)
Referral to [facility] placed today due to Gestational Diabetes - ordered meter, strips, lancets

## 2020-09-09 NOTE — Telephone Encounter (Signed)
T/C to pt to discuss she is a gestational diabetic based on her 3 hour GTT on 09/05/20.  Discussed need/importance of 2 [facility] appts and they will give her BS log and supplies.  Discussed weekly appts now and will need to bring BS log each time/compliance if she wants to remain at [facility] for prenatal care.  Pt agrees, questions answered.  Apt made to come in 09/12/20 at 8:45 and then weekly.  Pt states she called [facility] to discuss cost of BTL and they told her we needed to send referral to them in order for them to discuss with her

## 2020-09-09 NOTE — Telephone Encounter (Signed)
[facility] referral with snapshot pages faxed and fax confirmation received. Refer to Ms. Sciora CNM note regarding reason for referral. Jossie Ng, RN

## 2020-09-12 ENCOUNTER — Telehealth: Payer: Self-pay

## 2020-09-12 ENCOUNTER — Ambulatory Visit: Payer: Self-pay

## 2020-09-12 NOTE — Telephone Encounter (Signed)
Per receptionist, client has appt to discuss BTL / costs with Dr. Elesa Massed on 09/19/2020 at 8:45 am. Jossie Ng, RN

## 2020-09-15 ENCOUNTER — Ambulatory Visit: Payer: Self-pay | Admitting: Student

## 2020-09-17 ENCOUNTER — Ambulatory Visit: Payer: Self-pay | Admitting: Advanced Practice Midwife

## 2020-09-17 ENCOUNTER — Other Ambulatory Visit: Payer: Self-pay

## 2020-09-17 VITALS — BP 120/70 | HR 96 | Temp 98.0°F | Wt 173.6 lb

## 2020-09-17 DIAGNOSIS — O0993 Supervision of high risk pregnancy, unspecified, third trimester: Secondary | ICD-10-CM

## 2020-09-17 DIAGNOSIS — Z9119 Patient's noncompliance with other medical treatment and regimen: Secondary | ICD-10-CM

## 2020-09-17 DIAGNOSIS — O99210 Obesity complicating pregnancy, unspecified trimester: Secondary | ICD-10-CM

## 2020-09-17 DIAGNOSIS — Z8679 Personal history of other diseases of the circulatory system: Secondary | ICD-10-CM

## 2020-09-17 DIAGNOSIS — R899 Unspecified abnormal finding in specimens from other organs, systems and tissues: Secondary | ICD-10-CM

## 2020-09-17 DIAGNOSIS — O09899 Supervision of other high risk pregnancies, unspecified trimester: Secondary | ICD-10-CM

## 2020-09-17 DIAGNOSIS — O24419 Gestational diabetes mellitus in pregnancy, unspecified control: Secondary | ICD-10-CM

## 2020-09-17 LAB — URINALYSIS
Bilirubin, UA: POSITIVE — AB
Glucose, UA: NEGATIVE
Nitrite, UA: NEGATIVE
Protein,UA: NEGATIVE
RBC, UA: NEGATIVE
Specific Gravity, UA: 1.025 (ref 1.005–1.030)
Urobilinogen, Ur: 0.2 mg/dL (ref 0.2–1.0)
pH, UA: 5.5 (ref 5.0–7.5)

## 2020-09-17 NOTE — Progress Notes (Signed)
Aware of 09/19/2020 [facility] appt to discuss BTL. Counseled to take $60 to appt. Per client, has appt reminder at home for [facility] [place] appt 10/06/2020. Per client, [facility] left her a message to call and schedule appt. Call made to Inetta Fermo at [facility] this am while in clinic and appt scheduled for 0830 with arrival time of 0815 on 09/24/2020. Jossie Ng, RN  Urine dip reviewed by Hazle Coca CNM and no new orders given at this time. Jossie Ng, RN

## 2020-09-17 NOTE — Progress Notes (Signed)
PRENATAL VISIT NOTE  Subjective:  Kathy Benton is a 29 y.o. [id number] at [redacted]w[redacted]d being seen today for ongoing prenatal care.  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  Patient reports no complaints.  Contractions: Not present. Vag. Bleeding: None.  Movement: Present. Denies leaking of fluid/ROM.   The following portions of the patient's history were reviewed and updated as appropriate: allergies, current medications, past family history, past medical history, past social history, past surgical history and problem list. Problem list updated.  Objective:   Vitals:   09/17/20 0854  BP: 120/70  Pulse: 96  Temp: 98 F (36.7 C)  Weight: 173 lb 9.6 oz (78.7 kg)    Fetal Status: Fetal Heart Rate (bpm): 130 Fundal Height: 34 cm Movement: Present  Presentation: Complete Breech  General:  Alert, oriented and cooperative. Patient is in no acute distress.  Skin: Skin is warm and dry. No rash noted.   Cardiovascular: Normal heart rate noted  Respiratory: Normal respiratory effort, no problems with respiration noted  Abdomen: Soft, gravid, appropriate for gestational age.  Pain/Pressure: Absent     Pelvic: Cervical exam deferred        Extremities: Normal range of motion.  Edema: None  Mental Status: Normal mood and affect. Normal behavior. Normal judgment and thought content.   Assessment and Plan:  Pregnancy: [id number] at [redacted]w[redacted]d  1. Supervision of high risk pregnancy in third trimester Reviewed 09/08/20 u/s  with anterior placenta, EFW=73%, AFI wnl, appropriate growth at 33 1/7 Has next u/s with MFM 10/06/20 with MFM to see after u/s done Pt reminded of Delivery plans apt with [facility] to discuss BTL and pt is breech today on 09/19/20.  Pt does not have insurance or Medicaid - Urinalysis (Urine Dip)  2. Gestational diabetes mellitus (GDM) affecting pregnancy, antepartum 09/05/20 Please see last apt notes from 09/08/20.  Pt has been noncompliant and not shown for apts, [facility] 3 appts for 3 hr GTT, late entry to care at 21 wks, and 3 hr GTT finally done on 09/05/20 (at 31 wks) diagnosing GD.  Pt received call from [facility] but never returned their call to schedule first [facility] appt; RN called [facility] apt today and scheduled apt for 09/24/20 at 0815.  Pt does not have a glucometer "they have to give it to me", and has not begun checking blood sugars.  May need NST's weekly due to noncompliance??  Counseled pt if she does not go to her [facility] apt, she will need to be transferred to [facility] for remainder of her prenatal care due to noncompliance Strolling at the park with her 29 yo 3x/wk.   3. 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 History of hypertension--CHTN dx'd 2008 Per pt with all of her pregnancies.  120/70 today  5. Obesity in pregnancy 9 lb 9.6 oz (4.355 kg) Taking ASA 81 mg daily  6. Noncompliant pregnant patient, antepartum    Preterm labor symptoms and general obstetric precautions including but not limited  to vaginal bleeding, contractions, leaking of fluid and fetal movement were reviewed in detail with the patient. Please refer to After Visit Summary for other counseling recommendations.  Return in about 1 week (around 09/24/2020).  Future Appointments  Date Time Provider Department Center  09/23/2020  9:00 AM [facility] PROVIDER [facility] None  09/24/2020  8:30 AM Shotwell, Orlean Bradford, RN [facility] None  10/06/2020 10:00 AM [facility] US1  [facility]    Alberteen Spindle, CNM

## 2020-09-23 ENCOUNTER — Ambulatory Visit: Payer: Self-pay

## 2020-09-24 ENCOUNTER — Ambulatory Visit: Payer: Self-pay | Admitting: *Deleted

## 2020-09-24 NOTE — Progress Notes (Signed)
Per Inetta Fermo at [facility], patient did not show up for her appointment this morning. Inetta Fermo states she has called and LM for patient to reschedule.Burt Knack, RN

## 2020-09-29 ENCOUNTER — Ambulatory Visit: Payer: Self-pay | Admitting: Student

## 2020-09-30 ENCOUNTER — Telehealth: Payer: Self-pay

## 2020-09-30 NOTE — Telephone Encounter (Signed)
TC to patient to reschedule missed MH RV. LM with number to call.Shakeem Stern Brewer-Jensen, RN  

## 2020-10-01 NOTE — Telephone Encounter (Signed)
Call to client as needs [facility] RV appt and number to call for [facility] appt [facility] for [facility] as previously scheduled). Left message to call regarding above and number to call [facility] provided. Jossie Ng, RN

## 2020-10-02 ENCOUNTER — Other Ambulatory Visit: Payer: Self-pay | Admitting: Family Medicine

## 2020-10-02 DIAGNOSIS — O280 Abnormal hematological finding on antenatal screening of mother: Secondary | ICD-10-CM

## 2020-10-02 NOTE — Telephone Encounter (Signed)
Phone call to pt. Left message that RN with [facility] is calling to reschedule MH revisit appt and to talk about [facility] appt. Can call us back at [phone].

## 2020-10-06 ENCOUNTER — Ambulatory Visit: Payer: Self-pay

## 2020-10-08 ENCOUNTER — Telehealth: Payer: Self-pay

## 2020-10-08 NOTE — Telephone Encounter (Signed)
Client needs [facility] RV appt and [facility] appt. Call to client and requested she schedule MHC RV appt as 36 week cultures due. Number to call provided. Jossie Ng, RN

## 2020-10-09 ENCOUNTER — Other Ambulatory Visit: Payer: Self-pay | Admitting: Family Medicine

## 2020-10-09 ENCOUNTER — Telehealth: Payer: Self-pay

## 2020-10-09 ENCOUNTER — Ambulatory Visit: Payer: Self-pay | Attending: Obstetrics and Gynecology

## 2020-10-09 ENCOUNTER — Other Ambulatory Visit: Payer: Self-pay

## 2020-10-09 DIAGNOSIS — O99213 Obesity complicating pregnancy, third trimester: Secondary | ICD-10-CM | POA: Insufficient documentation

## 2020-10-09 DIAGNOSIS — Z3A36 36 weeks gestation of pregnancy: Secondary | ICD-10-CM | POA: Insufficient documentation

## 2020-10-09 DIAGNOSIS — O280 Abnormal hematological finding on antenatal screening of mother: Secondary | ICD-10-CM

## 2020-10-09 DIAGNOSIS — O24410 Gestational diabetes mellitus in pregnancy, diet controlled: Secondary | ICD-10-CM | POA: Insufficient documentation

## 2020-10-09 DIAGNOSIS — O24419 Gestational diabetes mellitus in pregnancy, unspecified control: Secondary | ICD-10-CM

## 2020-10-09 NOTE — Telephone Encounter (Signed)
TC to patient to reschedule missed MH RV and missed [facility] appointments. Patient has not been seen for 3 weeks for maternity care here at [facility]. Patient had a delivery plans appointment at [facility] on 09/19/2020, and has not been seen at [facility] since that time. LM for patient to reschedule for asap, tomorrow if possible as her 36 week labs are due.Burt Knack, RN

## 2020-10-09 NOTE — Telephone Encounter (Signed)
Phone call to pt. Left message on voicemail that RN with [facility] is calling, need to schedule appt, certain testing is normally done at 36 weeks and it is due, also need to talk about [facility] appt. [facility] will continue to call and/or reach out to her until we reach her. If not interested in services, please call us back to discuss. Our number is [phone].

## 2020-10-10 ENCOUNTER — Other Ambulatory Visit: Payer: Self-pay

## 2020-10-10 NOTE — Telephone Encounter (Signed)
Client has [facility] RV appt (with NST) scheduled for 10/13/2020. Jossie Ng, RN

## 2020-10-10 NOTE — Progress Notes (Signed)
CM completed hv with member today.  Member reported that she was unable to attend her scheduled appointments because she has no one to care for her two-year-old daughter. CM informed member of the importance of her prenatal care and attending all of her scheduled appts.  Member agreed to f/u with maternity dept. On Monday at 8:45am.  CM praised and encouraged member to f/u with [place] [facility] dept. on Monday at 8:45am.

## 2020-10-10 NOTE — Telephone Encounter (Signed)
Client has [facility] RV (with NST) appt on 10/13/2020. Jossie Ng, RN

## 2020-10-13 ENCOUNTER — Other Ambulatory Visit: Payer: Self-pay

## 2020-10-13 ENCOUNTER — Other Ambulatory Visit: Payer: Self-pay | Admitting: Family Medicine

## 2020-10-13 ENCOUNTER — Ambulatory Visit: Payer: Self-pay | Admitting: Advanced Practice Midwife

## 2020-10-13 VITALS — BP 113/65 | HR 88 | Temp 97.1°F | Wt 175.8 lb

## 2020-10-13 DIAGNOSIS — O99210 Obesity complicating pregnancy, unspecified trimester: Secondary | ICD-10-CM

## 2020-10-13 DIAGNOSIS — O24410 Gestational diabetes mellitus in pregnancy, diet controlled: Secondary | ICD-10-CM

## 2020-10-13 DIAGNOSIS — O281 Abnormal biochemical finding on antenatal screening of mother: Secondary | ICD-10-CM

## 2020-10-13 DIAGNOSIS — O24419 Gestational diabetes mellitus in pregnancy, unspecified control: Secondary | ICD-10-CM

## 2020-10-13 DIAGNOSIS — Z8679 Personal history of other diseases of the circulatory system: Secondary | ICD-10-CM

## 2020-10-13 DIAGNOSIS — Z9119 Patient's noncompliance with other medical treatment and regimen: Secondary | ICD-10-CM

## 2020-10-13 DIAGNOSIS — O09899 Supervision of other high risk pregnancies, unspecified trimester: Secondary | ICD-10-CM

## 2020-10-13 DIAGNOSIS — O0993 Supervision of high risk pregnancy, unspecified, third trimester: Secondary | ICD-10-CM

## 2020-10-13 DIAGNOSIS — O0930 Supervision of pregnancy with insufficient antenatal care, unspecified trimester: Secondary | ICD-10-CM

## 2020-10-13 DIAGNOSIS — O99213 Obesity complicating pregnancy, third trimester: Secondary | ICD-10-CM

## 2020-10-13 LAB — FETAL NONSTRESS TEST

## 2020-10-13 LAB — URINALYSIS
Bilirubin, UA: POSITIVE — AB
Glucose, UA: NEGATIVE
Ketones, UA: NEGATIVE
Nitrite, UA: NEGATIVE
Protein,UA: NEGATIVE
RBC, UA: NEGATIVE
Specific Gravity, UA: 1.025 (ref 1.005–1.030)
Urobilinogen, Ur: 1 mg/dL (ref 0.2–1.0)
pH, UA: 6 (ref 5.0–7.5)

## 2020-10-13 LAB — HEMOGLOBIN, FINGERSTICK: Hemoglobin: 12.5 g/dL (ref 11.1–15.9)

## 2020-10-13 NOTE — Progress Notes (Signed)
PRENATAL VISIT NOTE  Subjective:  Kathy Benton is a 29 y.o. K2H0623 at [redacted]w[redacted]d being seen today for ongoing prenatal care.  She is currently monitored for the following issues for this high-risk pregnancy and has Obesity in pregnancy  BMI=35.5; History of hypertension--CHTN dx'd 2008; Late prenatal care (21 wks); Maternal varicella, non-immune; Gestational diabetes mellitus (GDM) affecting pregnancy with noncompliance & 3 hr GTT at 31 wks on 09/05/20; Quad + for down syndrome (1:253); Noncompliant pregnant patient (no care from 07/25/20-09/03/20) x 6 wks and 3 hr GTT on 09/05/20 at 31 wks; and Supervision of high risk pregnancy in third trimester on their problem list.  Patient reports no complaints.  Contractions: Not present. Vag. Bleeding: None.  Movement: Present. Denies leaking of fluid/ROM.   The following portions of the patient's history were reviewed and updated as appropriate: allergies, current medications, past family history, past medical history, past social history, past surgical history and problem list. Problem list updated.  Objective:   Vitals:   10/13/20 0925  BP: 113/65  Pulse: 88  Temp: (!) 97.1 F (36.2 C)  Weight: 175 lb 12.8 oz (79.7 kg)    Fetal Status: Fetal Heart Rate (bpm): 150 Fundal Height: 40 cm Movement: Present  Presentation: Vertex  General:  Alert, oriented and cooperative. Patient is in no acute distress.  Skin: Skin is warm and dry. No rash noted.   Cardiovascular: Normal heart rate noted  Respiratory: Normal respiratory effort, no problems with respiration noted  Abdomen: Soft, gravid, appropriate for gestational age.  Pain/Pressure: Absent     Pelvic: Cervical exam deferred        Extremities: Normal range of motion.  Edema: None  Mental Status: Normal mood and affect. Normal behavior. Normal judgment and thought content.   Assessment and Plan:  Pregnancy: [id number] at [redacted]w[redacted]d  1. Supervision of high risk pregnancy in third  trimester Noncompliance with prenatal care--has not been seen here since 09/17/20 (3.5 wks) and has gestational diabetes and has not been returning our calls. Pt states she has been receiving our calls but not calling us back.   Darrol Poke went to pt's house and told her about this appt today. Pt states she is "too busy with this 31 year old little one" to come for prenatal care Does not have car seat yet Knows when to go to L&D Reviewed [place] u/s 10/09/20 at [facility] with EFW=77% at 37 2/7 with AFI=wnl, BPP=8/8--needs weekly AP testing (to do NST here today and then pt has appt at [facility] on 10/16/20 at 9:00) S>D today with last u/s 10/09/20 RTC 1 week 36 wk GC/Chlamydia/GBS done today - Chlamydia/GC NAA, Confirmation - Urinalysis (Urine Dip) - GBS Culture - Hemoglobin, venipuncture  2. Gestational diabetes mellitus (GDM) affecting pregnancy with noncompliance & 3 hr GTT at 31 wks on 09/05/20 Pt DNKA [facility] appt made by our RN for 09/24/20 because pt was not returning [facility] calls to schedule. Did  not obtain glucometer or strips and not checking blood glucose.  Log given to pt and counseled pt on importance of checking blood glucose 4x/day, adhering to diabetic diet, exercise, keeping weekly scheduled appts and antenatal fetal testing appts, possible implications to self and fetus if noncompliant during pregnancy with gestational diabetes S>D today To do NST today and has NST/BPP? Scheduled at [facility] on 10/16/20 C/S 10/27/20 with BTL ($360) Breakfast today: 1 granola bar and water Dinner last night:  A sub sandwich with shredded chicken, [place] [employer] Lunch yesterday:  4  tacos, ginger ale No snacks  Pt states she has enough food at home  - Hgb A1c w/o eAG - Glucose, random  3. Noncompliant pregnant patient, antepartum No prenatal care since 09/17/20, has gestational diabetes and [facility] appt RN here made for her on 09/24/20 because pt did not return call from [facility].  Pt DNKA 3 hr GTT  x 3 and was diagnosed at 31 wks. Late entry to care at 21 wks  4. Obesity in pregnancy  BMI=35.5 11 lb 12.8 oz (5.352 kg) Taking ASA 81 mg daily--counseled to stop taking   5. History of hypertension--CHTN dx'd 2008 wnl today 113/65  6. Late prenatal care (21 wks)    Preterm labor symptoms and general obstetric precautions including but not limited to vaginal bleeding, contractions, leaking of fluid and fetal movement were reviewed in detail with the patient. Please refer to After Visit Summary for other counseling recommendations.  Return in about 1 week (around 10/20/2020).  Future Appointments  Date Time Provider Department Center  10/16/2020  9:00 AM [facility] US[facility]  10/17/2020  8:30 AM [facility] PAT1 [facility] None  10/20/2020  1:00 PM [facility]  10/23/2020  8:45 AM [facility] None    Alberteen Spindle, CNM

## 2020-10-13 NOTE — Progress Notes (Signed)
Client sent to lab for urine dip prior to beginning clinic appt. Jossie Ng, RN  Aware of 10/23/2020 Covid testing, 10/27/20 C-section (pre-op will call her with arrival time on 10/17/20) and 11/12/20 two week post-op check with Dr. Elesa Massed. Client DNKA at [facility] and is not monitoring blood sugars. Urine dip reviewed by provider. Client declined self-collection of 36 week cultures. Jossie Ng, RN

## 2020-10-14 LAB — HGB A1C W/O EAG: Hgb A1c MFr Bld: 5.5 % (ref 4.8–5.6)

## 2020-10-14 LAB — GLUCOSE, RANDOM: Glucose: 82 mg/dL (ref 65–99)

## 2020-10-16 ENCOUNTER — Other Ambulatory Visit: Payer: Self-pay

## 2020-10-16 ENCOUNTER — Other Ambulatory Visit: Payer: Self-pay | Admitting: Family Medicine

## 2020-10-16 DIAGNOSIS — O99213 Obesity complicating pregnancy, third trimester: Secondary | ICD-10-CM

## 2020-10-16 DIAGNOSIS — O24410 Gestational diabetes mellitus in pregnancy, diet controlled: Secondary | ICD-10-CM

## 2020-10-16 LAB — CHLAMYDIA/GC NAA, CONFIRMATION
Chlamydia trachomatis, NAA: NEGATIVE
Neisseria gonorrhoeae, NAA: NEGATIVE

## 2020-10-17 ENCOUNTER — Other Ambulatory Visit: Admission: RE | Admit: 2020-10-17 | Payer: Self-pay | Source: Ambulatory Visit

## 2020-10-17 LAB — CULTURE, BETA STREP (GROUP B ONLY): Strep Gp B Culture: NEGATIVE

## 2020-10-20 ENCOUNTER — Telehealth: Payer: Self-pay

## 2020-10-20 ENCOUNTER — Other Ambulatory Visit: Payer: Self-pay

## 2020-10-20 ENCOUNTER — Ambulatory Visit: Payer: Self-pay

## 2020-10-20 NOTE — Telephone Encounter (Signed)
[facility] as scheduled this am in [facility] for RV. Call to client and per recorded message, voicemail box is full and unable to accept calls. Call to emergency contact (mother) who states will try to contact client to call for appt. M. Yemen, [facility] interpreter assisted with call. Jossie Ng, RN

## 2020-10-21 ENCOUNTER — Other Ambulatory Visit: Payer: Self-pay

## 2020-10-21 ENCOUNTER — Other Ambulatory Visit
Admission: RE | Admit: 2020-10-21 | Discharge: 2020-10-21 | Disposition: A | Discharge status: Home / Self Care | Payer: Self-pay | Source: Ambulatory Visit | Attending: Obstetrics & Gynecology | Admitting: Obstetrics & Gynecology

## 2020-10-21 NOTE — Patient Instructions (Signed)
Your procedure is scheduled on: Monday October 27, 2020. Report to Labor and Delivery. Your arrival time will be at 5:45 am, when arrive at [facility], call [phone] and security will come down and escort you to L& D.   Remember: Instructions that are not followed completely may result in serious medical risk,  up to and including death, or upon the discretion of your surgeon and anesthesiologist your  surgery may need to be rescheduled.     _X__ 1. Do not eat food after midnight the night before your procedure.                 No chewing gum or hard candies. You may drink clear liquids up to 2 hours                 before you are scheduled to arrive for your surgery- DO not drink clear                 liquids within 2 hours of the start of your surgery.                 Clear Liquids include:  water, apple juice without pulp, clear Gatorade, G2 or                  Gatorade Zero (avoid Red/Purple/Blue), Black Coffee or Tea (Do not add                 anything to coffee or tea).  __X__2.   Complete the "Ensure Clear Pre-surgery Clear Carbohydrate Drink" provided to you, 2 hours before arrival. **If you are diabetic you will be provided with an alternative drink, Gatorade Zero or G2.  __X__3.  On the morning of surgery brush your teeth with toothpaste and water, you                may rinse your mouth with mouthwash if you wish.  Do not swallow any toothpaste of mouthwash.     _X__ 4.  No Alcohol for 24 hours before or after surgery.   _X__ 5.  Do Not Smoke or use e-cigarettes For 24 Hours Prior to Your Surgery.                 Do not use any chewable tobacco products for at least 6 hours prior to                 Surgery.  _X__ 6.  Do not use any recreational drugs (marijuana, cocaine, heroin, ecstasy, MDMA or other)                For at least one week prior to your surgery.  Combination of these drugs with anesthesia                May have life threatening  results.   __X__ 7.  Notify your doctor if there is any change in your medical condition      (cold, fever, infections).     Do not wear jewelry, make-up, hairpins, clips or nail polish. Do not wear lotions, powders, or perfumes. You may wear deodorant. Do not shave 48 hours prior to surgery. Men may shave face and neck. Do not bring valuables to the hospital.    [facility] is not responsible for any belongings or valuables.  Contacts, dentures or bridgework may not be worn into surgery. Leave your suitcase in the car. After surgery it may be brought to  your room. For patients admitted to the hospital, discharge time is determined by your treatment team.   Patients discharged the day of surgery will not be allowed to drive home.   Make arrangements for someone to be with you for the first 24 hours of your Same Day Discharge.    Please read over the following fact sheets that you were given:     __X__ Take these medicines the morning of surgery with A SIP OF WATER:    1. None    ____ Fleet Enema (as directed)   __x__ Use CHG Soap (or wipes) as directed  ____ Use Benzoyl Peroxide Gel as instructed  ____ Use inhalers on the day of surgery  ____ Stop metformin 2 days prior to surgery    ____ Take 1/2 of usual insulin dose the night before surgery. No insulin the morning          of surgery.   __x__ Stop Anti-inflammatories such as Ibuprofen, Aleve, Advil, naproxen and or BC powders.    __x__ Stop supplements until after surgery.    __x__ Do not start any herbal supplements before your procedure.    If you have any questions regarding your pre-procedure instructions,  Please call Pre-admit Testing at [phone].

## 2020-10-21 NOTE — Telephone Encounter (Signed)
TC to patient, mailbox still full, unable to LM.Marland KitchenBurt Knack, RN

## 2020-10-23 ENCOUNTER — Other Ambulatory Visit: Admission: RE | Admit: 2020-10-23 | Payer: Self-pay | Source: Ambulatory Visit

## 2020-10-23 ENCOUNTER — Other Ambulatory Visit: Payer: Self-pay

## 2020-10-27 ENCOUNTER — Encounter: Admission: RE | Payer: Self-pay | Source: Home / Self Care

## 2020-10-27 ENCOUNTER — Other Ambulatory Visit: Payer: Self-pay

## 2020-10-27 ENCOUNTER — Inpatient Hospital Stay: Admission: RE | Admit: 2020-10-27 | Payer: Self-pay | Source: Home / Self Care | Admitting: Obstetrics & Gynecology

## 2020-10-27 SURGERY — Surgical Case
Anesthesia: Spinal | Laterality: Bilateral

## 2020-10-28 NOTE — Telephone Encounter (Signed)
Call to client to schedule [facility] appt as per 10/22/2020 [facility] phone note, client cancelled 10/27/2020 scheduled C-section. Left message to call with number to call provided. Jossie Ng, RN

## 2020-11-01 NOTE — L&D Delivery Note (Addendum)
Delivery Note At 5:00 PM a viable and female ++ meconium stained  child was delivered via Vaginal, Spontaneous (Presentation: vtx     ).  APGAR:4/5/7 , ; weight  .   Placenta status:intact ++ meconium stained  Spontaneous, Intact.  Cord: 3 vessels with the following complications:precipitous labor with several episodes of prololonged decels 3-5 min None.  Poor maternal pushing . No delayed cord clamping . Peds in attendance . + PPV use and deep meconium suctioning Cord pH: pending   Anesthesia:  Local 1% for repair  Episiotomy: none  Lacerations:  Left labial  Suture Repair: 3.0 vicryl Est. Blood Loss (mL):  800 cc 0.2 mg methergine placed IM and 800 mcg cytotec placed rectal  Mom to postpartum.  Baby to Nursery.  Kathy Benton 11/05/2020, 5:17 PM

## 2020-11-04 ENCOUNTER — Encounter: Payer: Self-pay | Admitting: Obstetrics & Gynecology

## 2020-11-04 ENCOUNTER — Other Ambulatory Visit: Payer: Self-pay

## 2020-11-04 ENCOUNTER — Observation Stay
Admission: EM | Admit: 2020-11-04 | Discharge: 2020-11-04 | Disposition: A | Discharge status: Home / Self Care | Payer: Medicaid Other | Attending: Obstetrics & Gynecology | Admitting: Obstetrics & Gynecology

## 2020-11-04 ENCOUNTER — Telehealth: Payer: Self-pay

## 2020-11-04 DIAGNOSIS — Z3A40 40 weeks gestation of pregnancy: Secondary | ICD-10-CM | POA: Insufficient documentation

## 2020-11-04 DIAGNOSIS — O24419 Gestational diabetes mellitus in pregnancy, unspecified control: Secondary | ICD-10-CM

## 2020-11-04 NOTE — OB Triage Note (Signed)
Pt Kathy Benton 30 y.o. presents to the ED for an induction . Pt is a [id number] at [redacted]w[redacted]d . Pt denies signs and symptons consistent with rupture of membranes or active vaginal bleeding. Pt denies regular contractions and states positive fetal movement. Pt reports ctx that have been irregular and inconsistant since December 23rd. Pt reports last night she has been having irregular contractions but felt more pelvic pressure than pain. Pt currently reports 4/10 abdominal pain with a contraction. External FM and TOCO applied to non-tender abdomen and assessing. Initial FHR 135 . Vital signs obtained and within normal limits. Provider notified of pt.

## 2020-11-04 NOTE — Telephone Encounter (Signed)
Call received from Ms. Kathy Benton who made home visit to client. Client reporting flu-like symptoms. As client with EGA = 40 3/7, GDM / not checking blood sugars, last seen at [facility] 12.13.2021 and now with flu-like / Covid symptoms, she was instructed to go now to [facility] ED / L& D for evaluation. Hazle Coca CNM consulted regarding above and in agreement with plan. Jossie Ng, RN

## 2020-11-04 NOTE — Progress Notes (Signed)
Pt discharged home per C.Ward, MD order.  Pt stable and ambulatory. An After Visit Summary was printed and given to the patient. Discharge education completed with patient/family including follow up instructions, medication list, d/c activities limitations if indicated, with other d/c instructions as indicated by Ward,MD . Pt received labor and bleeding precautions. Pt scheduled for IOL for postdates on 11/08/20 @0500  and instructed to go for covid testing on Thursday 11/06/20 at the [facility] medical arts. Patient able to verbalize understanding, all questions fully answered. Patient instructed to return to ED, call 911, or call MD for any changes in condition. Pt discharged home via personal vehicle with support person.

## 2020-11-04 NOTE — Discharge Instructions (Signed)
Diabetes Mellitus and Exercise Exercising regularly is important for your overall health, especially when you have diabetes (diabetes mellitus). Exercising is not only about losing weight. It has many other health benefits, such as increasing muscle strength and bone density and reducing body fat and stress. This leads to improved fitness, flexibility, and endurance, all of which result in better overall health. Exercise has additional benefits for people with diabetes, including:  Reducing appetite.  Helping to lower and control blood glucose.  Lowering blood pressure.  Helping to control amounts of fatty substances (lipids) in the blood, such as cholesterol and triglycerides.  Helping the body to respond better to insulin (improving insulin sensitivity).  Reducing how much insulin the body needs.  Decreasing the risk for heart disease by: ? Lowering cholesterol and triglyceride levels. ? Increasing the levels of good cholesterol. ? Lowering blood glucose levels. What is my activity plan? Your health care provider or certified diabetes educator can help you make a plan for the type and frequency of exercise (activity plan) that works for you. Make sure that you:  Do at least 150 minutes of moderate-intensity or vigorous-intensity exercise each week. This could be brisk walking, biking, or water aerobics. ? Do stretching and strength exercises, such as yoga or weightlifting, at least 2 times a week. ? Spread out your activity over at least 3 days of the week.  Get some form of physical activity every day. ? Do not go more than 2 days in a row without some kind of physical activity. ? Avoid being inactive for more than 30 minutes at a time. Take frequent breaks to walk or stretch.  Choose a type of exercise or activity that you enjoy, and set realistic goals.  Start slowly, and gradually increase the intensity of your exercise over time. What do I need to know about managing my  diabetes?   Check your blood glucose before and after exercising. ? If your blood glucose is 240 mg/dL (13.3 mmol/L) or higher before you exercise, check your urine for ketones. If you have ketones in your urine, do not exercise until your blood glucose returns to normal. ? If your blood glucose is 100 mg/dL (5.6 mmol/L) or lower, eat a snack containing 15-20 grams of carbohydrate. Check your blood glucose 15 minutes after the snack to make sure that your level is above 100 mg/dL (5.6 mmol/L) before you start your exercise.  Know the symptoms of low blood glucose (hypoglycemia) and how to treat it. Your risk for hypoglycemia increases during and after exercise. Common symptoms of hypoglycemia can include: ? Hunger. ? Anxiety. ? Sweating and feeling clammy. ? Confusion. ? Dizziness or feeling light-headed. ? Increased heart rate or palpitations. ? Blurry vision. ? Tingling or numbness around the mouth, lips, or tongue. ? Tremors or shakes. ? Irritability.  Keep a rapid-acting carbohydrate snack available before, during, and after exercise to help prevent or treat hypoglycemia.  Avoid injecting insulin into areas of the body that are going to be exercised. For example, avoid injecting insulin into: ? The arms, when playing tennis. ? The legs, when jogging.  Keep records of your exercise habits. Doing this can help you and your health care provider adjust your diabetes management plan as needed. Write down: ? Food that you eat before and after you exercise. ? Blood glucose levels before and after you exercise. ? The type and amount of exercise you have done. ? When your insulin is expected to peak, if you use   insulin. Avoid exercising at times when your insulin is peaking.  When you start a new exercise or activity, work with your health care provider to make sure the activity is safe for you, and to adjust your insulin, medicines, or food intake as needed.  Drink plenty of water while  you exercise to prevent dehydration or heat stroke. Drink enough fluid to keep your urine clear or pale yellow. Summary  Exercising regularly is important for your overall health, especially when you have diabetes (diabetes mellitus).  Exercising has many health benefits, such as increasing muscle strength and bone density and reducing body fat and stress.  Your health care provider or certified diabetes educator can help you make a plan for the type and frequency of exercise (activity plan) that works for you.  When you start a new exercise or activity, work with your health care provider to make sure the activity is safe for you, and to adjust your insulin, medicines, or food intake as needed. This information is not intended to replace advice given to you by your health care provider. Make sure you discuss any questions you have with your health care provider. Document Revised: 05/12/2017 Document Reviewed: 03/29/2016 Elsevier Patient Education  2020 Elsevier Inc.  Diabetes Mellitus and Nutrition, Adult When you have diabetes (diabetes mellitus), it is very important to have healthy eating habits because your blood sugar (glucose) levels are greatly affected by what you eat and drink. Eating healthy foods in the appropriate amounts, at about the same times every day, can help you:  Control your blood glucose.  Lower your risk of heart disease.  Improve your blood pressure.  Reach or maintain a healthy weight. Every person with diabetes is different, and each person has different needs for a meal plan. Your health care provider may recommend that you work with a diet and nutrition specialist (dietitian) to make a meal plan that is best for you. Your meal plan may vary depending on factors such as:  The calories you need.  The medicines you take.  Your weight.  Your blood glucose, blood pressure, and cholesterol levels.  Your activity level.  Other health conditions you have,  such as heart or kidney disease. How do carbohydrates affect me? Carbohydrates, also called carbs, affect your blood glucose level more than any other type of food. Eating carbs naturally raises the amount of glucose in your blood. Carb counting is a method for keeping track of how many carbs you eat. Counting carbs is important to keep your blood glucose at a healthy level, especially if you use insulin or take certain oral diabetes medicines. It is important to know how many carbs you can safely have in each meal. This is different for every person. Your dietitian can help you calculate how many carbs you should have at each meal and for each snack. Foods that contain carbs include:  Bread, cereal, rice, pasta, and crackers.  Potatoes and corn.  Peas, beans, and lentils.  Milk and yogurt.  Fruit and juice.  Desserts, such as cakes, cookies, ice cream, and candy. How does alcohol affect me? Alcohol can cause a sudden decrease in blood glucose (hypoglycemia), especially if you use insulin or take certain oral diabetes medicines. Hypoglycemia can be a life-threatening condition. Symptoms of hypoglycemia (sleepiness, dizziness, and confusion) are similar to symptoms of having too much alcohol. If your health care provider says that alcohol is safe for you, follow these guidelines:  Limit alcohol intake to no more   than 1 drink per day for nonpregnant women and 2 drinks per day for men. One drink equals 12 oz of beer, 5 oz of wine, or 1 oz of hard liquor.  Do not drink on an empty stomach.  Keep yourself hydrated with water, diet soda, or unsweetened iced tea.  Keep in mind that regular soda, juice, and other mixers may contain a lot of sugar and must be counted as carbs. What are tips for following this plan?  Reading food labels  Start by checking the serving size on the "Nutrition Facts" label of packaged foods and drinks. The amount of calories, carbs, fats, and other nutrients  listed on the label is based on one serving of the item. Many items contain more than one serving per package.  Check the total grams (g) of carbs in one serving. You can calculate the number of servings of carbs in one serving by dividing the total carbs by 15. For example, if a food has 30 g of total carbs, it would be equal to 2 servings of carbs.  Check the number of grams (g) of saturated and trans fats in one serving. Choose foods that have low or no amount of these fats.  Check the number of milligrams (mg) of salt (sodium) in one serving. Most people should limit total sodium intake to less than 2,300 mg per day.  Always check the nutrition information of foods labeled as "low-fat" or "nonfat". These foods may be higher in added sugar or refined carbs and should be avoided.  Talk to your dietitian to identify your daily goals for nutrients listed on the label. Shopping  Avoid buying canned, premade, or processed foods. These foods tend to be high in fat, sodium, and added sugar.  Shop around the outside edge of the grocery store. This includes fresh fruits and vegetables, bulk grains, fresh meats, and fresh dairy. Cooking  Use low-heat cooking methods, such as baking, instead of high-heat cooking methods like deep frying.  Cook using healthy oils, such as olive, canola, or sunflower oil.  Avoid cooking with butter, cream, or high-fat meats. Meal planning  Eat meals and snacks regularly, preferably at the same times every day. Avoid going long periods of time without eating.  Eat foods high in fiber, such as fresh fruits, vegetables, beans, and whole grains. Talk to your dietitian about how many servings of carbs you can eat at each meal.  Eat 4-6 ounces (oz) of lean protein each day, such as lean meat, chicken, fish, eggs, or tofu. One oz of lean protein is equal to: ? 1 oz of meat, chicken, or fish. ? 1 egg. ?  cup of tofu.  Eat some foods each day that contain healthy  fats, such as avocado, nuts, seeds, and fish. Lifestyle  Check your blood glucose regularly.  Exercise regularly as told by your health care provider. This may include: ? 150 minutes of moderate-intensity or vigorous-intensity exercise each week. This could be brisk walking, biking, or water aerobics. ? Stretching and doing strength exercises, such as yoga or weightlifting, at least 2 times a week.  Take medicines as told by your health care provider.  Do not use any products that contain nicotine or tobacco, such as cigarettes and e-cigarettes. If you need help quitting, ask your health care provider.  Work with a counselor or diabetes educator to identify strategies to manage stress and any emotional and social challenges. Questions to ask a health care provider  Do I   need to meet with a diabetes educator?  Do I need to meet with a dietitian?  What number can I call if I have questions?  When are the best times to check my blood glucose? Where to find more information:  American Diabetes Association: diabetes.org  Academy of Nutrition and Dietetics: www.eatright.org  National Institute of Diabetes and Digestive and Kidney Diseases (NIH): www.niddk.nih.gov Summary  A healthy meal plan will help you control your blood glucose and maintain a healthy lifestyle.  Working with a diet and nutrition specialist (dietitian) can help you make a meal plan that is best for you.  Keep in mind that carbohydrates (carbs) and alcohol have immediate effects on your blood glucose levels. It is important to count carbs and to use alcohol carefully. This information is not intended to replace advice given to you by your health care provider. Make sure you discuss any questions you have with your health care provider. Document Revised: 09/30/2017 Document Reviewed: 11/22/2016 Elsevier Patient Education  2020 Elsevier Inc.  

## 2020-11-04 NOTE — Telephone Encounter (Signed)
Call to client to schedule [facility] RV appt as now 40 3/7 with GDM (Dr. Alvester Morin aware last week client cancelled 10/27/2020 C-section). Staff message sent to R. Marlan Palau MSW, [facility] Care Manager for assistance contacting client. Jossie Ng, RN

## 2020-11-05 ENCOUNTER — Inpatient Hospital Stay
Admission: EM | Admit: 2020-11-05 | Discharge: 2020-11-06 | DRG: 805 | Disposition: A | Discharge status: Home / Self Care | Payer: Medicaid Other | Attending: Obstetrics and Gynecology | Admitting: Obstetrics and Gynecology

## 2020-11-05 ENCOUNTER — Other Ambulatory Visit: Payer: Self-pay

## 2020-11-05 ENCOUNTER — Emergency Department: Admission: EM | Admit: 2020-11-05 | Payer: Self-pay | Source: Home / Self Care

## 2020-11-05 ENCOUNTER — Encounter: Payer: Self-pay | Admitting: Obstetrics and Gynecology

## 2020-11-05 DIAGNOSIS — O99214 Obesity complicating childbirth: Secondary | ICD-10-CM | POA: Diagnosis present

## 2020-11-05 DIAGNOSIS — Z3A40 40 weeks gestation of pregnancy: Secondary | ICD-10-CM

## 2020-11-05 DIAGNOSIS — O24420 Gestational diabetes mellitus in childbirth, diet controlled: Principal | ICD-10-CM | POA: Diagnosis present

## 2020-11-05 DIAGNOSIS — O26893 Other specified pregnancy related conditions, third trimester: Secondary | ICD-10-CM | POA: Diagnosis present

## 2020-11-05 DIAGNOSIS — O9852 Other viral diseases complicating childbirth: Secondary | ICD-10-CM | POA: Diagnosis present

## 2020-11-05 DIAGNOSIS — Z23 Encounter for immunization: Secondary | ICD-10-CM

## 2020-11-05 DIAGNOSIS — U071 COVID-19: Secondary | ICD-10-CM | POA: Diagnosis present

## 2020-11-05 DIAGNOSIS — O24419 Gestational diabetes mellitus in pregnancy, unspecified control: Secondary | ICD-10-CM

## 2020-11-05 DIAGNOSIS — O351XX0 Maternal care for (suspected) chromosomal abnormality in fetus, not applicable or unspecified: Secondary | ICD-10-CM | POA: Diagnosis present

## 2020-11-05 LAB — CBC WITH DIFFERENTIAL/PLATELET
Abs Immature Granulocytes: 0.04 10*3/uL (ref 0.00–0.07)
Basophils Absolute: 0 10*3/uL (ref 0.0–0.1)
Basophils Relative: 0 %
Eosinophils Absolute: 0.1 10*3/uL (ref 0.0–0.5)
Eosinophils Relative: 1 %
HCT: 37.3 % (ref 36.0–46.0)
Hemoglobin: 12.8 g/dL (ref 12.0–15.0)
Immature Granulocytes: 1 %
Lymphocytes Relative: 14 %
Lymphs Abs: 1.3 10*3/uL (ref 0.7–4.0)
MCH: 31.4 pg (ref 26.0–34.0)
MCHC: 34.3 g/dL (ref 30.0–36.0)
MCV: 91.4 fL (ref 80.0–100.0)
Monocytes Absolute: 0.8 10*3/uL (ref 0.1–1.0)
Monocytes Relative: 10 %
Neutro Abs: 6.6 10*3/uL (ref 1.7–7.7)
Neutrophils Relative %: 74 %
Platelets: 233 10*3/uL (ref 150–400)
RBC: 4.08 MIL/uL (ref 3.87–5.11)
RDW: 13.7 % (ref 11.5–15.5)
WBC: 8.8 10*3/uL (ref 4.0–10.5)
nRBC: 0 % (ref 0.0–0.2)

## 2020-11-05 LAB — RESP PANEL BY RT-PCR (FLU A&B, COVID) ARPGX2
Influenza A by PCR: NEGATIVE
Influenza B by PCR: NEGATIVE
SARS Coronavirus 2 by RT PCR: POSITIVE — AB

## 2020-11-05 LAB — TYPE AND SCREEN
ABO/RH(D): O POS
Antibody Screen: NEGATIVE

## 2020-11-05 MED ORDER — MAGNESIUM HYDROXIDE 400 MG/5ML PO SUSP
30.0000 mL | ORAL | Status: DC | PRN
  Filled 2020-11-05: qty 30

## 2020-11-05 MED ORDER — COCONUT OIL OIL: 1.0000 "application " | TOPICAL_OIL | Status: DC | PRN

## 2020-11-05 MED ORDER — FERROUS SULFATE 325 (65 FE) MG PO TABS
325.0000 mg | ORAL_TABLET | Freq: Two times a day (BID) | ORAL | Status: DC
  Administered 2020-11-06: 325 mg via ORAL
  Filled 2020-11-05: qty 1

## 2020-11-05 MED ORDER — LIDOCAINE HCL (PF) 1 % IJ SOLN
INTRAMUSCULAR | Status: AC
  Filled 2020-11-05: qty 30

## 2020-11-05 MED ORDER — LACTATED RINGERS IV SOLN
500.0000 mL | INTRAVENOUS | Status: DC | PRN
  Administered 2020-11-05 (×2): 500 mL via INTRAVENOUS

## 2020-11-05 MED ORDER — DIPHENHYDRAMINE HCL 25 MG PO CAPS: 25.0000 mg | ORAL_CAPSULE | Freq: Four times a day (QID) | ORAL | Status: DC | PRN

## 2020-11-05 MED ORDER — IBUPROFEN 600 MG PO TABS
600.0000 mg | ORAL_TABLET | Freq: Four times a day (QID) | ORAL | Status: DC
  Administered 2020-11-06: 600 mg via ORAL
  Filled 2020-11-05: qty 1

## 2020-11-05 MED ORDER — LIDOCAINE HCL (PF) 1 % IJ SOLN
30.0000 mL | INTRAMUSCULAR | Status: DC | PRN
Start: 2020-11-05 — End: 2020-11-06

## 2020-11-05 MED ORDER — METHYLERGONOVINE MALEATE 0.2 MG/ML IJ SOLN
INTRAMUSCULAR | Status: AC
  Administered 2020-11-05: 0.2 mg
  Filled 2020-11-05: qty 1

## 2020-11-05 MED ORDER — ONDANSETRON HCL 4 MG/2ML IJ SOLN: 4.0000 mg | INTRAMUSCULAR | Status: DC | PRN

## 2020-11-05 MED ORDER — OXYTOCIN-SODIUM CHLORIDE 30-0.9 UT/500ML-% IV SOLN
INTRAVENOUS | Status: AC
  Filled 2020-11-05: qty 500

## 2020-11-05 MED ORDER — SOD CITRATE-CITRIC ACID 500-334 MG/5ML PO SOLN: 30.0000 mL | ORAL | Status: DC | PRN

## 2020-11-05 MED ORDER — ACETAMINOPHEN 325 MG PO TABS: 650.0000 mg | ORAL_TABLET | ORAL | Status: DC | PRN

## 2020-11-05 MED ORDER — OXYTOCIN-SODIUM CHLORIDE 30-0.9 UT/500ML-% IV SOLN: 2.5000 [IU]/h | INTRAVENOUS | Status: DC

## 2020-11-05 MED ORDER — ONDANSETRON HCL 4 MG PO TABS
4.0000 mg | ORAL_TABLET | ORAL | Status: DC | PRN
Start: 2020-11-05 — End: 2020-11-06
  Filled 2020-11-05: qty 1

## 2020-11-05 MED ORDER — PRENATAL MULTIVITAMIN CH
1.0000 | ORAL_TABLET | Freq: Every day | ORAL | Status: DC
  Administered 2020-11-06: 1 via ORAL
  Filled 2020-11-05: qty 1

## 2020-11-05 MED ORDER — WITCH HAZEL-GLYCERIN EX PADS: 1.0000 "application " | MEDICATED_PAD | CUTANEOUS | Status: DC | PRN

## 2020-11-05 MED ORDER — ONDANSETRON HCL 4 MG/2ML IJ SOLN: 4.0000 mg | Freq: Four times a day (QID) | INTRAMUSCULAR | Status: DC | PRN

## 2020-11-05 MED ORDER — TERBUTALINE SULFATE 1 MG/ML IJ SOLN
INTRAMUSCULAR | Status: AC
  Administered 2020-11-05: 1 mg
  Filled 2020-11-05: qty 1

## 2020-11-05 MED ORDER — LACTATED RINGERS IV SOLN: INTRAVENOUS | Status: DC

## 2020-11-05 MED ORDER — OXYCODONE-ACETAMINOPHEN 5-325 MG PO TABS: 1.0000 | ORAL_TABLET | ORAL | Status: DC | PRN

## 2020-11-05 MED ORDER — DIBUCAINE (PERIANAL) 1 % EX OINT: 1.0000 "application " | TOPICAL_OINTMENT | CUTANEOUS | Status: DC | PRN

## 2020-11-05 MED ORDER — MISOPROSTOL 200 MCG PO TABS
ORAL_TABLET | ORAL | Status: AC
  Administered 2020-11-05: 800 ug
  Filled 2020-11-05: qty 4

## 2020-11-05 MED ORDER — SIMETHICONE 80 MG PO CHEW: 80.0000 mg | CHEWABLE_TABLET | ORAL | Status: DC | PRN

## 2020-11-05 MED ORDER — ZOLPIDEM TARTRATE 5 MG PO TABS: 5.0000 mg | ORAL_TABLET | Freq: Every evening | ORAL | Status: DC | PRN

## 2020-11-05 MED ORDER — SENNOSIDES-DOCUSATE SODIUM 8.6-50 MG PO TABS
2.0000 | ORAL_TABLET | ORAL | Status: DC
  Administered 2020-11-06: 2 via ORAL
  Filled 2020-11-05: qty 2

## 2020-11-05 MED ORDER — OXYTOCIN 10 UNIT/ML IJ SOLN
INTRAMUSCULAR | Status: AC
  Filled 2020-11-05: qty 2

## 2020-11-05 MED ORDER — OXYCODONE-ACETAMINOPHEN 5-325 MG PO TABS: 2.0000 | ORAL_TABLET | ORAL | Status: DC | PRN

## 2020-11-05 MED ORDER — ACETAMINOPHEN 325 MG PO TABS
650.0000 mg | ORAL_TABLET | ORAL | Status: DC | PRN
Start: 2020-11-05 — End: 2020-11-06

## 2020-11-05 MED ORDER — BENZOCAINE-MENTHOL 20-0.5 % EX AERO: 1.0000 "application " | INHALATION_SPRAY | CUTANEOUS | Status: DC | PRN

## 2020-11-05 MED ORDER — AMMONIA AROMATIC IN INHA
RESPIRATORY_TRACT | Status: AC
  Filled 2020-11-05: qty 10

## 2020-11-05 MED ORDER — OXYTOCIN BOLUS FROM INFUSION
333.0000 mL | Freq: Once | INTRAVENOUS | Status: AC
  Administered 2020-11-05: 333 mL via INTRAVENOUS

## 2020-11-05 MED ORDER — MEASLES, MUMPS & RUBELLA VAC IJ SOLR
0.5000 mL | Freq: Once | INTRAMUSCULAR | Status: DC
  Filled 2020-11-05: qty 0.5

## 2020-11-05 NOTE — Discharge Summary (Addendum)
Obstetrical Discharge Summary  Patient Name: Kathy Benton DOB: 1991-04-04 MRN: [id number]  Date of Admission: 11/05/2020 Date of Delivery: 11/05/20 Delivered by: Huel Cote MD Date of Discharge: 11/06/2020  Primary OB:  [facility]E last menstrual period was 01/26/2020 (approximate). EDC Estimated Date of Delivery: 11/01/20 Gestational Age at Delivery: [redacted]w[redacted]d   Antepartum complications: suboptimal care   + covid on admission  Admitting Diagnosis: labor Secondary Diagnosis: Patient Active Problem List   Diagnosis Date Noted  . Precipitous delivery 11/05/2020  . COVID-19 11/05/2020  . Indication for care in labor and delivery, antepartum 11/04/2020  . Noncompliant pregnant patient (no care from 07/25/20-09/03/20) x 6 wks and 3 hr GTT on 09/05/20 at 31 wks 09/03/2020  . Supervision of high risk pregnancy in third trimester 09/03/2020  . 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 Maternal varicella, non-immune 06/30/2020  . Gestational diabetes mellitus (GDM) affecting pregnancy with noncompliance & 3 hr GTT at 31 wks on 09/05/20 06/30/2020  . History of hypertension--CHTN dx'd 2008 06/27/2020  . Late prenatal care (21 wks) 06/27/2020  . Obesity in pregnancy  BMI=35.5 01/24/2018    Augmentation: N/A Complications:meconium staining , + covid  On admission  Intrapartum complications/course: precipitous labor  Date of Delivery: 11/05/20 Delivered By: Huel Cote MD Delivery Type: spontaneous vaginal delivery Anesthesia: epidural Placenta: spontaneous Laceration:  Episiotomy: none Newborn Data: Live born female  Birth Weight:   APGAR: , 4/5/7  Cord gas : 7.2 / 52 / 18  Newborn Delivery   Birth date/time: 11/05/2020 17:00:00 Delivery type: Vaginal, Spontaneous     Postpartum Procedures: None  Edinburgh:  Edinburgh Postnatal Depression Scale Screening Tool 11/06/2020  I have been able to laugh and see the funny side of things. 0  I have looked forward with enjoyment  to things. 0  I have blamed myself unnecessarily when things went wrong. 0  I have been anxious or worried for no good reason. 0  I have felt scared or panicky for no good reason. 0  Things have been getting on top of me. 1  I have been so unhappy that I have had difficulty sleeping. 0  I have felt sad or miserable. 0  I have been so unhappy that I have been crying. 0  The thought of harming myself has occurred to me. 0  Edinburgh Postnatal Depression Scale Total 1    Post partum course:  Patient had an uncomplicated postpartum course.  By time of discharge on PPD#1, her pain was controlled on oral pain medications; she had appropriate lochia and was ambulating, voiding without difficulty and tolerating regular diet.  She was deemed stable for discharge to home.    Discharge Physical Exam:  BP 116/72 (BP Location: Left Arm)   Pulse 80   Temp 98.4 F (36.9 C) (Oral)   Resp 20   Ht $R'4\' 11"'ue$  (1.499 m)   Wt 79.4 kg   LMP 01/26/2020 (Approximate)   SpO2 98%   Breastfeeding Unknown   BMI 35.35 kg/m   General: NAD CV: RRR Pulm: CTABL, nl effort ABD: s/nd/nt, fundus firm and below the umbilicus Lochia: moderate Incision: c/d/i DVT Evaluation: LE non-ttp, no evidence of DVT on exam.  Hemoglobin  Date Value Ref Range Status  11/06/2020 11.6 (L) 12.0 - 15.0 g/dL Final  06/27/2020 12.3 11.1 - 15.9 g/dL Final  06/27/2020 12.0 11.1 - 15.9 g/dL Final   HCT  Date Value Ref Range Status  11/06/2020 33.2 (L) 36.0 - 46.0 % Final  Hematocrit  Date Value Ref Range Status  06/27/2020 36.4 34.0 - 46.6 % Final  06/27/2020 35.1 34.0 - 46.6 % Final     Disposition: stable, discharge to home. Baby Feeding: breastmilk and formula Baby Disposition: transferred to [facility] in [facility] d/t meconium aspiration syndrome   Rh Immune globulin given: Rh pos Rubella vaccine given: MMR vaccine x 2 Tdap vaccine given in AP or PP setting: received 09/13/2020 Flu vaccine  given in AP or PP setting: received 09/13/2020  Contraception: desires BTL  Prenatal Labs:  Blood type/Rh O pos  Antibody screen neg  Rubella Pending  Varicella Non-immune - varivax postpartum   RPR NR  HBsAg Pending  HIV NR  GC neg  Chlamydia neg  Genetic screening Quad 06/27/20 pos Down Syndrome [id number] Negative  1 hour GTT 195  3 hour GTT 78, 203, 185, 128  GBS Neg      Plan:  Dorianne Perret was discharged to home in good condition. Follow-up appointment with delivering provider in 2-4 weeks for pre-op.  Discharge Medications: Allergies as of 11/06/2020   No Known Allergies     Medication List    STOP taking these medications   Accu-Chek Nano SmartView w/Device Kit   Accu-Chek SmartView test strip Generic drug: glucose blood   Accu-Chek Softclix Lancets lancets     TAKE these medications   acetaminophen 325 MG tablet Commonly known as: TYLENOL Take 2 tablets (650 mg total) by mouth every 4 (four) hours as needed (for pain scale < 4ORtemperature>/=100.5 F).   calcium carbonate 500 MG chewable tablet Commonly known as: TUMS - dosed in mg elemental calcium Chew 1,000 mg by mouth daily as needed for heartburn or indigestion.   ibuprofen 600 MG tablet Commonly known as: ADVIL Take 1 tablet (600 mg total) by mouth every 6 (six) hours as needed for mild pain or moderate pain.   PRENATAL PO Take 1 tablet by mouth daily.        Follow-up Information    Schermerhorn, Gwen Her, MD. Schedule an appointment as soon as possible for a visit in 2 week(s).   Specialty: Obstetrics and Gynecology Why: for pre-op visit.  May be in next 2-4 weeks.  Contact information: [address] [place] [address] [phone]               Signed:  Drinda Butts, CNM Certified Nurse Midwife [facility]

## 2020-11-05 NOTE — Progress Notes (Signed)
Labor Progress Note  Kathy Benton is a 30 y.o. [id number] at [redacted]w[redacted]d admitted for active labor  Subjective: Uncontrolled with UCs  Called to [facility] for precip delivery.  Informed of recent prolong decel x 6 min which was back to baseline at time at bedside. SVE 9/100/+1.  Second prolong - scalp stem unaffective, Dr. Feliberto Gottron called, Terb, FSE, and position change to hands and knees. Prolong decel for 6 mins with resolution to baseline with moderate variability. Dr. Feliberto Gottron at bedside with in 1 min.  Objective: LMP 01/26/2020 (Approximate)    Membrane status:SROM Amniotic color: thick mec  Labs: Lab Results  Component Value Date   WBC 8.8 11/05/2020   HGB 12.8 11/05/2020   HCT 37.3 11/05/2020   MCV 91.4 11/05/2020   PLT 233 11/05/2020    Assessment / Plan: Spontaneous labor, progressing quickly  Labor: Precip Preeclampsia:  130/73 Fetal Wellbeing:  Category II Pain Control:  Labor support without medications I/D:  n/a Anticipated MOD:  NSVD  Cyril Mourning, CNM 11/05/2020, 5:04 PM

## 2020-11-05 NOTE — Progress Notes (Signed)
Photos taken for pt of her baby in the nursery. Nursery has pts phone number and ipad available for communication

## 2020-11-05 NOTE — Progress Notes (Signed)
Labs drawn and sent to lab.

## 2020-11-05 NOTE — H&P (Signed)
OB History & Physical   History of Present Illness:  Chief Complaint:   HPI:  Kathy Benton is a 30 y.o. [id number] female at [redacted]w[redacted]d.  She presents to L&D for active labor and SROM.  She reports:  -active fetal movement -LOF/SROM at  -no vaginal bleeding -contractions  Pregnancy Issues: 1. GDM diet controlled 2. Quad positive for Down Syndrome 3. Obesity 4. Non-compliant patient 5. Varicella non-immune 6. Late prenatal care (21 week) 7. H/o CHTN   Maternal Medical History:   Past Medical History:  Diagnosis Date  . Gestational diabetes 2019   previous pregnancy and current pregnancy   . Headache    migraines  . Hypertension    previous pregnancy     No past surgical history on file.  No Known Allergies  Prior to Admission medications   Medication Sig Start Date End Date Taking? Authorizing Provider  calcium carbonate (TUMS - DOSED IN MG ELEMENTAL CALCIUM) 500 MG chewable tablet Chew 1,000 mg by mouth daily as needed for heartburn or indigestion.   Yes [provider]  Prenatal Vit-Fe Fumarate-FA (PRENATAL PO) Take 1 tablet by mouth daily.   Yes [provider]  Accu-Chek Softclix Lancets lancets 120 each by Other route 4 (four) times daily. Patient not taking: No sig reported 09/09/20   Caren Macadam, MD  Blood Glucose Monitoring Suppl ([place]) w/Device KIT 1 kit by Subdermal route as directed. Check blood sugars for fasting, and two hours after breakfast, lunch and dinner (4 checks daily) Patient not taking: No sig reported 09/09/20   Caren Macadam, MD  glucose blood (ACCU-CHEK SMARTVIEW) test strip Use as instructed to check blood sugars Patient not taking: No sig reported 09/09/20   Caren Macadam, MD     Prenatal care site: [facility]   Social History: She  reports that she has never smoked. She has never used smokeless tobacco. She reports previous alcohol use. She reports that she  does not use drugs.  Family History: family history is not on file.   Review of Systems: A full review of systems was performed and negative except as noted in the HPI.    Physical Exam:  Vital Signs: LMP 01/26/2020 (Approximate)   General:   alert and cooperative  Skin:  normal  Neurologic:    Alert & oriented x 3  Lungs:   nl effort  Heart:   regularly irregular rhythm  Abdomen:  gravid  Extremities: : non-tender, symmetric    EFW: 10/09/20   Est. FW:    3246  gm      7 lb 2 oz     77  %  Results for orders placed or performed during the hospital encounter of 11/05/20 (from the past 24 hour(s))  Type and screen [place]     Status: None (Preliminary result)   Collection Time: 11/05/20  4:05 PM  Result Value Ref Range   ABO/RH(D) PENDING    Antibody Screen PENDING    Sample Expiration      11/08/2020,2359 Performed at [facility], [address] [place], [place] [address]   CBC with Differential/Platelet     Status: None   Collection Time: 11/05/20  4:05 PM  Result Value Ref Range   WBC 8.8 4.0 - 10.5 K/uL   RBC 4.08 3.87 - 5.11 MIL/uL   Hemoglobin 12.8 12.0 - 15.0 g/dL   HCT 37.3 36.0 - 46.0 %   MCV 91.4  80.0 - 100.0 fL   MCH 31.4 26.0 - 34.0 pg   MCHC 34.3 30.0 - 36.0 g/dL   RDW 13.7 11.5 - 15.5 %   Platelets 233 150 - 400 K/uL   nRBC 0.0 0.0 - 0.2 %   Neutrophils Relative % 74 %   Neutro Abs 6.6 1.7 - 7.7 K/uL   Lymphocytes Relative 14 %   Lymphs Abs 1.3 0.7 - 4.0 K/uL   Monocytes Relative 10 %   Monocytes Absolute 0.8 0.1 - 1.0 K/uL   Eosinophils Relative 1 %   Eosinophils Absolute 0.1 0.0 - 0.5 K/uL   Basophils Relative 0 %   Basophils Absolute 0.0 0.0 - 0.1 K/uL   Immature Granulocytes 1 %   Abs Immature Granulocytes 0.04 0.00 - 0.07 K/uL    Pertinent Results:  Prenatal Labs: Blood type/Rh O pos  Antibody screen neg  Rubella Pending  Varicella Immune  RPR NR  HBsAg Pending  HIV NR  GC neg  Chlamydia neg   Genetic screening Quad 06/27/20 pos Down Syndrome MaterniT21 Negative  1 hour GTT 195  3 hour GTT 78, 203, 185, 128  GBS Neg   FHT: FHR: 140 bpm, variability: moderate,  accelerations:  Abscent,  decelerations:  Present Prolonged Category/reactivity:  Category II TOCO: regular SVE: Dilation: 10 /   /      Cephalic by leopolds  [place] MFM FETAL BPP WO NON STRESS  Result Date: 10/09/2020 ----------------------------------------------------------------------  OBSTETRICS REPORT                       (Signed Final 10/09/2020 04:37 pm) ---------------------------------------------------------------------- Patient Info  ID #:       [id number]                          D.O.B.:  02-01-1991 (29 yrs)  Name:       Kathy Benton                  Visit Date: 10/09/2020 02:53 pm ---------------------------------------------------------------------- Performed By  Attending:        Tama High MD        Referred By:      Kandee Keen  Performed By:     Christena Deem RDMS        [facility]                                                             [facility] ---------------------------------------------------------------------- Orders  #  Description                           Code        Ordered By  1  [place] MFM OB FOLLOW UP  17494.49    [place]  2  [place] MFM FETAL BPP WO NON               76819.01    JENNA STAPLES     STRESS ----------------------------------------------------------------------  #  Order #                     Accession #                Episode #  1  [id number]                   [id number]                 [id number]  2  [id number]                   [id number]                 [id number] ---------------------------------------------------------------------- Indications  [redacted] weeks gestation of pregnancy                Z3A.36  Abnormal biochemical screen                    O28.9  Gestational  diabetes in pregnancy, diet        O24.410  controlled  Obesity complicating pregnancy, third          O99.213  trimester ---------------------------------------------------------------------- Fetal Evaluation  Num Of Fetuses:         1  Fetal Heart Rate(bpm):  118  Cardiac Activity:       Observed  Presentation:           Cephalic  Placenta:               Anterior  P. Cord Insertion:      Visualized, central  AFI Sum(cm)     %Tile       Largest Pocket(cm)  14              52          4.6  RUQ(cm)       RLQ(cm)       LUQ(cm)        LLQ(cm)  3.1           3.4           2.9            4.6 ---------------------------------------------------------------------- Biophysical Evaluation  Amniotic F.V:   Within normal limits       F. Tone:        Observed  F. Movement:    Observed                   Score:          8/8  F. Breathing:   Observed ---------------------------------------------------------------------- Biometry  BPD:      89.8  mm     G. Age:  36w 3d         53  %    CI:        76.04   %    70 - 86  FL/HC:      22.7   %    20.8 - 22.6  HC:      326.4  mm     G. Age:  37w 0d         29  %    HC/AC:      0.96        0.92 - 1.05  AC:      338.8  mm     G. Age:  37w 5d         87  %    FL/BPD:     82.6   %    71 - 87  FL:       74.2  mm     G. Age:  38w 0d         78  %    FL/AC:      21.9   %    20 - 24  LV:        2.9  mm  Est. FW:    3246  gm      7 lb 2 oz     77  % ---------------------------------------------------------------------- OB History  Gravidity:    6  Living:       4 ---------------------------------------------------------------------- Gestational Age  LMP:           36w 5d        Date:  01/26/20                 EDD:   11/01/20  U/S Today:     37w 2d                                        EDD:   10/28/20  Best:          36w 5d     Det. By:  LMP  (01/26/20)          EDD:   11/01/20  ---------------------------------------------------------------------- Anatomy  Cranium:               Appears normal         Aortic Arch:            Previously seen  Cavum:                 Appears normal         Ductal Arch:            Previously seen  Ventricles:            Appears normal         Diaphragm:              Appears normal  Choroid Plexus:        Appears normal         Stomach:                Appears normal, left                                                                        sided  Cerebellum:  Previously seen        Abdomen:                Previously seen  Posterior Fossa:       Previously seen        Abdominal Wall:         Previously seen  Nuchal Fold:           Previously seen        Cord Vessels:           Appears normal (3                                                                        vessel cord)  Face:                  Appears normal         Kidneys:                Appear normal                         (orbits and profile)  Lips:                  Previously seen        Bladder:                Appears normal  Heart:                 Appears normal         Spine:                  Previously seen                         (4CH, axis, and                         situs)  RVOT:                  Appears normal         Upper Extremities:      Previously seen  LVOT:                  Appears normal         Lower Extremities:      Previously seen ---------------------------------------------------------------------- Impression  Patient with abnormal serum analytes detected on  midtrimester screening return for fetal growth assessment.  She has a recent diagnosis of gestational diabetes.  Patient  has not been checking her blood glucose regularly.  I am unable to ascertain her blood glucose control.  Fetal growth is appropriate for gestational age.  Amniotic fluid  is normal and good fetal activity seen.  Antenatal testing is  reassuring.  BPP 8/8.  Cephalic presentation.  I  emphasized the importance of good blood glucose control  to prevent adverse fetal or neonatal outcomes.  I have  recommend weekly antenatal testing because of uncertain  control.  Patient was encouraged to bring her blood glucose  log at her next visit. ---------------------------------------------------------------------- Recommendations  -Weekly BPP till delivery.  -We will recommend  timing of delivery based on her blood  glucose log. ----------------------------------------------------------------------                  Tama High, MD Electronically Signed Final Report   10/09/2020 04:37 pm ----------------------------------------------------------------------  [place] MFM OB FOLLOW UP  Result Date: 10/09/2020 ----------------------------------------------------------------------  OBSTETRICS REPORT                       (Signed Final 10/09/2020 04:37 pm) ---------------------------------------------------------------------- Patient Info  ID #:       [id number]                          D.O.B.:  08-10-1991 (29 yrs)  Name:       Kathy Benton                  Visit Date: 10/09/2020 02:53 pm ---------------------------------------------------------------------- Performed By  Attending:        Tama High MD        Referred By:      Kandee Keen  Performed By:     Christena Deem RDMS        [facility] at                                                             [facility] ---------------------------------------------------------------------- Orders  #  Description                           Code        Ordered By  1  [place] MFM OB FOLLOW UP                   76816.01    [place]  2  [place] MFM FETAL BPP WO NON               76819.01    JENNA STAPLES     STRESS ----------------------------------------------------------------------  #  Order #                     Accession #                Episode #  1  [id number]                    [id number]                 [id number]  2  [id number]                   [id number]                 [id number] ---------------------------------------------------------------------- Indications  [redacted] weeks gestation of pregnancy  Z3A.36  Abnormal biochemical screen                    O28.9  Gestational diabetes in pregnancy, diet        O24.410  controlled  Obesity complicating pregnancy, third          O99.213  trimester ---------------------------------------------------------------------- Fetal Evaluation  Num Of Fetuses:         1  Fetal Heart Rate(bpm):  118  Cardiac Activity:       Observed  Presentation:           Cephalic  Placenta:               Anterior  P. Cord Insertion:      Visualized, central  AFI Sum(cm)     %Tile       Largest Pocket(cm)  14              52          4.6  RUQ(cm)       RLQ(cm)       LUQ(cm)        LLQ(cm)  3.1           3.4           2.9            4.6 ---------------------------------------------------------------------- Biophysical Evaluation  Amniotic F.V:   Within normal limits       F. Tone:        Observed  F. Movement:    Observed                   Score:          8/8  F. Breathing:   Observed ---------------------------------------------------------------------- Biometry  BPD:      89.8  mm     G. Age:  36w 3d         53  %    CI:        76.04   %    70 - 86                                                          FL/HC:      22.7   %    20.8 - 22.6  HC:      326.4  mm     G. Age:  37w 0d         29  %    HC/AC:      0.96        0.92 - 1.05  AC:      338.8  mm     G. Age:  37w 5d         87  %    FL/BPD:     82.6   %    71 - 87  FL:       74.2  mm     G. Age:  38w 0d         78  %    FL/AC:      21.9   %    20 - 24  LV:        2.9  mm  Est. FW:    3246  gm  7 lb 2 oz     77  % ---------------------------------------------------------------------- OB History  Gravidity:    6  Living:       4  ---------------------------------------------------------------------- Gestational Age  LMP:           36w 5d        Date:  01/26/20                 EDD:   11/01/20  U/S Today:     37w 2d                                        EDD:   10/28/20  Best:          36w 5d     Det. By:  LMP  (01/26/20)          EDD:   11/01/20 ---------------------------------------------------------------------- Anatomy  Cranium:               Appears normal         Aortic Arch:            Previously seen  Cavum:                 Appears normal         Ductal Arch:            Previously seen  Ventricles:            Appears normal         Diaphragm:              Appears normal  Choroid Plexus:        Appears normal         Stomach:                Appears normal, left                                                                        sided  Cerebellum:            Previously seen        Abdomen:                Previously seen  Posterior Fossa:       Previously seen        Abdominal Wall:         Previously seen  Nuchal Fold:           Previously seen        Cord Vessels:           Appears normal (3                                                                        vessel cord)  Face:  Appears normal         Kidneys:                Appear normal                         (orbits and profile)  Lips:                  Previously seen        Bladder:                Appears normal  Heart:                 Appears normal         Spine:                  Previously seen                         (4CH, axis, and                         situs)  RVOT:                  Appears normal         Upper Extremities:      Previously seen  LVOT:                  Appears normal         Lower Extremities:      Previously seen ---------------------------------------------------------------------- Impression  Patient with abnormal serum analytes detected on  midtrimester screening return for fetal growth assessment.  She has a recent diagnosis of  gestational diabetes.  Patient  has not been checking her blood glucose regularly.  I am unable to ascertain her blood glucose control.  Fetal growth is appropriate for gestational age.  Amniotic fluid  is normal and good fetal activity seen.  Antenatal testing is  reassuring.  BPP 8/8.  Cephalic presentation.  I emphasized the importance of good blood glucose control  to prevent adverse fetal or neonatal outcomes.  I have  recommend weekly antenatal testing because of uncertain  control.  Patient was encouraged to bring her blood glucose  log at her next visit. ---------------------------------------------------------------------- Recommendations  -Weekly BPP till delivery.  -We will recommend timing of delivery based on her blood  glucose log. ----------------------------------------------------------------------                  Tama High, MD Electronically Signed Final Report   10/09/2020 04:37 pm ----------------------------------------------------------------------    Assessment:  Keasha Malkiewicz is a 31 y.o. [id number] female at [redacted]w[redacted]d with active labor and SROM.   Plan:  1. Admit to Labor & Delivery; consents reviewed and obtained  2. Fetal Well being  - Fetal Tracing: Cat II - GBS neg - Presentation: vtx confirmed by SVE   3. Routine OB: - Prenatal labs reviewed, as above - Rh pos - CBC & T&S on admit - Clear fluids, IVF  4. Monitoring of Labor -  Contractions external toco in place -  Pelvis proven to 7# -  Plan for continuous fetal monitoring  - Anticipate vaginal delivery    Linda Hedges, CNM 11/05/2020 4:56 PM

## 2020-11-05 NOTE — Progress Notes (Signed)
Per [facility]. [facility] home visit to client yesterday, patient reported flu-like/covid like symptoms and was instructed to go to ED for evaluation. No flu or covid test done in ED. Patient needs covid testing before coming to [facility] due to symptoms. TC to patient to inform her of need for [facility] appointment weekly during last month of pregnancy, and needs appointment now. Patient also needs covid test before [facility] appointment due to reported symptoms. LM for patient with number to call. Patient is scheduled for IOL on 11/08/2020.Marland KitchenBurt Knack, RN

## 2020-11-05 NOTE — Progress Notes (Signed)
X ray

## 2020-11-06 LAB — CBC
HCT: 33.2 % — ABNORMAL LOW (ref 36.0–46.0)
Hemoglobin: 11.6 g/dL — ABNORMAL LOW (ref 12.0–15.0)
MCH: 31.5 pg (ref 26.0–34.0)
MCHC: 34.9 g/dL (ref 30.0–36.0)
MCV: 90.2 fL (ref 80.0–100.0)
Platelets: 225 10*3/uL (ref 150–400)
RBC: 3.68 MIL/uL — ABNORMAL LOW (ref 3.87–5.11)
RDW: 13.8 % (ref 11.5–15.5)
WBC: 10.8 10*3/uL — ABNORMAL HIGH (ref 4.0–10.5)
nRBC: 0 % (ref 0.0–0.2)

## 2020-11-06 LAB — RPR: RPR Ser Ql: NONREACTIVE

## 2020-11-06 MED ORDER — IBUPROFEN 600 MG PO TABS: 600.0000 mg | ORAL_TABLET | Freq: Four times a day (QID) | ORAL | 1 refills | Status: DC | PRN

## 2020-11-06 MED ORDER — IBUPROFEN 600 MG PO TABS
600.0000 mg | ORAL_TABLET | Freq: Four times a day (QID) | ORAL | Status: DC
  Administered 2020-11-06 (×2): 600 mg via ORAL
  Filled 2020-11-06 (×2): qty 1

## 2020-11-06 MED ORDER — VARICELLA VIRUS VACCINE LIVE 1350 PFU/0.5ML IJ SUSR
0.5000 mL | INTRAMUSCULAR | Status: AC | PRN
  Administered 2020-11-06: 0.5 mL via SUBCUTANEOUS
  Filled 2020-11-06: qty 0.5

## 2020-11-06 MED ORDER — ACETAMINOPHEN 325 MG PO TABS: 650.0000 mg | ORAL_TABLET | ORAL | Status: DC | PRN

## 2020-11-06 NOTE — Discharge Instructions (Signed)
Postpartum Care After Vaginal Delivery This sheet gives you information about how to care for yourself from the time you deliver your baby to up to 6-12 weeks after delivery (postpartum period). Your health care provider may also give you more specific instructions. If you have problems or questions, contact your health care provider. Follow these instructions at home: Vaginal bleeding  It is normal to have vaginal bleeding (lochia) after delivery. Wear a sanitary pad for vaginal bleeding and discharge. ? During the first week after delivery, the amount and appearance of lochia is often similar to a menstrual period. ? Over the next few weeks, it will gradually decrease to a dry, yellow-brown discharge. ? For most women, lochia stops completely by 4-6 weeks after delivery. Vaginal bleeding can vary from woman to woman.  Change your sanitary pads frequently. Watch for any changes in your flow, such as: ? A sudden increase in volume. ? A change in color. ? Large blood clots.  If you pass a blood clot from your vagina, save it and call your health care provider to discuss. Do not flush blood clots down the toilet before talking with your health care provider.  Do not use tampons or douches until your health care provider says this is safe.  If you are not breastfeeding, your period should return 6-8 weeks after delivery. If you are feeding your child breast milk only (exclusive breastfeeding), your period may not return until you stop breastfeeding. Perineal care  Keep the area between the vagina and the anus (perineum) clean and dry as told by your health care provider. Use medicated pads and pain-relieving sprays and creams as directed.  If you had a cut in the perineum (episiotomy) or a tear in the vagina, check the area for signs of infection until you are healed. Check for: ? More redness, swelling, or pain. ? Fluid or blood coming from the cut or tear. ? Warmth. ? Pus or a bad  smell.  You may be given a squirt bottle to use instead of wiping to clean the perineum area after you go to the bathroom. As you start healing, you may use the squirt bottle before wiping yourself. Make sure to wipe gently.  To relieve pain caused by an episiotomy, a tear in the vagina, or swollen veins in the anus (hemorrhoids), try taking a warm sitz bath 2-3 times a day. A sitz bath is a warm water bath that is taken while you are sitting down. The water should only come up to your hips and should cover your buttocks. Breast care  Within the first few days after delivery, your breasts may feel heavy, full, and uncomfortable (breast engorgement). Milk may also leak from your breasts. Your health care provider can suggest ways to help relieve the discomfort. Breast engorgement should go away within a few days.  If you are breastfeeding: ? Wear a bra that supports your breasts and fits you well. ? Keep your nipples clean and dry. Apply creams and ointments as told by your health care provider. ? You may need to use breast pads to absorb milk that leaks from your breasts. ? You may have uterine contractions every time you breastfeed for up to several weeks after delivery. Uterine contractions help your uterus return to its normal size. ? If you have any problems with breastfeeding, work with your health care provider or lactation consultant.  If you are not breastfeeding: ? Avoid touching your breasts a lot. Doing this can make   your breasts produce more milk. ? Wear a good-fitting bra and use cold packs to help with swelling. ? Do not squeeze out (express) milk. This causes you to make more milk. Intimacy and sexuality  Ask your health care provider when you can engage in sexual activity. This may depend on: ? Your risk of infection. ? How fast you are healing. ? Your comfort and desire to engage in sexual activity.  You are able to get pregnant after delivery, even if you have not had  your period. If desired, talk with your health care provider about methods of birth control (contraception). Medicines  Take over-the-counter and prescription medicines only as told by your health care provider.  If you were prescribed an antibiotic medicine, take it as told by your health care provider. Do not stop taking the antibiotic even if you start to feel better. Activity  Gradually return to your normal activities as told by your health care provider. Ask your health care provider what activities are safe for you.  Rest as much as possible. Try to rest or take a nap while your baby is sleeping. Eating and drinking   Drink enough fluid to keep your urine pale yellow.  Eat high-fiber foods every day. These may help prevent or relieve constipation. High-fiber foods include: ? Whole grain cereals and breads. ? Brown rice. ? Beans. ? Fresh fruits and vegetables.  Do not try to lose weight quickly by cutting back on calories.  Take your prenatal vitamins until your postpartum checkup or until your health care provider tells you it is okay to stop. Lifestyle  Do not use any products that contain nicotine or tobacco, such as cigarettes and e-cigarettes. If you need help quitting, ask your health care provider.  Do not drink alcohol, especially if you are breastfeeding. General instructions  Keep all follow-up visits for you and your baby as told by your health care provider. Most women visit their health care provider for a postpartum checkup within the first 3-6 weeks after delivery. Contact a health care provider if:  You feel unable to cope with the changes that your child brings to your life, and these feelings do not go away.  You feel unusually sad or worried.  Your breasts become red, painful, or hard.  You have a fever.  You have trouble holding urine or keeping urine from leaking.  You have little or no interest in activities you used to enjoy.  You have not  breastfed at all and you have not had a menstrual period for 12 weeks after delivery.  You have stopped breastfeeding and you have not had a menstrual period for 12 weeks after you stopped breastfeeding.  You have questions about caring for yourself or your baby.  You pass a blood clot from your vagina. Get help right away if:  You have chest pain.  You have difficulty breathing.  You have sudden, severe leg pain.  You have severe pain or cramping in your lower abdomen.  You bleed from your vagina so much that you fill more than one sanitary pad in one hour. Bleeding should not be heavier than your heaviest period.  You develop a severe headache.  You faint.  You have blurred vision or spots in your vision.  You have bad-smelling vaginal discharge.  You have thoughts about hurting yourself or your baby. If you ever feel like you may hurt yourself or others, or have thoughts about taking your own life, get help  right away. You can go to the nearest emergency department or call:  Your local emergency services (911 in the [place]  A suicide crisis helpline, such as the National Suicide Prevention Lifeline at 1-800-273-8255. This is open 24 hours a day. Summary  The period of time right after you deliver your newborn up to 6-12 weeks after delivery is called the postpartum period.  Gradually return to your normal activities as told by your health care provider.  Keep all follow-up visits for you and your baby as told by your health care provider. This information is not intended to replace advice given to you by your health care provider. Make sure you discuss any questions you have with your health care provider. Document Revised: 10/21/2017 Document Reviewed: 08/01/2017 Elsevier Patient Education  2020 [employer]  Postpartum Baby Blues The postpartum period begins right after the birth of a baby. During this time, there is often a lot of joy and excitement. It is also a  time of many changes in the life of the parents. No matter how many times a mother gives birth, each child brings new challenges to the family, including different ways of relating to one another. It is common to have feelings of excitement along with confusing changes in moods, emotions, and thoughts. You may feel happy one minute and sad or stressed the next. These feelings of sadness usually happen in the period right after you have your baby, and they go away within a week or two. This is called the "baby blues." What are the causes? There is no known cause of baby blues. It is likely caused by a combination of factors. However, changes in hormone levels after childbirth are believed to trigger some of the symptoms. Other factors that can play a role in these mood changes include:  Lack of sleep.  Stressful life events, such as poverty, caring for a loved one, or death of a loved one.  Genetics. What are the signs or symptoms? Symptoms of this condition include:  Brief changes in mood, such as going from extreme happiness to sadness.  Decreased concentration.  Difficulty sleeping.  Crying spells and tearfulness.  Loss of appetite.  Irritability.  Anxiety. If the symptoms of baby blues last for more than 2 weeks or become more severe, you may have postpartum depression. How is this diagnosed? This condition is diagnosed based on an evaluation of your symptoms. There are no medical or lab tests that lead to a diagnosis, but there are various questionnaires that a health care provider may use to identify women with the baby blues or postpartum depression. How is this treated? Treatment is not needed for this condition. The baby blues usually go away on their own in 1-2 weeks. Social support is often all that is needed. You will be encouraged to get adequate sleep and rest. Follow these instructions at home: Lifestyle      Get as much rest as you can. Take a nap when the baby  sleeps.  Exercise regularly as told by your health care provider. Some women find yoga and walking to be helpful.  Eat a balanced and nourishing diet. This includes plenty of fruits and vegetables, whole grains, and lean proteins.  Do little things that you enjoy. Have a cup of tea, take a bubble bath, read your favorite magazine, or listen to your favorite music.  Avoid alcohol.  Ask for help with household chores, cooking, grocery shopping, or running errands. Do not try to   everything yourself. Consider hiring a postpartum doula to help. This is a professional who specializes in providing support to new mothers.  Try not to make any major life changes during pregnancy or right after giving birth. This can add stress. General instructions  Talk to people close to you about how you are feeling. Get support from your partner, family members, friends, or other new moms. You may want to join a support group.  Find ways to cope with stress. This may include: ? Writing your thoughts and feelings in a journal. ? Spending time outside. ? Spending time with people who make you laugh.  Try to stay positive in how you think. Think about the things you are grateful for.  Take over-the-counter and prescription medicines only as told by your health care provider.  Let your health care provider know if you have any concerns.  Keep all postpartum visits as told by your health care provider. This is important. Contact a health care provider if:  Your baby blues do not go away after 2 weeks. Get help right away if:  You have thoughts of taking your own life (suicidal thoughts).  You think you may harm the baby or other people.  You see or hear things that are not there (hallucinations). Summary  After giving birth, you may feel happy one minute and sad or stressed the next. Feelings of sadness that happen right after the baby is born and go away after a week or two are called the "baby  blues."  You can manage the baby blues by getting enough rest, eating a healthy diet, exercising, spending time with supportive people, and finding ways to cope with stress.  If feelings of sadness and stress last longer than 2 weeks or get in the way of caring for your baby, talk to your health care provider. This may mean you have postpartum depression. This information is not intended to replace advice given to you by your health care provider. Make sure you discuss any questions you have with your health care provider. Document Revised: 02/09/2019 Document Reviewed: 12/14/2016 Elsevier Patient Education  [place]. Paced Infant Bottle Feeding Paced bottle feeding is a way to bottle feed your baby that is more like breastfeeding. This type of feeding helps your baby learn to eat more slowly and stop when full. This can help prevent overfeeding and discomfort. Paced bottle feeding may also help your baby feel comfortable with both bottle feeding and breastfeeding. The goal is to provide a bottle feeding that is similar to the pace and flow of breast milk from the breast. This is done by holding the bottle in a way that controls the flow of milk, and by taking periodic breaks during feedings. Paced bottle feeding works well if you want to continue breastfeeding but will sometimes need to bottle feed your baby with pumped breast milk or formula. You may also want to consider this method if:  You are unable to breastfeed.  You want others to feed your baby, such as if you are returning to work. How to plan for paced bottle feeding Before you start bottle feeding, talk to your child's health care provider or a Science writer. Ask what type of formula and bottle would work best for your baby. In general, a 7-ounce bottle with a slow flow nipple works well. If you are going to pump breast milk, ask how often you should pump, and learn how to pump and store your milk safely.  Plan to  bottle feed on demand, which means feeding whenever your baby shows signs of being hungry. Your baby may:  Put fingers or a hand into his or her mouth.  Clench fists over the tummy or flex arms and legs.  Turn the head and open the mouth as if looking for a nipple (rooting).  Make sucking noises.  Cry. This is usually a late sign of hunger.  Act fussy or restless. How to prepare the bottle To get the bottle ready for bottle feeding:  Wash your hands.  Make sure the bottle and nipple are clean. ? If you are using the bottle for the first time, sterilize all parts in boiling water for 10 minutes; cool and air-dry. ? After the first use, you can clean all the bottle parts in hot, soapy water and rinse thoroughly once a day.  If you are using formula, follow the directions for mixing the formula or the instructions from your child's health care provider.  If you are using breast milk, thaw the milk in the refrigerator. Do not use breast milk after it has been thawed or stored in the refrigerator for longer than 24 hours.  You may heat the bottle in warm water. Make sure it is not too hot or too cold. Never use a microwave to warm up a bottle. How to perform paced bottle feeding Follow these steps for paced bottle feeding: 1. Hold your baby close to your body at a slight angle, or semi-upright, as you would for breastfeeding. Your baby's head should be higher than his or her stomach. 2. Support your baby's head in the crook of your arm. 3. Place the nipple on your baby's cheek. Let your baby root around to find the nipple. You may tickle or stroke your baby's lips with the nipple to stimulate rooting. Let your baby draw the bottle nipple into the mouth on his or her own, just like the baby does with breastfeeding. 4. Once your baby latches on to the nipple, hold the bottle flat, parallel to the floor. This will help your baby control the flow of milk so that it does not come out too  fast. 5. Tip the bottle slightly to let about half the nipple fill. Do not tilt the bottle straight up into the air. This will force too much milk or formula into your baby's mouth. 6. After about three to five sucks, tilt the bottle back to flat, wait a few seconds, and tilt back up slightly. Continue tilting and pausing. This allows your baby to pace the feeding. 7. About halfway through the feeding, switch arms so you are holding your baby on the other side. This is similar to switching breasts when breastfeeding. 8. Watch for signs that your baby is full. When your baby has had enough to eat, he or she may: ? Eat more slowly or stop. ? Become distracted. ? Turn away and stop sucking. ? Become very relaxed or fall asleep. ? Have his or her hands open and relaxed. 9. When it is time to stop, gently remove the nipple from your baby's mouth. Offer the nipple again and let your baby feed for about three to five sucks. Remove the nipple again. Keep offering and removing until your baby refuses the nipple or no longer sucks. 10. Try to have a bottle feeding last about the same amount of time as a breastfeeding session. This is usually around 15-20 minutes. General tips  Watch for the following  signs that your baby may be overfeeding or eating too quickly: ? Gulping. ? Drooling. ? Noisy feeding. ? Coughing or choking.  Start paced bottle feeding on demand. Over time, your baby will become hungry at predictable times.  Feed your baby in a quiet and comfortable place. Avoid distractions. Pay attention to pacing and signs of fullness.  Do not bottle feed your baby anything other than breast milk or formula until your baby's health care provider says that you can start other feedings.  Let your baby's health care provider know if your baby: ? Is fussy or seems uncomfortable after feeding. ? Vomits after feedings. ? Refuses to take the bottle or your breast. Where to find more information [place]  Department of Health and Human Services: https://torres-moran.org/ Summary  Paced bottle feeding is a way to bottle feed your baby that is more like breastfeeding.  Paced bottle feeding helps your baby learn to eat only when hungry and to avoid overeating.  Ask your baby's health care provider to recommend types of bottles and formula. If you plan to use pumped breast milk, learn how to pump and store your milk.  Follow the steps for performing paced feeding and stop when your baby shows signs of being full. This information is not intended to replace advice given to you by your health care provider. Make sure you discuss any questions you have with your health care provider. Document Revised: 08/04/2017 Document Reviewed: 08/04/2017 Elsevier Patient Education  2020 [employer]. Breastfeeding Tips for a Good Latch Latching is how your baby's mouth attaches to your nipple to breastfeed. It is an important part of breastfeeding. Your baby may have trouble latching for a number of reasons. A poor latch may cause you to have cracked or sore nipples or other problems. Follow these instructions at home: How to position your baby  Find a comfortable place to sit or lie down. Your neck and back should be well supported.  If you are seated, place a pillow or rolled-up blanket under your baby. This will bring him or her to the level of your breast.  Make sure that your baby's belly (abdomen) is facing your belly.  Try different positions to find one that works best for you and your baby. How to help your baby latch   To start, gently rub your breast. Move your fingertips in a circle as you massage from your chest wall toward your nipple. This helps milk flow. Keep doing this during feeding if needed.  Position your breast. Hold your breast with four fingers underneath and your thumb above your nipple. Keep your fingers away from your nipple and your baby's mouth. Follow these steps to  help your baby latch: 1. Rub your baby's lips gently with your finger or nipple. 2. When your baby's mouth is open wide enough, quickly bring your baby to your breast and place your whole nipple into your baby's mouth. Place as much of the colored area around your nipple (areola)as possible into your baby's mouth. 3. Your baby's tongue should be between his or her lower gum and your breast. 4. You should be able to see more areola above your baby's upper lip than below the lower lip. 5. When your baby starts sucking, you will feel a gentle pull on your nipple. You should not feel any pain. Be patient. It is common for a baby to suck for about 2-3 minutes to start the flow of breast milk. 6. Make sure that your  baby's mouth is in the right position around your nipple. Your baby's lips should make a seal on your breast and be turned outward.  General instructions  Look for these signs that your baby has latched on to your nipple: ? The baby is quietly tugging or sucking without causing you pain. ? You hear the baby swallow after every 3 or 4 sucks. ? You see movement above and in front of the baby's ears while he or she is sucking.  Be aware of these signs that your baby has not latched on to your nipple: ? The baby makes sucking sounds or smacking sounds while feeding. ? You have nipple pain.  If your baby is not latched well, put your little finger between your baby's gums and your nipple. This will break the seal. Then try to help your baby latch again.  If you keep having problems, get help from a breastfeeding specialist (Advertising copywriter). Contact a doctor if:  You have cracking or soreness in your nipples that lasts longer than 1 week.  You have nipple pain.  Your breasts are filled with too much milk (engorgement), and this does not improve after 48-72 hours.  You have a plugged milk duct and a fever.  You follow the tips for a good latch but you keep having problems or  concerns.  You have a pus-like fluid coming from your breast.  Your baby is not gaining weight.  Your baby loses weight. Summary  Latching is how your baby's mouth attaches to your nipple to breastfeed.  Try different positions for breastfeeding to find one that works best for you and your baby.  A poor latch may cause you to have cracked or sore nipples or other problems. This information is not intended to replace advice given to you by your health care provider. Make sure you discuss any questions you have with your health care provider. Document Revised: 02/07/2019 Document Reviewed: 05/25/2017 Elsevier Patient Education  2020 [employer]. Breastfeeding  Choosing to breastfeed is one of the best decisions you can make for yourself and your baby. A change in hormones during pregnancy causes your breasts to make breast milk in your milk-producing glands. Hormones prevent breast milk from being released before your baby is born. They also prompt milk flow after birth. Once breastfeeding has begun, thoughts of your baby, as well as his or her sucking or crying, can stimulate the release of milk from your milk-producing glands. Benefits of breastfeeding Research shows that breastfeeding offers many health benefits for infants and mothers. It also offers a cost-free and convenient way to feed your baby. For your baby  Your first milk (colostrum) helps your baby's digestive system to function better.  Special cells in your milk (antibodies) help your baby to fight off infections.  Breastfed babies are less likely to develop asthma, allergies, obesity, or type 2 diabetes. They are also at lower risk for sudden infant death syndrome (SIDS).  Nutrients in breast milk are better able to meet your baby's needs compared to infant formula.  Breast milk improves your baby's brain development. For you  Breastfeeding helps to create a very special bond between you and your  baby.  Breastfeeding is convenient. Breast milk costs nothing and is always available at the correct temperature.  Breastfeeding helps to burn calories. It helps you to lose the weight that you gained during pregnancy.  Breastfeeding makes your uterus return faster to its size before pregnancy. It also slows bleeding (lochia)  after you give birth.  Breastfeeding helps to lower your risk of developing type 2 diabetes, osteoporosis, rheumatoid arthritis, cardiovascular disease, and breast, ovarian, uterine, and endometrial cancer later in life. Breastfeeding basics Starting breastfeeding  Find a comfortable place to sit or lie down, with your neck and back well-supported.  Place a pillow or a rolled-up blanket under your baby to bring him or her to the level of your breast (if you are seated). Nursing pillows are specially designed to help support your arms and your baby while you breastfeed.  Make sure that your baby's tummy (abdomen) is facing your abdomen.  Gently massage your breast. With your fingertips, massage from the outer edges of your breast inward toward the nipple. This encourages milk flow. If your milk flows slowly, you may need to continue this action during the feeding.  Support your breast with 4 fingers underneath and your thumb above your nipple (make the letter "C" with your hand). Make sure your fingers are well away from your nipple and your baby's mouth.  Stroke your baby's lips gently with your finger or nipple.  When your baby's mouth is open wide enough, quickly bring your baby to your breast, placing your entire nipple and as much of the areola as possible into your baby's mouth. The areola is the colored area around your nipple. ? More areola should be visible above your baby's upper lip than below the lower lip. ? Your baby's lips should be opened and extended outward (flanged) to ensure an adequate, comfortable latch. ? Your baby's tongue should be between his  or her lower gum and your breast.  Make sure that your baby's mouth is correctly positioned around your nipple (latched). Your baby's lips should create a seal on your breast and be turned out (everted).  It is common for your baby to suck about 2-3 minutes in order to start the flow of breast milk. Latching Teaching your baby how to latch onto your breast properly is very important. An improper latch can cause nipple pain, decreased milk supply, and poor weight gain in your baby. Also, if your baby is not latched onto your nipple properly, he or she may swallow some air during feeding. This can make your baby fussy. Burping your baby when you switch breasts during the feeding can help to get rid of the air. However, teaching your baby to latch on properly is still the best way to prevent fussiness from swallowing air while breastfeeding. Signs that your baby has successfully latched onto your nipple  Silent tugging or silent sucking, without causing you pain. Infant's lips should be extended outward (flanged).  Swallowing heard between every 3-4 sucks once your milk has started to flow (after your let-down milk reflex occurs).  Muscle movement above and in front of his or her ears while sucking. Signs that your baby has not successfully latched onto your nipple  Sucking sounds or smacking sounds from your baby while breastfeeding.  Nipple pain. If you think your baby has not latched on correctly, slip your finger into the corner of your baby's mouth to break the suction and place it between your baby's gums. Attempt to start breastfeeding again. Signs of successful breastfeeding Signs from your baby  Your baby will gradually decrease the number of sucks or will completely stop sucking.  Your baby will fall asleep.  Your baby's body will relax.  Your baby will retain a small amount of milk in his or her mouth.  Your  baby will let go of your breast by himself or herself. Signs from  you  Breasts that have increased in firmness, weight, and size 1-3 hours after feeding.  Breasts that are softer immediately after breastfeeding.  Increased milk volume, as well as a change in milk consistency and color by the fifth day of breastfeeding.  Nipples that are not sore, cracked, or bleeding. Signs that your baby is getting enough milk  Wetting at least 1-2 diapers during the first 24 hours after birth.  Wetting at least 5-6 diapers every 24 hours for the first week after birth. The urine should be clear or pale yellow by the age of 5 days.  Wetting 6-8 diapers every 24 hours as your baby continues to grow and develop.  At least 3 stools in a 24-hour period by the age of 5 days. The stool should be soft and yellow.  At least 3 stools in a 24-hour period by the age of 7 days. The stool should be seedy and yellow.  No loss of weight greater than 10% of birth weight during the first 3 days of life.  Average weight gain of 4-7 oz (113-198 g) per week after the age of 4 days.  Consistent daily weight gain by the age of 5 days, without weight loss after the age of 2 weeks. After a feeding, your baby may spit up a small amount of milk. This is normal. Breastfeeding frequency and duration Frequent feeding will help you make more milk and can prevent sore nipples and extremely full breasts (breast engorgement). Breastfeed when you feel the need to reduce the fullness of your breasts or when your baby shows signs of hunger. This is called "breastfeeding on demand." Signs that your baby is hungry include:  Increased alertness, activity, or restlessness.  Movement of the head from side to side.  Opening of the mouth when the corner of the mouth or cheek is stroked (rooting).  Increased sucking sounds, smacking lips, cooing, sighing, or squeaking.  Hand-to-mouth movements and sucking on fingers or hands.  Fussing or crying. Avoid introducing a pacifier to your baby in the first  4-6 weeks after your baby is born. After this time, you may choose to use a pacifier. Research has shown that pacifier use during the first year of a baby's life decreases the risk of sudden infant death syndrome (SIDS). Allow your baby to feed on each breast as long as he or she wants. When your baby unlatches or falls asleep while feeding from the first breast, offer the second breast. Because newborns are often sleepy in the first few weeks of life, you may need to awaken your baby to get him or her to feed. Breastfeeding times will vary from baby to baby. However, the following rules can serve as a guide to help you make sure that your baby is properly fed:  Newborns (babies 29 weeks of age or younger) may breastfeed every 1-3 hours.  Newborns should not go without breastfeeding for longer than 3 hours during the day or 5 hours during the night.  You should breastfeed your baby a minimum of 8 times in a 24-hour period. Breast milk pumping     Pumping and storing breast milk allows you to make sure that your baby is exclusively fed your breast milk, even at times when you are unable to breastfeed. This is especially important if you go back to work while you are still breastfeeding, or if you are not able  to be present during feedings. Your lactation consultant can help you find a method of pumping that works best for you and give you guidelines about how long it is safe to store breast milk. Caring for your breasts while you breastfeed Nipples can become dry, cracked, and sore while breastfeeding. The following recommendations can help keep your breasts moisturized and healthy:  Avoid using soap on your nipples.  Wear a supportive bra designed especially for nursing. Avoid wearing underwire-style bras or extremely tight bras (sports bras).  Air-dry your nipples for 3-4 minutes after each feeding.  Use only cotton bra pads to absorb leaked breast milk. Leaking of breast milk between feedings  is normal.  Use lanolin on your nipples after breastfeeding. Lanolin helps to maintain your skin's normal moisture barrier. Pure lanolin is not harmful (not toxic) to your baby. You may also hand express a few drops of breast milk and gently massage that milk into your nipples and allow the milk to air-dry. In the first few weeks after giving birth, some women experience breast engorgement. Engorgement can make your breasts feel heavy, warm, and tender to the touch. Engorgement peaks within 3-5 days after you give birth. The following recommendations can help to ease engorgement:  Completely empty your breasts while breastfeeding or pumping. You may want to start by applying warm, moist heat (in the shower or with warm, water-soaked hand towels) just before feeding or pumping. This increases circulation and helps the milk flow. If your baby does not completely empty your breasts while breastfeeding, pump any extra milk after he or she is finished.  Apply ice packs to your breasts immediately after breastfeeding or pumping, unless this is too uncomfortable for you. To do this: ? Put ice in a plastic bag. ? Place a towel between your skin and the bag. ? Leave the ice on for 20 minutes, 2-3 times a day.  Make sure that your baby is latched on and positioned properly while breastfeeding. If engorgement persists after 48 hours of following these recommendations, contact your health care provider or a Advertising copywriter. Overall health care recommendations while breastfeeding  Eat 3 healthy meals and 3 snacks every day. Well-nourished mothers who are breastfeeding need an additional 450-500 calories a day. You can meet this requirement by increasing the amount of a balanced diet that you eat.  Drink enough water to keep your urine pale yellow or clear.  Rest often, relax, and continue to take your prenatal vitamins to prevent fatigue, stress, and low vitamin and mineral levels in your body (nutrient  deficiencies).  Do not use any products that contain nicotine or tobacco, such as cigarettes and e-cigarettes. Your baby may be harmed by chemicals from cigarettes that pass into breast milk and exposure to secondhand smoke. If you need help quitting, ask your health care provider.  Avoid alcohol.  Do not use illegal drugs or marijuana.  Talk with your health care provider before taking any medicines. These include over-the-counter and prescription medicines as well as vitamins and herbal supplements. Some medicines that may be harmful to your baby can pass through breast milk.  It is possible to become pregnant while breastfeeding. If birth control is desired, ask your health care provider about options that will be safe while breastfeeding your baby. Where to find more information: [employer]: www.llli.org Contact a health care provider if:  You feel like you want to stop breastfeeding or have become frustrated with breastfeeding.  Your nipples are  cracked or bleeding.  Your breasts are red, tender, or warm.  You have: ? Painful breasts or nipples. ? A swollen area on either breast. ? A fever or chills. ? Nausea or vomiting. ? Drainage other than breast milk from your nipples.  Your breasts do not become full before feedings by the fifth day after you give birth.  You feel sad and depressed.  Your baby is: ? Too sleepy to eat well. ? Having trouble sleeping. ? More than 561 week old and wetting fewer than 6 diapers in a 24-hour period. ? Not gaining weight by 845 days of age.  Your baby has fewer than 3 stools in a 24-hour period.  Your baby's skin or the white parts of his or her eyes become yellow. Get help right away if:  Your baby is overly tired (lethargic) and does not want to wake up and feed.  Your baby develops an unexplained fever. Summary  Breastfeeding offers many health benefits for infant and mothers.  Try to breastfeed your infant when  he or she shows early signs of hunger.  Gently tickle or stroke your baby's lips with your finger or nipple to allow the baby to open his or her mouth. Bring the baby to your breast. Make sure that much of the areola is in your baby's mouth. Offer one side and burp the baby before you offer the other side.  Talk with your health care provider or lactation consultant if you have questions or you face problems as you breastfeed. This information is not intended to replace advice given to you by your health care provider. Make sure you discuss any questions you have with your health care provider. Document Revised: 01/12/2018 Document Reviewed: 11/19/2016 Elsevier Patient Education  2020 [employer] Breast Pumping Tips Breast pumping is a way to get milk out of your breasts. You will then store the milk for your baby to use when you are away from home. There are three ways to pump. You can:  Use your hand to massage and squeeze your breast (hand expression).  Use a hand-held machine to manually pump your milk.  Use an electric machine to pump your milk. In the beginning you may not get much milk. After a few days your breasts should make more. Pumping can help you start making milk after your baby is born. Pumping helps you to keep making milk when you are away from your baby. When should I pump? You can start pumping soon after your baby is born. Follow these tips:  When you are with your baby: ? Pump after you breastfeed. ? Pump from the free breast while you breastfeed.  When you are away from your baby: ? Pump every 2-3 hours for 15 minutes. ? Pump both breasts at the same time if you can.  If your baby drinks formula, pump around the time your baby gets the formula.  If you drank alcohol, wait 2 hours before you pump.  If you are going to have surgery, ask your doctor when you should pump again. How do I get ready to pump? Take steps to relax. Try these things to help your milk  come in:  Smell your baby's blanket or clothes.  Look at a picture or video of your baby.  Sit in a quiet, private space.  Massage your breast and nipple.  Place a cloth on your breast. The cloth should be warm and a little wet.  Play relaxing music.  Picture  your milk flowing. What are some tips? General tips for pumping breast milk   Always wash your hands before pumping.  If you do not get much milk or if pumping hurts, try different pump settings or a different kind of pump.  Drink enough fluid so your pee (urine) is clear or pale yellow.  Wear clothing that opens in the front or is easy to take off.  Pump milk into a clean bottle or container.  Do not use anything that has nicotine or tobacco. Examples are cigarettes and e-cigarettes. If you need help quitting, ask your doctor. Tips for storing breast milk   Store breast milk in a clean, BPA-free container. These include: ? A glass or plastic bottle. ? A milk storage bag.  Store only 2-4 ounces of breast milk in each container.  Swirl the breast milk in the container. Do not shake it.  Write down the date you pumped the milk on the container.  This is how long you can store breast milk: ? Room temperature: 6-8 hours. It is best to use the milk within 4 hours. ? Cooler with ice packs: 24 hours. ? Refrigerator: 5-8 days, if the milk is clean. It is best to use the milk within 3 days. ? Freezer: 9-12 months, if the milk is clean and stored away from the freezer door. It is best to use the milk within 6 months.  Put milk in the back of the refrigerator or freezer.  Thaw frozen milk using warm water. Do not use the microwave. Tips for choosing a breast pump When choosing a pump, keep the following things in mind:  Manual breast pumps do not need electricity. They cost less. They can be hard to use.  Electric breast pumps use electricity. They are more expensive. They are easier to use. They collect more  milk.  The suction cup (flange) should be the right size.  Before you buy the pump, check if your insurance will pay for it. Tips for caring for a breast pump  Check the manual that came with your pump for cleaning tips.  Clean the pump after you use it. To do this: ? Wipe down the electrical part. Use a dry cloth or paper towel. Do not put this part in water or in cleaning products. ? Wash the plastic parts with soap and warm water. Or use the dishwasher if the manual says it is safe. You do not need to clean the tubing unless it touched breast milk. ? Let all the parts air dry. Avoid drying them with a cloth or towel. ? When the parts are clean and dry, put the pump back together. Then store the pump.  If there is water in the tubing when you want to pump: 1. Attach the tubing to the pump. 2. Turn on the pump. 3. Turn off the pump when the tube is dry.  Try not to touch the inside of pump parts. Summary  Pumping can help you start making milk after your baby is born. It lets you keep making milk when you are away from your baby.  When you are away from your baby, pump for about 15 minutes every 2-3 hours. Pump both breasts at the same time, if you can. This information is not intended to replace advice given to you by your health care provider. Make sure you discuss any questions you have with your health care provider. Document Revised: 02/07/2019 Document Reviewed: 11/22/2016 Elsevier Patient Education  [phone]  [employer].

## 2020-11-06 NOTE — Telephone Encounter (Signed)
Client with [facility] 11/06/2019. Jossie Ng, RN

## 2020-11-06 NOTE — Progress Notes (Signed)
Pt to be discharged. Discharge instructions, prescriptions, and follow up appointments given to and reviewed with patient. Pt verbalized understanding. To be escorted out by staff when patient is ready to leave.

## 2020-11-07 LAB — SURGICAL PATHOLOGY

## 2020-11-09 NOTE — Discharge Summary (Signed)
Patient's last menstrual period was 01/26/2020 (approximate). EDC: Estimated Date of Delivery: 11/01/20 EGA: [redacted]w[redacted]d  Patient presented for evaluation of labor.  Patient had cervical exam by RN and this was reported to me. I reviewed her vital signs and fetal tracing, both of which were reassuring.  Patient was discharged as she was not laboring.  NST interpretation: Reactive.  Ranae Plumber, MD Attending Obstetrician and Gynecologist [facility]

## 2020-12-15 ENCOUNTER — Emergency Department: Payer: Self-pay

## 2020-12-15 ENCOUNTER — Other Ambulatory Visit: Payer: Self-pay

## 2020-12-15 ENCOUNTER — Emergency Department
Admission: EM | Admit: 2020-12-15 | Discharge: 2020-12-15 | Disposition: A | Discharge status: Home / Self Care | Payer: Self-pay | Attending: Emergency Medicine | Admitting: Emergency Medicine

## 2020-12-15 DIAGNOSIS — K8050 Calculus of bile duct without cholangitis or cholecystitis without obstruction: Secondary | ICD-10-CM

## 2020-12-15 DIAGNOSIS — R52 Pain, unspecified: Secondary | ICD-10-CM

## 2020-12-15 DIAGNOSIS — K8066 Calculus of gallbladder and bile duct with acute and chronic cholecystitis without obstruction: Secondary | ICD-10-CM | POA: Insufficient documentation

## 2020-12-15 DIAGNOSIS — R1084 Generalized abdominal pain: Secondary | ICD-10-CM

## 2020-12-15 DIAGNOSIS — K8020 Calculus of gallbladder without cholecystitis without obstruction: Secondary | ICD-10-CM

## 2020-12-15 LAB — SAMPLE TO BLOOD BANK

## 2020-12-15 LAB — COMPREHENSIVE METABOLIC PANEL
ALT: 27 U/L (ref 0–44)
AST: 32 U/L (ref 15–41)
Albumin: 4.5 g/dL (ref 3.5–5.0)
Alkaline Phosphatase: 110 U/L (ref 38–126)
Anion gap: 9 (ref 5–15)
BUN: 15 mg/dL (ref 6–20)
CO2: 23 mmol/L (ref 22–32)
Calcium: 9.1 mg/dL (ref 8.9–10.3)
Chloride: 103 mmol/L (ref 98–111)
Creatinine, Ser: 0.64 mg/dL (ref 0.44–1.00)
GFR, Estimated: >60 mL/min (ref >60)
Glucose, Bld: 117 mg/dL — ABNORMAL HIGH (ref 70–99)
Potassium: 3.5 mmol/L (ref 3.5–5.1)
Sodium: 135 mmol/L (ref 135–145)
Total Bilirubin: 1.1 mg/dL (ref 0.3–1.2)
Total Protein: 7.8 g/dL (ref 6.5–8.1)

## 2020-12-15 LAB — URINALYSIS, COMPLETE (UACMP) WITH MICROSCOPIC
Bacteria, UA: NONE SEEN
Bilirubin Urine: NEGATIVE
Glucose, UA: NEGATIVE mg/dL
Ketones, ur: NEGATIVE mg/dL
Leukocytes,Ua: NEGATIVE
Nitrite: NEGATIVE
Protein, ur: NEGATIVE mg/dL
Specific Gravity, Urine: 1.008 (ref 1.005–1.030)
WBC, UA: NONE SEEN WBC/hpf (ref 0–5)
pH: 5 (ref 5.0–8.0)

## 2020-12-15 LAB — CBC
HCT: 38.4 % (ref 36.0–46.0)
Hemoglobin: 12.7 g/dL (ref 12.0–15.0)
MCH: 30 pg (ref 26.0–34.0)
MCHC: 33.1 g/dL (ref 30.0–36.0)
MCV: 90.6 fL (ref 80.0–100.0)
Platelets: 295 10*3/uL (ref 150–400)
RBC: 4.24 MIL/uL (ref 3.87–5.11)
RDW: 12.7 % (ref 11.5–15.5)
WBC: 12.3 10*3/uL — ABNORMAL HIGH (ref 4.0–10.5)
nRBC: 0 % (ref 0.0–0.2)

## 2020-12-15 LAB — LIPASE, BLOOD: Lipase: 40 U/L (ref 11–51)

## 2020-12-15 LAB — HCG, QUANTITATIVE, PREGNANCY: hCG, Beta Chain, Quant, S: 1 m[IU]/mL (ref <5)

## 2020-12-15 MED ORDER — ONDANSETRON HCL 4 MG/2ML IJ SOLN
INTRAMUSCULAR | Status: AC
  Administered 2020-12-15: 4 mg via INTRAVENOUS
  Filled 2020-12-15: qty 2

## 2020-12-15 MED ORDER — ONDANSETRON 4 MG PO TBDP: 4.0000 mg | ORAL_TABLET | Freq: Three times a day (TID) | ORAL | 0 refills | Status: DC | PRN

## 2020-12-15 MED ORDER — SODIUM CHLORIDE 0.9 % IV BOLUS
1000.0000 mL | Freq: Once | INTRAVENOUS | Status: AC
  Administered 2020-12-15: 1000 mL via INTRAVENOUS

## 2020-12-15 MED ORDER — MORPHINE SULFATE (PF) 2 MG/ML IV SOLN: 2.0000 mg | Freq: Once | INTRAVENOUS | Status: AC

## 2020-12-15 MED ORDER — MORPHINE SULFATE (PF) 2 MG/ML IV SOLN
INTRAVENOUS | Status: AC
  Administered 2020-12-15: 2 mg via INTRAVENOUS
  Filled 2020-12-15: qty 1

## 2020-12-15 MED ORDER — OXYCODONE-ACETAMINOPHEN 5-325 MG PO TABS: 1.0000 | ORAL_TABLET | ORAL | 0 refills | Status: DC | PRN

## 2020-12-15 MED ORDER — IOHEXOL 300 MG/ML  SOLN
100.0000 mL | Freq: Once | INTRAMUSCULAR | Status: AC | PRN
  Administered 2020-12-15: 100 mL via INTRAVENOUS

## 2020-12-15 MED ORDER — ONDANSETRON HCL 4 MG/2ML IJ SOLN: 4.0000 mg | Freq: Once | INTRAMUSCULAR | Status: AC

## 2020-12-15 MED ORDER — IOHEXOL 9 MG/ML PO SOLN: 1000.0000 mL | Freq: Once | ORAL | Status: DC | PRN

## 2020-12-15 NOTE — ED Triage Notes (Signed)
Pt states several mid abd pain that began approx an hour ago. Pt is four weeks post partum vaginal delivery. Pt states she is also experiencing heavy vaginal bleeding, pt states she is using a pad every 45 minutes. Pt appears uncomfortable.

## 2020-12-15 NOTE — ED Provider Notes (Signed)
[facility] Emergency Department Provider Note   ____________________________________________   Event Date/Time   First MD Initiated Contact with Patient 12/15/20 [id number]     (approximate)  I have reviewed the triage vital signs and the nursing notes.   HISTORY  Chief Complaint Abdominal Pain    HPI Kathy Benton is a 30 y.o. female who presents to the ED from home with a chief complaint of abdominal pain.  Patient was awake, when she felt sudden onset mid abdominal pain approximately 1 hour prior to arrival.  Patient is status post NSVD on 11/05/2020; breast-feeding.  Describes sharp, nonradiating mid abdominal pain not associated with fever, nausea/vomiting/diarrhea or dysuria.  Started her first.  Since delivery 2 days ago and states she is experiencing heavy bleeding with clots.  Denies foul odor.  Denies fever, cough, chest pain, shortness of breath, dizziness. Patient is unvaccinated for COVID-19 and had the virus in January when she delivered.  States she was largely asymptomatic.     Past Medical History:  Diagnosis Date  . Gestational diabetes 2019   previous pregnancy and current pregnancy   . Headache    migraines  . Hypertension    previous pregnancy     Patient Active Problem List   Diagnosis Date Noted  . Precipitous delivery 11/05/2020  . COVID-19 11/05/2020  . Indication for care in labor and delivery, antepartum 11/04/2020  . Noncompliant pregnant patient (no care from 07/25/20-09/03/20) x 6 wks and 3 hr GTT on 09/05/20 at 31 wks 09/03/2020  . Supervision of high risk pregnancy in third trimester 09/03/2020  . 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 Maternal varicella, non-immune 06/30/2020  . Gestational diabetes mellitus (GDM) affecting pregnancy with noncompliance & 3 hr GTT at 31 wks on 09/05/20 06/30/2020  . History of hypertension--CHTN dx'd 2008 06/27/2020  . Late prenatal care (21 wks) 06/27/2020  . Obesity in pregnancy   BMI=35.5 01/24/2018    No past surgical history on file.  Prior to Admission medications   Medication Sig Start Date End Date Taking? Authorizing Provider  ondansetron (ZOFRAN ODT) 4 MG disintegrating tablet Take 1 tablet (4 mg total) by mouth every 8 (eight) hours as needed for nausea or vomiting. 12/15/20  Yes Irean Hong, MD  oxyCODONE-acetaminophen (PERCOCET/ROXICET) 5-325 MG tablet Take 1 tablet by mouth every 4 (four) hours as needed for severe pain. 12/15/20  Yes Irean Hong, MD  acetaminophen (TYLENOL) 325 MG tablet Take 2 tablets (650 mg total) by mouth every 4 (four) hours as needed (for pain scale < 4ORtemperature>/=100.5 F). 11/06/20   Gustavo Lah, CNM  calcium carbonate (TUMS - DOSED IN MG ELEMENTAL CALCIUM) 500 MG chewable tablet Chew 1,000 mg by mouth daily as needed for heartburn or indigestion.    [provider]  ibuprofen (ADVIL) 600 MG tablet Take 1 tablet (600 mg total) by mouth every 6 (six) hours as needed for mild pain or moderate pain. 11/06/20   Gustavo Lah, CNM  Prenatal Vit-Fe Fumarate-FA (PRENATAL PO) Take 1 tablet by mouth daily.    [provider]    Allergies Patient has no known allergies.  No family history on file.  Social History Social History   Tobacco Use  . Smoking status: Never Smoker  . Smokeless tobacco: Never Used  . Tobacco comment: No passive exposure  Vaping Use  . Vaping Use: Never used  Substance Use Topics  . Alcohol use: Not Currently  . Drug  use: No    Review of Systems  Constitutional: No fever/chills Eyes: No visual changes. ENT: No sore throat. Cardiovascular: Denies chest pain. Respiratory: Denies shortness of breath. Gastrointestinal: Positive for abdominal pain.  No nausea, no vomiting.  No diarrhea.  No constipation. Genitourinary: Positive for vaginal bleeding.  Negative for dysuria. Musculoskeletal: Negative for back pain. Skin: Negative for rash. Neurological: Negative for headaches,  focal weakness or numbness.   ____________________________________________   PHYSICAL EXAM:  VITAL SIGNS: ED Triage Vitals  Enc Vitals Group     BP 12/15/20 0210 (!) 135/96     Pulse Rate 12/15/20 0210 72     Resp 12/15/20 0210 (!) 24     Temp 12/15/20 0210 98.8 F (37.1 C)     Temp Source 12/15/20 0210 Oral     SpO2 12/15/20 0210 96 %     Weight 12/15/20 0211 140 lb (63.5 kg)     Height 12/15/20 0211 4\' 11"  (1.499 m)     Head Circumference --      Peak Flow --      Pain Score 12/15/20 0212 10     Pain Loc --      Pain Edu? --      Excl. in GC? --     Constitutional: Alert and oriented. Well appearing and in mild acute distress. Eyes: Conjunctivae are normal. PERRL. EOMI. Head: Atraumatic. Nose: No congestion/rhinnorhea. Mouth/Throat: Mucous membranes are moist.   Neck: No stridor.   Cardiovascular: Normal rate, regular rhythm. Grossly normal heart sounds.  Good peripheral circulation. Respiratory: Normal respiratory effort.  No retractions. Lungs CTAB. Gastrointestinal: Soft and tender to palpation epigastrium and umbilicus without rebound or guarding. No distention. No abdominal bruits. No CVA tenderness. Musculoskeletal: No lower extremity tenderness nor edema.  No joint effusions. Neurologic:  Normal speech and language. No gross focal neurologic deficits are appreciated. No gait instability. Skin:  Skin is warm, dry and intact. No rash noted. Psychiatric: Mood and affect are normal. Speech and behavior are normal.  ____________________________________________   LABS (all labs ordered are listed, but only abnormal results are displayed)  Labs Reviewed  COMPREHENSIVE METABOLIC PANEL - Abnormal; Notable for the following components:      Result Value   Glucose, Bld 117 (*)    All other components within normal limits  CBC - Abnormal; Notable for the following components:   WBC 12.3 (*)    All other components within normal limits  URINALYSIS, COMPLETE (UACMP)  WITH MICROSCOPIC - Abnormal; Notable for the following components:   Color, Urine STRAW (*)    APPearance CLEAR (*)    Hgb urine dipstick MODERATE (*)    All other components within normal limits  LIPASE, BLOOD  HCG, QUANTITATIVE, PREGNANCY  SAMPLE TO BLOOD BANK   ____________________________________________  EKG  None ____________________________________________  RADIOLOGY I, Ellis Koffler J, personally viewed and evaluated these images (plain radiographs) as part of my medical decision making, as well as reviewing the written report by the radiologist.  ED MD interpretation: CT demonstrates gallstones but otherwise unremarkable; recommend ultrasound.  Right upper quadrant abdominal ultrasound demonstrates cholelithiasis without cholecystitis.  Pelvic ultrasound unremarkable.  Official radiology report(s): US PELVIS TRANSVAGINAL NON-OB (TV ONLY)  Result Date: 12/15/2020 CLINICAL DATA:  Left pelvic pain and heavy bleeding X 2 days. First month postpartum. EXAM: TRANSABDOMINAL ULTRASOUND OF PELVIS DOPPLER ULTRASOUND OF OVARIES TECHNIQUE: Transabdominal ultrasound examination of the pelvis was performed including evaluation of the uterus, ovaries, adnexal regions, and pelvic cul-de-sac. Color and  duplex Doppler ultrasound was utilized to evaluate blood flow to the ovaries. COMPARISON:  CT abdomen pelvis 12/15/2020 FINDINGS: Uterus Measurements: 11.2 x 3.7 x 6.2 cm = volume: 133 mL. No fibroids or other mass visualized. Endometrium Thickness: 9 mm. Small volume free fluid within the endometrial canal. No endometrial vascularity. No definite mass or focal abnormality visualized. Right ovary Measurements: 2.1 x 1.5 x 1.9 cm = volume: 3.2 mL. Normal appearance/no adnexal mass. Left ovary Measurements: 2.7 x 1.4 x 1.4 cm = volume: 2.8 mL. Normal appearance/no adnexal mass. Pulsed Doppler evaluation demonstrates normal low-resistance arterial and venous waveforms in both ovaries. Other: No free pelvic  fluid. IMPRESSION: 1. No endometrial vascularity to suggest retained product of conception or mass. 2. No endometrial findings to suggest endometritis. 3. Normal endometrial caliber with small volume free fluid within the endometrial canal consistent with given history of bleeding. Electronically Signed   By: Tish Frederickson M.D.   On: 12/15/2020 06:07   US Pelvis Complete  Result Date: 12/15/2020 CLINICAL DATA:  Left pelvic pain and heavy bleeding X 2 days. First month postpartum. EXAM: TRANSABDOMINAL ULTRASOUND OF PELVIS DOPPLER ULTRASOUND OF OVARIES TECHNIQUE: Transabdominal ultrasound examination of the pelvis was performed including evaluation of the uterus, ovaries, adnexal regions, and pelvic cul-de-sac. Color and duplex Doppler ultrasound was utilized to evaluate blood flow to the ovaries. COMPARISON:  CT abdomen pelvis 12/15/2020 FINDINGS: Uterus Measurements: 11.2 x 3.7 x 6.2 cm = volume: 133 mL. No fibroids or other mass visualized. Endometrium Thickness: 9 mm. Small volume free fluid within the endometrial canal. No endometrial vascularity. No definite mass or focal abnormality visualized. Right ovary Measurements: 2.1 x 1.5 x 1.9 cm = volume: 3.2 mL. Normal appearance/no adnexal mass. Left ovary Measurements: 2.7 x 1.4 x 1.4 cm = volume: 2.8 mL. Normal appearance/no adnexal mass. Pulsed Doppler evaluation demonstrates normal low-resistance arterial and venous waveforms in both ovaries. Other: No free pelvic fluid. IMPRESSION: 1. No endometrial vascularity to suggest retained product of conception or mass. 2. No endometrial findings to suggest endometritis. 3. Normal endometrial caliber with small volume free fluid within the endometrial canal consistent with given history of bleeding. Electronically Signed   By: Tish Frederickson M.D.   On: 12/15/2020 06:07   CT Abdomen Pelvis W Contrast  Result Date: 12/15/2020 CLINICAL DATA:  Nonlocalized acute abdominal pain. Postpartum vaginal delivery. Using  pad every 45 minutes. EXAM: CT ABDOMEN AND PELVIS WITH CONTRAST TECHNIQUE: Multidetector CT imaging of the abdomen and pelvis was performed using the standard protocol following bolus administration of intravenous contrast. CONTRAST:  OMNIPAQUE IOHEXOL 300 MG/ML  SOLN COMPARISON:  None. FINDINGS: Lower chest: No acute abnormality. Hepatobiliary: No focal liver abnormality. Calcified gallstone within the gallbladder lumen. No gallbladder wall thickening or pericholecystic fluid. No biliary dilatation. Pancreas: No focal lesion. Normal pancreatic contour. No surrounding inflammatory changes. No main pancreatic ductal dilatation. Spleen: Normal in size without focal abnormality. Adrenals/Urinary Tract: No adrenal nodule bilaterally. Bilateral kidneys enhance symmetrically. No hydronephrosis. No hydroureter. The urinary bladder is unremarkable. Stomach/Bowel: Stomach is within normal limits. No evidence of bowel wall thickening or dilatation. Appendix appears normal. Vascular/Lymphatic: No significant vascular findings are present. No enlarged abdominal or pelvic lymph nodes. Reproductive: Uterus and bilateral adnexa are unremarkable. Other: No intraperitoneal free fluid. No intraperitoneal free gas. No organized fluid collection. Musculoskeletal: No suspicious lytic or blastic osseous findings. No acute displaced fracture. 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 IMPRESSION: 1. No acute intra-abdominal or intrapelvic abnormality.  Given clinical history, recommend pelvic ultrasound for a more sensitive evaluation in the postpartum setting. 2. Cholelithiasis with no acute cholecystitis. Electronically Signed   By: Tish Frederickson M.D.   On: 12/15/2020 04:17   US PELVIC DOPPLER (TORSION R/O OR MASS ARTERIAL FLOW)  Result Date: 12/15/2020 CLINICAL DATA:  Left pelvic pain and heavy bleeding X 2 days. First month postpartum. EXAM: TRANSABDOMINAL ULTRASOUND OF PELVIS DOPPLER  ULTRASOUND OF OVARIES TECHNIQUE: Transabdominal ultrasound examination of the pelvis was performed including evaluation of the uterus, ovaries, adnexal regions, and pelvic cul-de-sac. Color and duplex Doppler ultrasound was utilized to evaluate blood flow to the ovaries. COMPARISON:  CT abdomen pelvis 12/15/2020 FINDINGS: Uterus Measurements: 11.2 x 3.7 x 6.2 cm = volume: 133 mL. No fibroids or other mass visualized. Endometrium Thickness: 9 mm. Small volume free fluid within the endometrial canal. No endometrial vascularity. No definite mass or focal abnormality visualized. Right ovary Measurements: 2.1 x 1.5 x 1.9 cm = volume: 3.2 mL. Normal appearance/no adnexal mass. Left ovary Measurements: 2.7 x 1.4 x 1.4 cm = volume: 2.8 mL. Normal appearance/no adnexal mass. Pulsed Doppler evaluation demonstrates normal low-resistance arterial and venous waveforms in both ovaries. Other: No free pelvic fluid. IMPRESSION: 1. No endometrial vascularity to suggest retained product of conception or mass. 2. No endometrial findings to suggest endometritis. 3. Normal endometrial caliber with small volume free fluid within the endometrial canal consistent with given history of bleeding. Electronically Signed   By: Tish Frederickson M.D.   On: 12/15/2020 06:07   US ABDOMEN LIMITED RUQ (LIVER/GB)  Result Date: 12/15/2020 CLINICAL DATA:  Upper abdominal pain starting today EXAM: ULTRASOUND ABDOMEN LIMITED RIGHT UPPER QUADRANT COMPARISON:  Abdominal CT from earlier the same day. FINDINGS: Gallbladder: Layering gallstones as seen by prior CT, measuring up to 9 mm. 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 No gallbladder wall thickening or focal tenderness Common bile duct: Diameter: 5 mm.  Where visualized, no filling defect. Liver: No focal lesion identified. Within normal limits in parenchymal echogenicity. Portal vein is patent on color Doppler imaging with normal direction of blood  flow towards the liver. IMPRESSION: Cholelithiasis without cholecystitis. Electronically Signed   By: Marnee Spring M.D.   On: 12/15/2020 05:52    ____________________________________________   PROCEDURES  Procedure(s) performed (including Critical Care):  Procedures   ____________________________________________   INITIAL IMPRESSION / ASSESSMENT AND PLAN / ED COURSE  As part of my medical decision making, I reviewed the following data within the electronic MEDICAL RECORD NUMBER Nursing notes reviewed and incorporated, Labs reviewed, Old chart reviewed, Radiograph reviewed, Notes from prior ED visits and  Controlled Substance Database     30 year old female presenting with mid abdominal pain. Differential diagnosis includes, but is not limited to, ovarian cyst, ovarian torsion, acute appendicitis, diverticulitis, urinary tract infection/pyelonephritis, endometriosis, bowel obstruction, colitis, renal colic, gastroenteritis, hernia, fibroids, endometriosis, pregnancy related pain including ectopic pregnancy, etc.  Will obtain basic lab work, UA.  Proceed with CT abdomen/pelvis to evaluate etiology of patient's symptoms.  Initiate IV fluid hydration, IV morphine for pain paired with IV Zofran as needed for nausea.  Clinical Course as of 12/15/20 1610  [facility] Dec 15, 2020  9604 Updated patient on CT scan results.  She is feeling significantly better.  Will proceed with ultrasound to right upper quadrant and pelvis. [JS]  [id number] Updated patient on ultrasound results.  Will discharge home with prescriptions for Percocet and Zofran to use as needed.  Patient  will follow up with general surgery outpatient.  Strict return precautions given.  Patient verbalizes understanding and agrees with plan of care. [JS]    Clinical Course User Index [JS] Irean Hong, MD     ____________________________________________   FINAL CLINICAL IMPRESSION(S) / ED DIAGNOSES  Final diagnoses:  Generalized  abdominal pain  Pain  Calculus of gallbladder without cholecystitis without obstruction  Biliary colic     ED Discharge Orders         Ordered    oxyCODONE-acetaminophen (PERCOCET/ROXICET) 5-325 MG tablet  Every 4 hours PRN        12/15/20 0609    ondansetron (ZOFRAN ODT) 4 MG disintegrating tablet  Every 8 hours PRN        12/15/20 0609          *Please note:  Kathy Benton was evaluated in Emergency Department on 12/15/2020 for the symptoms described in the history of present illness. She was evaluated in the context of the global COVID-19 pandemic, which necessitated consideration that the patient might be at risk for infection with the SARS-CoV-2 virus that causes COVID-19. Institutional protocols and algorithms that pertain to the evaluation of patients at risk for COVID-19 are in a state of rapid change based on information released by regulatory bodies including the CDC and federal and state organizations. These policies and algorithms were followed during the patient's care in the ED.  Some ED evaluations and interventions may be delayed as a result of limited staffing during and the pandemic.*   Note:  This document was prepared using Dragon voice recognition software and may include unintentional dictation errors.   Irean Hong, MD 12/15/20 [phone]

## 2020-12-15 NOTE — ED Notes (Signed)
Pt reports bleeding began 2 days ago, has been heavy since then. Pt reports passing clots that are about dime sized to a little larger. Pt with central abdominal tenderness, minimal tenderness in the lower abd  Pt breast feeds/pumps. Last pump about 2000 tonight

## 2020-12-15 NOTE — ED Notes (Signed)
Pt in room using breast pump. Pump to return after use.

## 2020-12-15 NOTE — Discharge Instructions (Signed)
1. Take medicines as needed for pain & nausea (Percocet/Zofran #20).  Pump and dump the first ounce of your breastmilk if you are taking Percocet. 2. Clear liquids x 12 hours, then bland diet x 1 week, then slowly advance diet as tolerated. Avoid fatty, greasy, spicy foods and drinks. 3. Return to the ER for worsening symptoms, persistent vomiting, fever, difficulty breathing or other concerns.

## 2020-12-15 NOTE — ED Notes (Addendum)
Pt to [place].   Breast pump brought down for pt use.

## 2020-12-16 ENCOUNTER — Telehealth: Payer: Self-pay

## 2020-12-16 NOTE — Telephone Encounter (Signed)
TC to patient to reschedule PP appointment due to building maintenance. Patient rescheduled to Thursday, 12/18/2020 appointment and plans to use Depo for [facility].Burt Knack, RN

## 2020-12-17 ENCOUNTER — Inpatient Hospital Stay
Admission: EM | Admit: 2020-12-17 | Discharge: 2020-12-20 | DRG: 417 | Disposition: A | Discharge status: Home / Self Care | Payer: Medicaid Other | Attending: Internal Medicine | Admitting: Internal Medicine

## 2020-12-17 ENCOUNTER — Other Ambulatory Visit: Payer: Self-pay

## 2020-12-17 ENCOUNTER — Ambulatory Visit: Payer: Self-pay

## 2020-12-17 ENCOUNTER — Emergency Department: Payer: Medicaid Other

## 2020-12-17 ENCOUNTER — Encounter: Payer: Self-pay | Admitting: *Deleted

## 2020-12-17 DIAGNOSIS — K8050 Calculus of bile duct without cholangitis or cholecystitis without obstruction: Secondary | ICD-10-CM

## 2020-12-17 DIAGNOSIS — K819 Cholecystitis, unspecified: Secondary | ICD-10-CM

## 2020-12-17 DIAGNOSIS — K851 Biliary acute pancreatitis without necrosis or infection: Secondary | ICD-10-CM | POA: Diagnosis present

## 2020-12-17 DIAGNOSIS — O24419 Gestational diabetes mellitus in pregnancy, unspecified control: Secondary | ICD-10-CM | POA: Diagnosis present

## 2020-12-17 DIAGNOSIS — E876 Hypokalemia: Secondary | ICD-10-CM | POA: Diagnosis present

## 2020-12-17 DIAGNOSIS — R7989 Other specified abnormal findings of blood chemistry: Secondary | ICD-10-CM

## 2020-12-17 DIAGNOSIS — O99210 Obesity complicating pregnancy, unspecified trimester: Secondary | ICD-10-CM | POA: Diagnosis present

## 2020-12-17 DIAGNOSIS — I10 Essential (primary) hypertension: Secondary | ICD-10-CM | POA: Diagnosis present

## 2020-12-17 DIAGNOSIS — Z8679 Personal history of other diseases of the circulatory system: Secondary | ICD-10-CM

## 2020-12-17 DIAGNOSIS — Z20822 Contact with and (suspected) exposure to covid-19: Secondary | ICD-10-CM | POA: Diagnosis present

## 2020-12-17 DIAGNOSIS — G43909 Migraine, unspecified, not intractable, without status migrainosus: Secondary | ICD-10-CM | POA: Diagnosis present

## 2020-12-17 DIAGNOSIS — R1013 Epigastric pain: Secondary | ICD-10-CM

## 2020-12-17 DIAGNOSIS — Z79899 Other long term (current) drug therapy: Secondary | ICD-10-CM

## 2020-12-17 DIAGNOSIS — R109 Unspecified abdominal pain: Secondary | ICD-10-CM

## 2020-12-17 DIAGNOSIS — Z8616 Personal history of COVID-19: Secondary | ICD-10-CM

## 2020-12-17 DIAGNOSIS — K810 Acute cholecystitis: Secondary | ICD-10-CM | POA: Diagnosis present

## 2020-12-17 DIAGNOSIS — K8000 Calculus of gallbladder with acute cholecystitis without obstruction: Principal | ICD-10-CM | POA: Diagnosis present

## 2020-12-17 LAB — CBC
HCT: 36.5 % (ref 36.0–46.0)
Hemoglobin: 12 g/dL (ref 12.0–15.0)
MCH: 29.5 pg (ref 26.0–34.0)
MCHC: 32.9 g/dL (ref 30.0–36.0)
MCV: 89.7 fL (ref 80.0–100.0)
Platelets: 262 10*3/uL (ref 150–400)
RBC: 4.07 MIL/uL (ref 3.87–5.11)
RDW: 12.4 % (ref 11.5–15.5)
WBC: 5.9 10*3/uL (ref 4.0–10.5)
nRBC: 0 % (ref 0.0–0.2)

## 2020-12-17 LAB — COMPREHENSIVE METABOLIC PANEL
ALT: 196 U/L — ABNORMAL HIGH (ref 0–44)
AST: 130 U/L — ABNORMAL HIGH (ref 15–41)
Albumin: 3.9 g/dL (ref 3.5–5.0)
Alkaline Phosphatase: 219 U/L — ABNORMAL HIGH (ref 38–126)
Anion gap: 8 (ref 5–15)
BUN: 6 mg/dL (ref 6–20)
CO2: 23 mmol/L (ref 22–32)
Calcium: 8.8 mg/dL — ABNORMAL LOW (ref 8.9–10.3)
Chloride: 105 mmol/L (ref 98–111)
Creatinine, Ser: 0.53 mg/dL (ref 0.44–1.00)
GFR, Estimated: >60 mL/min (ref >60)
Glucose, Bld: 115 mg/dL — ABNORMAL HIGH (ref 70–99)
Potassium: 3.4 mmol/L — ABNORMAL LOW (ref 3.5–5.1)
Sodium: 136 mmol/L (ref 135–145)
Total Bilirubin: 3.8 mg/dL — ABNORMAL HIGH (ref 0.3–1.2)
Total Protein: 7.5 g/dL (ref 6.5–8.1)

## 2020-12-17 LAB — URINALYSIS, COMPLETE (UACMP) WITH MICROSCOPIC
Bacteria, UA: NONE SEEN
Bilirubin Urine: NEGATIVE
Glucose, UA: NEGATIVE mg/dL
Hgb urine dipstick: NEGATIVE
Ketones, ur: NEGATIVE mg/dL
Leukocytes,Ua: NEGATIVE
Nitrite: NEGATIVE
Protein, ur: NEGATIVE mg/dL
Specific Gravity, Urine: 1.006 (ref 1.005–1.030)
pH: 6 (ref 5.0–8.0)

## 2020-12-17 LAB — LIPASE, BLOOD: Lipase: 400 U/L — ABNORMAL HIGH (ref 11–51)

## 2020-12-17 MED ORDER — SODIUM CHLORIDE 0.9 % IV BOLUS
1000.0000 mL | Freq: Once | INTRAVENOUS | Status: AC
  Administered 2020-12-17: 1000 mL via INTRAVENOUS

## 2020-12-17 MED ORDER — ONDANSETRON HCL 4 MG/2ML IJ SOLN
4.0000 mg | Freq: Once | INTRAMUSCULAR | Status: AC
  Administered 2020-12-17: 4 mg via INTRAVENOUS
  Filled 2020-12-17: qty 2

## 2020-12-17 MED ORDER — MORPHINE SULFATE (PF) 4 MG/ML IV SOLN
4.0000 mg | Freq: Once | INTRAVENOUS | Status: AC
  Administered 2020-12-17: 4 mg via INTRAVENOUS
  Filled 2020-12-17: qty 1

## 2020-12-17 MED ORDER — GADOBUTROL 1 MMOL/ML IV SOLN
6.0000 mL | Freq: Once | INTRAVENOUS | Status: AC | PRN
  Administered 2020-12-17: 6 mL via INTRAVENOUS

## 2020-12-17 NOTE — ED Notes (Signed)
Pt states that last month she delivered a baby and on this past Monday she was diagnosed with gallstones in the ER and sent home with pain medication. Pt states pain medication pills at home are not working so she came back in . Pt states "I would rather deliver another baby, than have this pain."

## 2020-12-17 NOTE — ED Triage Notes (Signed)
Pt has upper abd pain.  Pt was in er 12/15/20 and dx with gallstones.  Pt had vaginal delivery 1 month ago.   Pt has nausea.  Pt alert  Speech clear.

## 2020-12-17 NOTE — ED Provider Notes (Signed)
11:50 PM  Assumed care from Dr. Derrill Kay.  Patient here with recent diagnosis of cholelithiasis 2/14 with worsening pain, nausea, chills.  Patient found to have elevated LFTs as well as lipase.  MRCP pending for further evaluation to determine if patient has active choledocholithiasis versus just gallstone pancreatitis.  She will need admission.  If patient has choledocholithiasis, she will need to be transferred for ERCP as Dr. Servando Snare with GI is not on call at this time.  12:19 AM  Pt's MRCP shows gallstones with wall thickening and mild enhancement concerning for acute cholecystitis.  The common bile duct is dilated at 8 mm but there are no definite stones seen.  I suspect she may have passed a stone given she appears much more comfortable.  Will discuss with general surgery but I suspect patient will need medicine admission and have time to cool off prior to surgical intervention.  On reassessment she is comfortable at this time.  Declines further pain medication.  12:24 AM  Spoke with Dr. Claudine Mouton with general surgery.  Appreciate his help.  He agrees with plan for medicine admission to give time for pancreatitis to improve and surgery will see patient in consultation today.  12:35 AM Discussed patient's case with hospitalist, Dr. Crissie Reese.  I have recommended admission and patient (and family if present) agree with this plan. Admitting physician will place admission orders.   I reviewed all nursing notes, vitals, pertinent previous records and reviewed/interpreted all EKGs, lab and urine results, imaging (as available).     Chidinma Clites, Layla Maw, DO 12/18/20 [phone]

## 2020-12-17 NOTE — ED Provider Notes (Incomplete)
11:50 PM  Assumed care from Dr. Derrill Kay.  Patient here with recent diagnosis of cholelithiasis with worsening pain.  Patient found to have elevated LFTs as well as lipase.  MRCP pending for further evaluation.  She will need admission.

## 2020-12-17 NOTE — ED Notes (Signed)
Pt placed on bp and pulse ox monitor

## 2020-12-17 NOTE — ED Provider Notes (Signed)
[facility] Emergency Department Provider Note   ____________________________________________   I have reviewed the triage vital signs and the nursing notes.   HISTORY  Chief Complaint Abdominal Pain   History limited by: Not Limited   HPI Kathy Benton is a 30 y.o. female who presents to the emergency department today because of concerns for abdominal pain.  Patient was recently seen in the emergency department and diagnosed with gallstones.  She states that since going home from the emergency department her pain has returned.  She states initially she was able to manage the pain with the pain medication prescribed however she states today the pain medication was no longer helping.  The pain is located in the epigastric region.  She has had some nausea.  When she has tried to eat over the past couple days but did make the pain worse.  Records reviewed. Per medical record review patient has a history of recent ER visit with diagnosis of gallstones.   Past Medical History:  Diagnosis Date  . Gestational diabetes 2019   previous pregnancy and current pregnancy   . Headache    migraines  . Hypertension    previous pregnancy     Patient Active Problem List   Diagnosis Date Noted  . Precipitous delivery 11/05/2020  . COVID-19 11/05/2020  . Indication for care in labor and delivery, antepartum 11/04/2020  . Noncompliant pregnant patient (no care from 07/25/20-09/03/20) x 6 wks and 3 hr GTT on 09/05/20 at 31 wks 09/03/2020  . Supervision of high risk pregnancy in third trimester 09/03/2020  . 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 Maternal varicella, non-immune 06/30/2020  . Gestational diabetes mellitus (GDM) affecting pregnancy with noncompliance & 3 hr GTT at 31 wks on 09/05/20 06/30/2020  . History of hypertension--CHTN dx'd 2008 06/27/2020  . Late prenatal care (21 wks) 06/27/2020  . Obesity in pregnancy  BMI=35.5 01/24/2018    No past surgical  history on file.  Prior to Admission medications   Medication Sig Start Date End Date Taking? Authorizing Provider  acetaminophen (TYLENOL) 325 MG tablet Take 2 tablets (650 mg total) by mouth every 4 (four) hours as needed (for pain scale < 4ORtemperature>/=100.5 F). 11/06/20   Minda Meo, CNM  calcium carbonate (TUMS - DOSED IN MG ELEMENTAL CALCIUM) 500 MG chewable tablet Chew 1,000 mg by mouth daily as needed for heartburn or indigestion.    [provider]  ibuprofen (ADVIL) 600 MG tablet Take 1 tablet (600 mg total) by mouth every 6 (six) hours as needed for mild pain or moderate pain. 11/06/20   Minda Meo, CNM  ondansetron (ZOFRAN ODT) 4 MG disintegrating tablet Take 1 tablet (4 mg total) by mouth every 8 (eight) hours as needed for nausea or vomiting. 12/15/20   Paulette Blanch, MD  oxyCODONE-acetaminophen (PERCOCET/ROXICET) 5-325 MG tablet Take 1 tablet by mouth every 4 (four) hours as needed for severe pain. 12/15/20   Paulette Blanch, MD  Prenatal Vit-Fe Fumarate-FA (PRENATAL PO) Take 1 tablet by mouth daily.    [provider]    Allergies Patient has no known allergies.  No family history on file.  Social History Social History   Tobacco Use  . Smoking status: Never Smoker  . Smokeless tobacco: Never Used  . Tobacco comment: No passive exposure  Vaping Use  . Vaping Use: Never used  Substance Use Topics  . Alcohol use: Not Currently  . Drug use: No  Review of Systems Constitutional: No fever/chills Eyes: No visual changes. ENT: No sore throat. Cardiovascular: Denies chest pain. Respiratory: Denies shortness of breath. Gastrointestinal: Positive for abdominal pain and nausea.  Genitourinary: Negative for dysuria. Musculoskeletal: Negative for back pain. Skin: Negative for rash. Neurological: Negative for headaches, focal weakness or numbness.  ____________________________________________   PHYSICAL EXAM:  VITAL SIGNS: ED Triage  Vitals  Enc Vitals Group     BP 12/17/20 2109 136/89     Pulse Rate 12/17/20 2109 77     Resp 12/17/20 2109 20     Temp 12/17/20 2109 98.6 F (37 C)     Temp Source 12/17/20 2109 Oral     SpO2 12/17/20 2109 98 %     Weight 12/17/20 2105 140 lb (63.5 kg)     Height 12/17/20 2105 $RemoveBefor'4\' 11"'[id number]$  (1.499 m)     Head Circumference --      Peak Flow --      Pain Score 12/17/20 2105 8   Constitutional: Alert and oriented.  Eyes: Conjunctivae are normal.  ENT      Head: Normocephalic and atraumatic.      Nose: No congestion/rhinnorhea.      Mouth/Throat: Mucous membranes are moist.      Neck: No stridor. Hematological/Lymphatic/Immunilogical: No cervical lymphadenopathy. Cardiovascular: Normal rate, regular rhythm.  No murmurs, rubs, or gallops.  Respiratory: Normal respiratory effort without tachypnea nor retractions. Breath sounds are clear and equal bilaterally. No wheezes/rales/rhonchi. Gastrointestinal: Tender to palpation in the epigastric region Genitourinary: Deferred Musculoskeletal: Normal range of motion in all extremities. No lower extremity edema. Neurologic:  Normal speech and language. No gross focal neurologic deficits are appreciated.  Skin:  Skin is warm, dry and intact. No rash noted. Psychiatric: Mood and affect are normal. Speech and behavior are normal. Patient exhibits appropriate insight and judgment.  ____________________________________________    LABS (pertinent positives/negatives)  CBC wbc 5.9, hgb 12.0, plt 262 UA clear, 0-5 wbc, negative leukocytes Lipase 400 CMP na 136, k 3.4, glu 115, cr 0.53, ast 130, alt 196, alk phos 219, t bili 3.8 ____________________________________________   EKG  None  ____________________________________________    RADIOLOGY  MRCP pending  ____________________________________________   PROCEDURES  Procedures  ____________________________________________   INITIAL IMPRESSION / ASSESSMENT AND PLAN / ED  COURSE  Pertinent labs & imaging results that were available during my care of the patient were reviewed by me and considered in my medical decision making (see chart for details).   Patient presents to the emergency department today because of concerns for abdominal pain.  Patient was recently seen and diagnosed with gallstones.  Patient's blood work today is concerning for elevated lipase and hepatic function panel.  Because of this I will order an MRCP to evaluate for potential obstructing stone.  Do think it would be important to have this visualize given if patient has need for ERCP she would have to be transferred to another facility.  Patient did feel better after IV narcotics.  ____________________________________________   FINAL CLINICAL IMPRESSION(S) / ED DIAGNOSES  Final diagnoses:  Epigastric abdominal pain  Gallstone pancreatitis     Note: This dictation was prepared with Dragon dictation. Any transcriptional errors that result from this process are unintentional     Nance Pear, MD 12/17/20 [phone]

## 2020-12-18 ENCOUNTER — Encounter: Payer: Self-pay | Admitting: Family Medicine

## 2020-12-18 ENCOUNTER — Encounter
Admission: EM | Disposition: A | Discharge status: Home / Self Care | Payer: Self-pay | Source: Home / Self Care | Attending: Internal Medicine

## 2020-12-18 ENCOUNTER — Inpatient Hospital Stay: Payer: Medicaid Other

## 2020-12-18 ENCOUNTER — Other Ambulatory Visit: Payer: Self-pay

## 2020-12-18 ENCOUNTER — Inpatient Hospital Stay: Payer: Medicaid Other | Admitting: Certified Registered"

## 2020-12-18 ENCOUNTER — Ambulatory Visit: Payer: Self-pay

## 2020-12-18 DIAGNOSIS — K810 Acute cholecystitis: Secondary | ICD-10-CM | POA: Diagnosis present

## 2020-12-18 DIAGNOSIS — R7989 Other specified abnormal findings of blood chemistry: Secondary | ICD-10-CM

## 2020-12-18 DIAGNOSIS — R748 Abnormal levels of other serum enzymes: Secondary | ICD-10-CM

## 2020-12-18 DIAGNOSIS — Z79899 Other long term (current) drug therapy: Secondary | ICD-10-CM | POA: Diagnosis not present

## 2020-12-18 DIAGNOSIS — Z8616 Personal history of COVID-19: Secondary | ICD-10-CM | POA: Diagnosis not present

## 2020-12-18 DIAGNOSIS — K851 Biliary acute pancreatitis without necrosis or infection: Secondary | ICD-10-CM | POA: Diagnosis present

## 2020-12-18 DIAGNOSIS — I10 Essential (primary) hypertension: Secondary | ICD-10-CM | POA: Diagnosis present

## 2020-12-18 DIAGNOSIS — E876 Hypokalemia: Secondary | ICD-10-CM | POA: Diagnosis present

## 2020-12-18 DIAGNOSIS — R109 Unspecified abdominal pain: Secondary | ICD-10-CM | POA: Diagnosis present

## 2020-12-18 DIAGNOSIS — K8000 Calculus of gallbladder with acute cholecystitis without obstruction: Secondary | ICD-10-CM | POA: Diagnosis present

## 2020-12-18 DIAGNOSIS — K8080 Other cholelithiasis without obstruction: Secondary | ICD-10-CM

## 2020-12-18 DIAGNOSIS — G43909 Migraine, unspecified, not intractable, without status migrainosus: Secondary | ICD-10-CM | POA: Diagnosis present

## 2020-12-18 DIAGNOSIS — Z8632 Personal history of gestational diabetes: Secondary | ICD-10-CM

## 2020-12-18 DIAGNOSIS — E669 Obesity, unspecified: Secondary | ICD-10-CM

## 2020-12-18 DIAGNOSIS — K8050 Calculus of bile duct without cholangitis or cholecystitis without obstruction: Secondary | ICD-10-CM

## 2020-12-18 DIAGNOSIS — Z20822 Contact with and (suspected) exposure to covid-19: Secondary | ICD-10-CM | POA: Diagnosis present

## 2020-12-18 HISTORY — PX: ERCP: SHX5425

## 2020-12-18 LAB — CBC
HCT: 34.6 % — ABNORMAL LOW (ref 36.0–46.0)
Hemoglobin: 11.6 g/dL — ABNORMAL LOW (ref 12.0–15.0)
MCH: 30.1 pg (ref 26.0–34.0)
MCHC: 33.5 g/dL (ref 30.0–36.0)
MCV: 89.6 fL (ref 80.0–100.0)
Platelets: 242 10*3/uL (ref 150–400)
RBC: 3.86 MIL/uL — ABNORMAL LOW (ref 3.87–5.11)
RDW: 12.6 % (ref 11.5–15.5)
WBC: 5.1 10*3/uL (ref 4.0–10.5)
nRBC: 0 % (ref 0.0–0.2)

## 2020-12-18 LAB — TYPE AND SCREEN
ABO/RH(D): O POS
Antibody Screen: NEGATIVE

## 2020-12-18 LAB — COMPREHENSIVE METABOLIC PANEL
ALT: 165 U/L — ABNORMAL HIGH (ref 0–44)
AST: 92 U/L — ABNORMAL HIGH (ref 15–41)
Albumin: 3.4 g/dL — ABNORMAL LOW (ref 3.5–5.0)
Alkaline Phosphatase: 186 U/L — ABNORMAL HIGH (ref 38–126)
Anion gap: 7 (ref 5–15)
BUN: <5 mg/dL — ABNORMAL LOW (ref 6–20)
CO2: 24 mmol/L (ref 22–32)
Calcium: 8.4 mg/dL — ABNORMAL LOW (ref 8.9–10.3)
Chloride: 106 mmol/L (ref 98–111)
Creatinine, Ser: 0.42 mg/dL — ABNORMAL LOW (ref 0.44–1.00)
GFR, Estimated: >60 mL/min (ref >60)
Glucose, Bld: 99 mg/dL (ref 70–99)
Potassium: 3.2 mmol/L — ABNORMAL LOW (ref 3.5–5.1)
Sodium: 137 mmol/L (ref 135–145)
Total Bilirubin: 3.5 mg/dL — ABNORMAL HIGH (ref 0.3–1.2)
Total Protein: 6.5 g/dL (ref 6.5–8.1)

## 2020-12-18 LAB — RESP PANEL BY RT-PCR (FLU A&B, COVID) ARPGX2
Influenza A by PCR: NEGATIVE
Influenza B by PCR: NEGATIVE
SARS Coronavirus 2 by RT PCR: NEGATIVE

## 2020-12-18 LAB — APTT: aPTT: 30 seconds (ref 24–36)

## 2020-12-18 LAB — PROTIME-INR
INR: 1.1 (ref 0.8–1.2)
Prothrombin Time: 14.2 seconds (ref 11.4–15.2)

## 2020-12-18 LAB — LIPASE, BLOOD: Lipase: 98 U/L — ABNORMAL HIGH (ref 11–51)

## 2020-12-18 LAB — POCT PREGNANCY, URINE: Preg Test, Ur: NEGATIVE

## 2020-12-18 SURGERY — ERCP, WITH INTERVENTION IF INDICATED
Anesthesia: General

## 2020-12-18 MED ORDER — ONDANSETRON HCL 4 MG/2ML IJ SOLN
INTRAMUSCULAR | Status: DC | PRN
  Administered 2020-12-18 (×2): 4 mg via INTRAVENOUS

## 2020-12-18 MED ORDER — INDOMETHACIN 50 MG RE SUPP
RECTAL | Status: AC
  Filled 2020-12-18: qty 2

## 2020-12-18 MED ORDER — IBUPROFEN 400 MG PO TABS: 400.0000 mg | ORAL_TABLET | Freq: Four times a day (QID) | ORAL | Status: DC | PRN

## 2020-12-18 MED ORDER — ONDANSETRON HCL 4 MG/2ML IJ SOLN
4.0000 mg | Freq: Four times a day (QID) | INTRAMUSCULAR | Status: DC | PRN
  Administered 2020-12-18 (×2): 4 mg via INTRAVENOUS
  Filled 2020-12-18 (×2): qty 2

## 2020-12-18 MED ORDER — MIDAZOLAM HCL 2 MG/2ML IJ SOLN
INTRAMUSCULAR | Status: AC
  Filled 2020-12-18: qty 2

## 2020-12-18 MED ORDER — LIDOCAINE HCL (CARDIAC) PF 100 MG/5ML IV SOSY
PREFILLED_SYRINGE | INTRAVENOUS | Status: DC | PRN
  Administered 2020-12-18: 80 mg via INTRAVENOUS

## 2020-12-18 MED ORDER — FENTANYL CITRATE (PF) 100 MCG/2ML IJ SOLN: 25.0000 ug | INTRAMUSCULAR | Status: DC | PRN

## 2020-12-18 MED ORDER — DEXAMETHASONE SODIUM PHOSPHATE 10 MG/ML IJ SOLN
INTRAMUSCULAR | Status: DC | PRN
  Administered 2020-12-18: 10 mg via INTRAVENOUS

## 2020-12-18 MED ORDER — PIPERACILLIN-TAZOBACTAM 3.375 G IVPB: 3.3750 g | Freq: Three times a day (TID) | INTRAVENOUS | Status: DC

## 2020-12-18 MED ORDER — LIDOCAINE HCL (PF) 2 % IJ SOLN
INTRAMUSCULAR | Status: AC
  Filled 2020-12-18: qty 5

## 2020-12-18 MED ORDER — ONDANSETRON HCL 4 MG PO TABS: 4.0000 mg | ORAL_TABLET | Freq: Four times a day (QID) | ORAL | Status: DC | PRN

## 2020-12-18 MED ORDER — SODIUM CHLORIDE 0.9 % IV SOLN
INTRAVENOUS | Status: DC | PRN
  Administered 2020-12-18: 10 mL

## 2020-12-18 MED ORDER — ONDANSETRON HCL 4 MG/2ML IJ SOLN
INTRAMUSCULAR | Status: AC
  Filled 2020-12-18: qty 4

## 2020-12-18 MED ORDER — MORPHINE SULFATE (PF) 4 MG/ML IV SOLN
4.0000 mg | Freq: Once | INTRAVENOUS | Status: AC
  Administered 2020-12-18: 4 mg via INTRAVENOUS
  Filled 2020-12-18: qty 1

## 2020-12-18 MED ORDER — HYDROCODONE-ACETAMINOPHEN 5-325 MG PO TABS: 1.0000 | ORAL_TABLET | ORAL | Status: DC | PRN

## 2020-12-18 MED ORDER — PROPOFOL 10 MG/ML IV BOLUS
INTRAVENOUS | Status: DC | PRN
  Administered 2020-12-18: 130 mg via INTRAVENOUS

## 2020-12-18 MED ORDER — INDOMETHACIN 50 MG RE SUPP
100.0000 mg | Freq: Once | RECTAL | Status: DC
  Filled 2020-12-18: qty 2

## 2020-12-18 MED ORDER — ESMOLOL HCL 100 MG/10ML IV SOLN
INTRAVENOUS | Status: AC
  Filled 2020-12-18: qty 10

## 2020-12-18 MED ORDER — LACTATED RINGERS IV SOLN: INTRAVENOUS | Status: DC

## 2020-12-18 MED ORDER — ESMOLOL HCL 100 MG/10ML IV SOLN
INTRAVENOUS | Status: DC | PRN
  Administered 2020-12-18: 10 mg via INTRAVENOUS

## 2020-12-18 MED ORDER — MIDAZOLAM HCL 2 MG/2ML IJ SOLN
INTRAMUSCULAR | Status: DC | PRN
  Administered 2020-12-18: 2 mg via INTRAVENOUS

## 2020-12-18 MED ORDER — POLYETHYLENE GLYCOL 3350 17 G PO PACK: 17.0000 g | PACK | Freq: Every day | ORAL | Status: DC | PRN

## 2020-12-18 MED ORDER — FENTANYL CITRATE (PF) 100 MCG/2ML IJ SOLN
INTRAMUSCULAR | Status: AC
  Filled 2020-12-18: qty 2

## 2020-12-18 MED ORDER — GLYCOPYRROLATE 0.2 MG/ML IJ SOLN
INTRAMUSCULAR | Status: DC | PRN
  Administered 2020-12-18: .2 mg via INTRAVENOUS

## 2020-12-18 MED ORDER — PIPERACILLIN-TAZOBACTAM 3.375 G IVPB 30 MIN
3.3750 g | Freq: Once | INTRAVENOUS | Status: AC
  Administered 2020-12-18: 3.375 g via INTRAVENOUS
  Filled 2020-12-18: qty 50

## 2020-12-18 MED ORDER — SODIUM CHLORIDE 0.9 % IV SOLN: INTRAVENOUS | Status: DC

## 2020-12-18 MED ORDER — ROCURONIUM BROMIDE 10 MG/ML (PF) SYRINGE
PREFILLED_SYRINGE | INTRAVENOUS | Status: AC
  Filled 2020-12-18: qty 10

## 2020-12-18 MED ORDER — TRAZODONE HCL 50 MG PO TABS: 25.0000 mg | ORAL_TABLET | Freq: Every evening | ORAL | Status: DC | PRN

## 2020-12-18 MED ORDER — SUCCINYLCHOLINE CHLORIDE 20 MG/ML IJ SOLN
INTRAMUSCULAR | Status: DC | PRN
  Administered 2020-12-18: 100 mg via INTRAVENOUS

## 2020-12-18 MED ORDER — MIDAZOLAM HCL 2 MG/2ML IJ SOLN: INTRAMUSCULAR | Status: DC | PRN

## 2020-12-18 MED ORDER — INDOMETHACIN 50 MG RE SUPP
RECTAL | Status: DC | PRN
  Administered 2020-12-18: 100 mg via RECTAL

## 2020-12-18 MED ORDER — HYDROMORPHONE HCL 1 MG/ML IJ SOLN
0.5000 mg | INTRAMUSCULAR | Status: DC | PRN
  Administered 2020-12-18 – 2020-12-19 (×5): 1 mg via INTRAVENOUS
  Filled 2020-12-18 (×5): qty 1

## 2020-12-18 MED ORDER — SUGAMMADEX SODIUM 500 MG/5ML IV SOLN
INTRAVENOUS | Status: AC
  Filled 2020-12-18: qty 5

## 2020-12-18 MED ORDER — SUGAMMADEX SODIUM 500 MG/5ML IV SOLN
INTRAVENOUS | Status: DC | PRN
  Administered 2020-12-18: 130 mg via INTRAVENOUS

## 2020-12-18 MED ORDER — PROMETHAZINE HCL 25 MG/ML IJ SOLN: 6.2500 mg | INTRAMUSCULAR | Status: DC | PRN

## 2020-12-18 MED ORDER — DEXAMETHASONE SODIUM PHOSPHATE 10 MG/ML IJ SOLN
INTRAMUSCULAR | Status: AC
  Filled 2020-12-18: qty 1

## 2020-12-18 MED ORDER — ROCURONIUM BROMIDE 100 MG/10ML IV SOLN
INTRAVENOUS | Status: DC | PRN
  Administered 2020-12-18: 10 mg via INTRAVENOUS
  Administered 2020-12-18: 30 mg via INTRAVENOUS

## 2020-12-18 MED ORDER — GLYCOPYRROLATE 0.2 MG/ML IJ SOLN
INTRAMUSCULAR | Status: AC
  Filled 2020-12-18: qty 1

## 2020-12-18 MED ORDER — POTASSIUM CHLORIDE IN NACL 20-0.9 MEQ/L-% IV SOLN
INTRAVENOUS | Status: DC
  Filled 2020-12-18 (×6): qty 1000

## 2020-12-18 MED ORDER — PIPERACILLIN-TAZOBACTAM 3.375 G IVPB
3.3750 g | Freq: Three times a day (TID) | INTRAVENOUS | Status: DC
  Administered 2020-12-18 – 2020-12-19 (×4): 3.375 g via INTRAVENOUS
  Filled 2020-12-18 (×4): qty 50

## 2020-12-18 MED ORDER — SUCCINYLCHOLINE CHLORIDE 200 MG/10ML IV SOSY
PREFILLED_SYRINGE | INTRAVENOUS | Status: AC
  Filled 2020-12-18: qty 10

## 2020-12-18 NOTE — Progress Notes (Signed)
flouro 59 sec

## 2020-12-18 NOTE — Anesthesia Procedure Notes (Signed)
Procedure Name: Intubation Performed by: Fletcher-Harrison, Shareka Casale, CRNA Pre-anesthesia Checklist: Patient identified, Emergency Drugs available, Suction available and Patient being monitored Patient Re-evaluated:Patient Re-evaluated prior to induction Oxygen Delivery Method: Circle system utilized Preoxygenation: Pre-oxygenation with 100% oxygen Induction Type: IV induction Ventilation: Mask ventilation without difficulty Laryngoscope Size: McGraph and 3 Grade View: Grade I Tube type: Oral Number of attempts: 1 Airway Equipment and Method: Stylet and Oral airway Placement Confirmation: ETT inserted through vocal cords under direct vision,  positive ETCO2,  breath sounds checked- equal and bilateral and CO2 detector Secured at: 21 cm Tube secured with: Tape Dental Injury: Teeth and Oropharynx as per pre-operative assessment        

## 2020-12-18 NOTE — Transfer of Care (Signed)
Immediate Anesthesia Transfer of Care Note  Patient: Kathy Benton  Procedure(s) Performed: ENDOSCOPIC RETROGRADE CHOLANGIOPANCREATOGRAPHY (ERCP) (N/A )  Patient Location: PACU  Anesthesia Type:General  Level of Consciousness: awake  Airway & Oxygen Therapy: Patient connected to face mask oxygen  Post-op Assessment: Post -op Vital signs reviewed and stable  Post vital signs: stable  Last Vitals:  Vitals Value Taken Time  BP 146/83 12/18/20 1706  Temp 36.7 C 12/18/20 1706  Pulse 68 12/18/20 1711  Resp 20 12/18/20 1706  SpO2 99 % 12/18/20 1711  Vitals shown include unvalidated device data.  Last Pain:  Vitals:   12/18/20 1706  TempSrc:   PainSc: 0-No pain         Complications: No complications documented.

## 2020-12-18 NOTE — Anesthesia Postprocedure Evaluation (Signed)
Anesthesia Post Note  Patient: Kathy Benton  Procedure(s) Performed: ENDOSCOPIC RETROGRADE CHOLANGIOPANCREATOGRAPHY (ERCP) (N/A )  Patient location during evaluation: PACU Anesthesia Type: General Level of consciousness: awake and alert Pain management: pain level controlled Vital Signs Assessment: post-procedure vital signs reviewed and stable Respiratory status: spontaneous breathing, nonlabored ventilation, respiratory function stable and patient connected to nasal cannula oxygen Cardiovascular status: blood pressure returned to baseline and stable Postop Assessment: no apparent nausea or vomiting Anesthetic complications: no   No complications documented.   Last Vitals:  Vitals:   12/18/20 1716 12/18/20 1730  BP: (!) 140/91 (!) 134/92  Pulse: 67 65  Resp:    Temp:    SpO2: 99% 97%    Last Pain:  Vitals:   12/18/20 1730  TempSrc:   PainSc: 0-No pain                 Lenard Simmer

## 2020-12-18 NOTE — Consult Note (Addendum)
[facility] SURGICAL CONSULTATION NOTE (initial) - cpt: 59741   HISTORY OF PRESENT ILLNESS (HPI):  30 y.o. female presented to [facility] ED yesterday for evaluation of abdominal pain. Patient reports she was seen her in the ED on 02/14 for similar complaints of abdominal pain and was diagnosed with cholelithiasis without cholecystitis. She ultimately felt better in the ED and was discharged home with outpatient general surgery follow up. However, she reports that since being home, she has continued to have severe epigastric abdominal pain which radiates to her back. She tried pain medications at home without relief. She endorses associated nausea, emesis, darker urine, and questionably pale stools. She denied any fever, chills, cough, CP, or SOB. She has neve had anything like this in the past. No previous abdominal surgeries. Work up in the ED was concerning for hyperbilirubinemia to 3.8 (now 3.5), AST/ALT elevation, elevated lipase to 400 (now 98), and hypokalemia to 3.4 (now 3.2). She did also undergo MRCP which shows changes to the gallbladder consistent with cholecystitis, there was no gross filling defect in the CDB but this was dilated to 8 mm. She was admitted to the medicine service and started on Zosyn  Surgery is consulted by emergency medicine physician Dr. Rochele Raring, DO in this context for evaluation and management of gallstone pancreatitis.   PAST MEDICAL HISTORY (PMH):  Past Medical History:  Diagnosis Date  . Gestational diabetes 2019   previous pregnancy and current pregnancy   . Headache    migraines  . Hypertension    previous pregnancy      PAST SURGICAL HISTORY (PSH):  No past surgical history on file.   MEDICATIONS:  Prior to Admission medications   Medication Sig Start Date End Date Taking? Authorizing Provider  acetaminophen (TYLENOL) 325 MG tablet Take 2 tablets (650 mg total) by mouth every 4 (four) hours as needed (for pain scale <  4ORtemperature>/=100.5 F). 11/06/20  Yes Gustavo Lah, CNM  calcium carbonate (TUMS - DOSED IN MG ELEMENTAL CALCIUM) 500 MG chewable tablet Chew 1,000 mg by mouth daily as needed for heartburn or indigestion.   Yes [provider]  ibuprofen (ADVIL) 600 MG tablet Take 1 tablet (600 mg total) by mouth every 6 (six) hours as needed for mild pain or moderate pain. 11/06/20  Yes Gustavo Lah, CNM  ondansetron (ZOFRAN ODT) 4 MG disintegrating tablet Take 1 tablet (4 mg total) by mouth every 8 (eight) hours as needed for nausea or vomiting. 12/15/20  Yes Irean Hong, MD  oxyCODONE-acetaminophen (PERCOCET/ROXICET) 5-325 MG tablet Take 1 tablet by mouth every 4 (four) hours as needed for severe pain. 12/15/20  Yes Irean Hong, MD  Prenatal Vit-Fe Fumarate-FA (PRENATAL PO) Take 1 tablet by mouth daily.   Yes [provider]     ALLERGIES:  No Known Allergies   SOCIAL HISTORY:  Social History   Socioeconomic History  . Marital status: Married    Spouse name: jose   . Number of children: 4  . Years of education: 9th  . Highest education level: 9th grade  Occupational History  . Not on file  Tobacco Use  . Smoking status: Never Smoker  . Smokeless tobacco: Never Used  . Tobacco comment: No passive exposure  Vaping Use  . Vaping Use: Never used  Substance and Sexual Activity  . Alcohol use: Not Currently  . Drug use: No  . Sexual activity: Yes    Birth control/protection: Surgical    Comment: BTL  Other Topics Concern  . Not on file  Social History Narrative  . Not on file   Social Determinants of Health   Financial Resource Strain: Medium Risk  . Difficulty of Paying Living Expenses: Somewhat hard  Food Insecurity: No Food Insecurity  . Worried About Programme researcher, broadcasting/film/video in the Last Year: Never true  . Ran Out of Food in the Last Year: Never true  Transportation Needs: No Transportation Needs  . Lack of Transportation (Medical): No  . Lack of  Transportation (Non-Medical): No  Physical Activity: Not on file  Stress: Not on file  Social Connections: Not on file  Intimate Partner Violence: Not At Risk  . Fear of Current or Ex-Partner: No  . Emotionally Abused: No  . Physically Abused: No  . Sexually Abused: No     FAMILY HISTORY:  No family history on file.    REVIEW OF SYSTEMS:  Review of Systems  Constitutional: Negative for chills and fever.  HENT: Negative for congestion and sore throat.   Respiratory: Negative for cough and shortness of breath.   Cardiovascular: Negative for chest pain and palpitations.  Gastrointestinal: Positive for abdominal pain, nausea and vomiting.  Genitourinary: Negative for dysuria and urgency.    VITAL SIGNS:  Temp:  [98 F (36.7 C)-98.7 F (37.1 C)] 98 F (36.7 C) (02/17 0447) Pulse Rate:  [50-77] 61 (02/17 0447) Resp:  [18-20] 18 (02/17 0447) BP: (122-146)/(78-90) 126/84 (02/17 0447) SpO2:  [94 %-100 %] 98 % (02/17 0447) Weight:  [63.5 kg] 63.5 kg (02/16 2105)     Height: 4' 11.02" (149.9 cm) Weight: 63.5 kg BMI (Calculated): 28.26   INTAKE/OUTPUT:  02/16 0701 - 02/17 0700 In: 285.8 [I.V.:243.6; IV Piggyback:42.3] Out: -   PHYSICAL EXAM:  Physical Exam Vitals and nursing note reviewed. Exam conducted with a chaperone present.  Constitutional:      General: She is not in acute distress.    Appearance: She is well-developed. She is obese. She is not ill-appearing.  HENT:     Head: Normocephalic and atraumatic.  Eyes:     General: Scleral icterus (Mild) present.     Extraocular Movements: Extraocular movements intact.  Cardiovascular:     Rate and Rhythm: Normal rate and regular rhythm.  Pulmonary:     Effort: Pulmonary effort is normal. No respiratory distress.  Abdominal:     General: There is no distension.     Palpations: Abdomen is soft.     Tenderness: There is abdominal tenderness in the right upper quadrant and epigastric area. There is no guarding or rebound.  Positive signs include Murphy's sign.  Genitourinary:    Comments: Deferred Skin:    General: Skin is warm and dry.     Coloration: Skin is not jaundiced or pale.  Neurological:     General: No focal deficit present.     Mental Status: She is alert and oriented to person, place, and time.  Psychiatric:        Mood and Affect: Mood normal.        Behavior: Behavior normal.      Labs:  CBC Latest Ref Rng & Units 12/18/2020 12/17/2020 12/15/2020  WBC 4.0 - 10.5 K/uL 5.1 5.9 12.3(H)  Hemoglobin 12.0 - 15.0 g/dL 11.6(L) 12.0 12.7  Hematocrit 36.0 - 46.0 % 34.6(L) 36.5 38.4  Platelets 150 - 400 K/uL 242 262 295   CMP Latest Ref Rng & Units 12/18/2020 12/17/2020 12/15/2020  Glucose 70 - 99 mg/dL 99 161(W)  117(H)  BUN 6 - 20 mg/dL <3(U) 6 15  Creatinine 0.44 - 1.00 mg/dL 2.02(R) 4.27 0.62  Sodium 135 - 145 mmol/L 137 136 135  Potassium 3.5 - 5.1 mmol/L 3.2(L) 3.4(L) 3.5  Chloride 98 - 111 mmol/L 106 105 103  CO2 22 - 32 mmol/L 24 23 23   Calcium 8.9 - 10.3 mg/dL ) 3.7(S) 9.1  Total Protein 6.5 - 8.1 g/dL 6.5 7.5 7.8  Total Bilirubin 0.3 - 1.2 mg/dL 2.8(B) 1.5(V) 1.1  Alkaline Phos 38 - 126 U/L 186(H) 219(H) 110  AST 15 - 41 U/L 92(H) 130(H) 32  ALT 0 - 44 U/L 165(H) 196(H) 27    Imaging studies:   MRCP (12/17/2020) personally reviewed showing gallbladder wall thickening, mild CBD dilation, no gross filling defects appreciable, and radiologist report reviewed below:  IMPRESSION: 1. Gallbladder wall thickening and hyperenhancement, new compared to prior CT examination, with small gallstones in the gallbladder. Appearance is consistent with acute cholecystitis. 2. There is mild intrahepatic biliary ductal dilatation and the common bile duct is mildly dilated measuring up to 8 mm, new compared to prior examination. Motion somewhat limits assessment of the biliary tree, however, appearance is suspicious for at least one tiny gallstone near the ampulla. Consider ERCP to further  evaluate.   Assessment/Plan: (ICD-10's: K85.10) 30 y.o. female with abdominal pain, nausea, emesis found to have hyperbilirubinemia and elevated lipase concerning for likely gallstone pancreatitis and possible concomitant cholecystitis given her known history of cholelithiasis   - Would recommend she remain NPO for now given her persistent degree of pain. If her pain improves later today, would recommend CLD and NPO at midnight.    - Can consider GI evaluation for potential ERCP  - I will tentatively plan for robotic assisted laparoscopic cholecystectomy tomorrow (02/18) with Dr [id number] pending GI work up and OR/Anesthesia availability  - Continue IVF resuscitation  - Continue IV Abx (Zosyn)   - Monitor abdominal examination  - Pain control prn; antiemetics prn  - Further management per primary service; we will follow   All of the above findings and recommendations were discussed with the patient, and all of patient's questions were answered to her expressed satisfaction.  Thank you for the opportunity to participate in this patient's care.   -- Claudine Mouton, PA-C  [facility] 12/18/2020, 7:35 AM [phone] M-F: 7am - 4pm

## 2020-12-18 NOTE — Progress Notes (Signed)
Pharmacy Antibiotic Note  Kathy Benton is a 30 y.o. female admitted on 12/17/2020 with cholecystitis.  Pharmacy has been consulted for Zosyn dosing.  Plan: Zosyn 3.375g IV q8h (4 hour infusion).  Height: 4\' 11"  (149.9 cm) Weight: 63.5 kg (140 lb) IBW/kg (Calculated) : 43.2  Temp (24hrs), Avg:98.4 F (36.9 C), Min:98.2 F (36.8 C), Max:98.6 F (37 C)  Recent Labs  Lab 12/15/20 0218 12/17/20 2110  WBC 12.3* 5.9  CREATININE 0.64 0.53    Estimated Creatinine Clearance: 82.5 mL/min (by C-G formula based on SCr of 0.53 mg/dL).    No Known Allergies  Antimicrobials this admission:   >>    >>   Dose adjustments this admission:   Microbiology results:  BCx:   UCx:    Sputum:    MRSA PCR:   Thank you for allowing pharmacy to be a part of this patient's care.  Corry Storie D 12/18/2020 1:30 AM

## 2020-12-18 NOTE — Progress Notes (Signed)
Patient ID: Kathy Benton, female   DOB: 16-Feb-1991, 30 y.o.   MRN: [id number] [facility] PROGRESS NOTE  Candis Kabel BSJ:[id number] DOB: 07-03-1991 DOA: 12/17/2020 PCP: Department, [facility]  HPI/Subjective: Patient came into the hospital with abdominal pain severe in nature.  Found to have a gallstone pancreatitis.  She states while she was pregnant she did not have any symptoms.  Having some nausea but no vomiting.  Objective: Vitals:   12/18/20 0738 12/18/20 1111  BP: 136/86 119/80  Pulse: (!) 57 66  Resp: 20 18  Temp: 97.7 F (36.5 C) 99 F (37.2 C)  SpO2:  98%    Intake/Output Summary (Last 24 hours) at 12/18/2020 1442 Last data filed at 12/18/2020 0311 Gross per 24 hour  Intake 285.8 ml  Output --  Net 285.8 ml   Filed Weights   12/17/20 2105  Weight: 63.5 kg    ROS: Review of Systems  Respiratory: Negative for shortness of breath.   Cardiovascular: Negative for chest pain.  Gastrointestinal: Positive for abdominal pain and nausea. Negative for diarrhea and vomiting.   Exam: Physical Exam HENT:     Head: Normocephalic.     Mouth/Throat:     Pharynx: No oropharyngeal exudate.  Eyes:     General: Lids are normal.     Conjunctiva/sclera: Conjunctivae normal.  Cardiovascular:     Rate and Rhythm: Normal rate and regular rhythm.     Heart sounds: Normal heart sounds, S1 normal and S2 normal.  Pulmonary:     Breath sounds: Examination of the right-lower field reveals decreased breath sounds and wheezing. Examination of the left-lower field reveals decreased breath sounds and wheezing. Decreased breath sounds and wheezing present. No rhonchi or rales.  Abdominal:     Palpations: Abdomen is soft.     Tenderness: There is abdominal tenderness in the right upper quadrant and epigastric area.  Musculoskeletal:     Right ankle: No swelling.     Left ankle: No swelling.  Skin:    General: Skin is warm.     Findings: No rash.  Neurological:      Mental Status: She is alert and oriented to person, place, and time.       Data Reviewed: Basic Metabolic Panel: Recent Labs  Lab 12/15/20 0218 12/17/20 2110 12/18/20 0437  NA 135 136 137  K 3.5 3.4* 3.2*  CL 103 105 106  CO2 23 23 24   GLUCOSE 117* 115* 99  BUN 15 6 <5*  CREATININE 0.64 0.53 0.42*  CALCIUM 9.1 8.8* 8.4*   Liver Function Tests: Recent Labs  Lab 12/15/20 0218 12/17/20 2110 12/18/20 0437  AST 32 130* 92*  ALT 27 196* 165*  ALKPHOS 110 219* 186*  BILITOT 1.1 3.8* 3.5*  PROT 7.8 7.5 6.5  ALBUMIN 4.5 3.9 3.4*   Recent Labs  Lab 12/15/20 0218 12/17/20 2110 12/18/20 0437  LIPASE 40 400* 98*   CBC: Recent Labs  Lab 12/15/20 0218 12/17/20 2110 12/18/20 0437  WBC 12.3* 5.9 5.1  HGB 12.7 12.0 11.6*  HCT 38.4 36.5 34.6*  MCV 90.6 89.7 89.6  PLT 295 262 242     Recent Results (from the past 240 hour(s))  Resp Panel by RT-PCR (Flu A&B, Covid) Nasopharyngeal Swab     Status: None   Collection Time: 12/18/20 12:43 AM   Specimen: Nasopharyngeal Swab; Nasopharyngeal(NP) swabs in vial transport medium  Result Value Ref Range Status   SARS Coronavirus 2 by RT PCR NEGATIVE NEGATIVE Final  Comment: (NOTE) SARS-CoV-2 target nucleic acids are NOT DETECTED.  The SARS-CoV-2 RNA is generally detectable in upper respiratory specimens during the acute phase of infection. The lowest concentration of SARS-CoV-2 viral copies this assay can detect is 138 copies/mL. A negative result does not preclude SARS-Cov-2 infection and should not be used as the sole basis for treatment or other patient management decisions. A negative result may occur with  improper specimen collection/handling, submission of specimen other than nasopharyngeal swab, presence of viral mutation(s) within the areas targeted by this assay, and inadequate number of viral copies(<138 copies/mL). A negative result must be combined with clinical observations, patient history, and  epidemiological information. The expected result is Negative.  Fact Sheet for Patients:  BloggerCourse.comhttps://www.fda.gov/media/152166/download  Fact Sheet for Healthcare Providers:  SeriousBroker.ithttps://www.fda.gov/media/152162/download  This test is no t yet approved or cleared by the [place] FDA and  has been authorized for detection and/or diagnosis of SARS-CoV-2 by FDA under an Emergency Use Authorization (EUA). This EUA will remain  in effect (meaning this test can be used) for the duration of the COVID-19 declaration under Section 564(b)(1) of the Act, 21 U.S.C.section 360bbb-3(b)(1), unless the authorization is terminated  or revoked sooner.       Influenza A by PCR NEGATIVE NEGATIVE Final   Influenza B by PCR NEGATIVE NEGATIVE Final    Comment: (NOTE) The Xpert Xpress SARS-CoV-2/FLU/RSV plus assay is intended as an aid in the diagnosis of influenza from Nasopharyngeal swab specimens and should not be used as a sole basis for treatment. Nasal washings and aspirates are unacceptable for Xpert Xpress SARS-CoV-2/FLU/RSV testing.  Fact Sheet for Patients: BloggerCourse.comhttps://www.fda.gov/media/152166/download  Fact Sheet for Healthcare Providers: SeriousBroker.ithttps://www.fda.gov/media/152162/download  This test is not yet approved or cleared by the [place] FDA and has been authorized for detection and/or diagnosis of SARS-CoV-2 by FDA under an Emergency Use Authorization (EUA). This EUA will remain in effect (meaning this test can be used) for the duration of the COVID-19 declaration under Section 564(b)(1) of the Act, 21 U.S.C. section 360bbb-3(b)(1), unless the authorization is terminated or revoked.  Performed at [facility], [address] [place], [place] [address]      Studies: MR 3D Recon At Scanner  Result Date: 12/18/2020 CLINICAL DATA:  Abdominal pain, cholelithiasis EXAM: MRI ABDOMEN WITHOUT AND WITH CONTRAST (INCLUDING MRCP) TECHNIQUE: Multiplanar multisequence MR imaging of the  abdomen was performed both before and after the administration of intravenous contrast. Heavily T2-weighted images of the biliary and pancreatic ducts were obtained, and three-dimensional MRCP images were rendered by post processing. CONTRAST:  6mL GADAVIST GADOBUTROL 1 MMOL/ML IV SOLN COMPARISON:  CT abdomen pelvis, 12/15/2020 FINDINGS: Lower chest: No acute findings. Hepatobiliary: No mass or other parenchymal abnormality identified. Gallbladder wall thickening and hyperenhancement, new compared to prior examination, with small gallstones in the gallbladder. There is mild intrahepatic biliary ductal dilatation and the common bile duct is mildly dilated measuring up to 8 mm, new compared to prior examination. Motion somewhat limits assessment of the biliary tree, however appearance is suspicious for at least one tiny gallstone near the ampulla (series 2, image 17). Pancreas: No mass, inflammatory changes, or other parenchymal abnormality identified. Spleen:  Within normal limits in size and appearance. Adrenals/Urinary Tract: No masses identified. No evidence of hydronephrosis. Stomach/Bowel: Visualized portions within the abdomen are unremarkable. Vascular/Lymphatic: No pathologically enlarged lymph nodes identified. No abdominal aortic aneurysm demonstrated. Other:  None. Musculoskeletal: No suspicious bone lesions identified. IMPRESSION: 1. Gallbladder wall thickening and hyperenhancement, new compared  to prior CT examination, with small gallstones in the gallbladder. Appearance is consistent with acute cholecystitis. 2. There is mild intrahepatic biliary ductal dilatation and the common bile duct is mildly dilated measuring up to 8 mm, new compared to prior examination. Motion somewhat limits assessment of the biliary tree, however, appearance is suspicious for at least one tiny gallstone near the ampulla. Consider ERCP to further evaluate. Preliminary report was provided by Dr. Marisue Humble at 12:07 a.m.,  12/18/2020 as documented in PACS. Electronically Signed   By: Lauralyn Primes M.D.   On: 12/18/2020 07:29   MR ABDOMEN MRCP W WO CONTAST  Result Date: 12/18/2020 CLINICAL DATA:  Abdominal pain, cholelithiasis EXAM: MRI ABDOMEN WITHOUT AND WITH CONTRAST (INCLUDING MRCP) TECHNIQUE: Multiplanar multisequence MR imaging of the abdomen was performed both before and after the administration of intravenous contrast. Heavily T2-weighted images of the biliary and pancreatic ducts were obtained, and three-dimensional MRCP images were rendered by post processing. CONTRAST:  63mL GADAVIST GADOBUTROL 1 MMOL/ML IV SOLN COMPARISON:  CT abdomen pelvis, 12/15/2020 FINDINGS: Lower chest: No acute findings. Hepatobiliary: No mass or other parenchymal abnormality identified. Gallbladder wall thickening and hyperenhancement, new compared to prior examination, with small gallstones in the gallbladder. There is mild intrahepatic biliary ductal dilatation and the common bile duct is mildly dilated measuring up to 8 mm, new compared to prior examination. Motion somewhat limits assessment of the biliary tree, however appearance is suspicious for at least one tiny gallstone near the ampulla (series 2, image 17). Pancreas: No mass, inflammatory changes, or other parenchymal abnormality identified. Spleen:  Within normal limits in size and appearance. Adrenals/Urinary Tract: No masses identified. No evidence of hydronephrosis. Stomach/Bowel: Visualized portions within the abdomen are unremarkable. Vascular/Lymphatic: No pathologically enlarged lymph nodes identified. No abdominal aortic aneurysm demonstrated. Other:  None. Musculoskeletal: No suspicious bone lesions identified. IMPRESSION: 1. Gallbladder wall thickening and hyperenhancement, new compared to prior CT examination, with small gallstones in the gallbladder. Appearance is consistent with acute cholecystitis. 2. There is mild intrahepatic biliary ductal dilatation and the common bile  duct is mildly dilated measuring up to 8 mm, new compared to prior examination. Motion somewhat limits assessment of the biliary tree, however, appearance is suspicious for at least one tiny gallstone near the ampulla. Consider ERCP to further evaluate. Preliminary report was provided by Dr. Marisue Humble at 12:07 a.m., 12/18/2020 as documented in PACS. Electronically Signed   By: Lauralyn Primes M.D.   On: 12/18/2020 07:29    Scheduled Meds: . indomethacin  100 mg Rectal Once   Continuous Infusions: . sodium chloride    . 0.9 % NaCl with KCl 20 mEq / L 125 mL/hr at 12/18/20 1033  . piperacillin-tazobactam (ZOSYN)  IV 3.375 g (12/18/20 1426)    Assessment/Plan:  1. Acute gallstone pancreatitis with choledocholithiasis and cholecystitis.  MRCP shows a potential stone in the biliary tree and gallbladder wall thickening.  Case discussed with general surgery and gastroenterology.  Patient will be set up for an ERCP and cholecystectomy prior to disposition.  On empiric Zosyn. 2. Elevated liver function test secondary to gallstone pancreatitis and choledocholithiasis. 3. Hypokalemia.  Change to potassium in IV fluids 4. Recent pregnancy with vaginal delivery on 11/05/2020.  Also had Covid infection at that time.        Code Status:     Code Status Orders  (From admission, onward)         Start     Ordered   12/18/20 0140  Full code  Continuous        12/18/20 0139        Code Status History    Date Active Date Inactive Code Status Order ID Comments User Context   11/05/2020 1848 11/06/2020 2132 Full Code [id number]  Schermerhorn, Ihor Austin, MD Inpatient   11/05/2020 1652 11/05/2020 1848 Full Code [id number]  Haroldine Laws, CNM Inpatient   11/04/2020 1436 11/04/2020 2039 Full Code [id number]  Caren Hazy, RN Inpatient   06/24/2018 2254 06/26/2018 1956 Full Code [id number]  Randa Ngo, CNM Inpatient   06/24/2018 1611 06/24/2018 2254 Full Code [id number]  Randa Ngo, CNM Inpatient   06/19/2018  0933 06/19/2018 1438 Full Code [id number]  Christeen Douglas, MD Inpatient   Advance Care Planning Activity     Disposition Plan: Status is: Inpatient Dispo: The patient is from: Home              Anticipated d/c is to: Home              Anticipated d/c date is: Likely a couple days more in the hospital.  We will have to have gallbladder removed during the hospital course.              Patient currently receiving IV antibiotics and will need ERCP and cholecystectomy prior to disposition   Difficult to place patient.  No  Consultants:  Gastroenterology  General surgery  Antibiotics:  Zosyn  Time spent: 32 minutes, case discussed with general surgery and GI.  Carlisha Wisler [employer]  [facility]

## 2020-12-18 NOTE — H&P (Signed)
[facility] History and Physical  Liat Mayol IWL:[id number] DOB: [birth year] DOA: 12/17/2020  Referring physician: Dr. Elesa Massed PCP: Department, [facility]   Chief Complaint: abdominal pain  HPI: Kathy Benton is a 30 y.o. female with history of hypertension, GDM, cholelithiasis, who presents with abdominal pain.  Patient was seen in this ED 2 days prior for abdominal pain, and was ultimately found to have cholelithiasis without any findings of acute cholecystitis. She reports that since she left she has barely had anything to eat over the past 2 days. Today she had 2 granola bars and subsequently had significantly worsening pain which prompted her to return to the ED. She endorses nausea but no vomiting. Pain is located primarily in the right upper quadrant and epigastrium. She endorses feeling feverish but when she has checked her temperature she is not been febrile.  In the ED patient's initial vital signs were unremarkable. Work-up was notable for CMP with lipase elevated from 40-400, AST from 32-130, and ALT from 27-196. In addition her total bilirubin had risen from 1.1-3.8 since 2 days prior. CBC was unremarkable. She subsequently underwent an MRCP, final read is pending but preliminary is suspicious for acute cholecystitis, no evidence of stones in the ducts. Her case was discussed with general surgery who will plan to see her in the morning. She was given a dose of Zosyn, IV morphine for pain, IV fluids, and admitted for further management.  Review of Systems:  Pertinent positives and negative per HPI, all others reviewed and negative  Past Medical History:  Diagnosis Date  . Gestational diabetes 2019   previous pregnancy and current pregnancy   . Headache    migraines  . Hypertension    previous pregnancy    No past surgical history on file. Social History:  reports that she has never smoked. She has never used smokeless tobacco. She reports previous alcohol  use. She reports that she does not use drugs.  No Known Allergies  No family history on file.   Prior to Admission medications   Medication Sig Start Date End Date Taking? Authorizing Provider  acetaminophen (TYLENOL) 325 MG tablet Take 2 tablets (650 mg total) by mouth every 4 (four) hours as needed (for pain scale < 4ORtemperature>/=100.5 F). 11/06/20  Yes Gustavo Lah, CNM  calcium carbonate (TUMS - DOSED IN MG ELEMENTAL CALCIUM) 500 MG chewable tablet Chew 1,000 mg by mouth daily as needed for heartburn or indigestion.   Yes [provider]  ibuprofen (ADVIL) 600 MG tablet Take 1 tablet (600 mg total) by mouth every 6 (six) hours as needed for mild pain or moderate pain. 11/06/20  Yes Gustavo Lah, CNM  ondansetron (ZOFRAN ODT) 4 MG disintegrating tablet Take 1 tablet (4 mg total) by mouth every 8 (eight) hours as needed for nausea or vomiting. 12/15/20  Yes Irean Hong, MD  oxyCODONE-acetaminophen (PERCOCET/ROXICET) 5-325 MG tablet Take 1 tablet by mouth every 4 (four) hours as needed for severe pain. 12/15/20  Yes Irean Hong, MD  Prenatal Vit-Fe Fumarate-FA (PRENATAL PO) Take 1 tablet by mouth daily.   Yes [provider]   Physical Exam: Vitals:   12/17/20 2109 12/17/20 2222 12/18/20 0011 12/18/20 0115  BP: 136/89 122/78 133/84 (!) 146/90  Pulse: 77 63 (!) 50 (!) 59  Resp: 20 20 18 20   Temp: 98.6 F (37 C)   98.2 F (36.8 C)  TempSrc: Oral     SpO2: 98% 98% 96% 94%  Weight:  Height:        Wt Readings from Last 3 Encounters:  12/17/20 63.5 kg  12/15/20 63.5 kg  11/05/20 79.4 kg     . General:  Appears uncomfortable . Eyes: PERRL, normal lids, irises & conjunctiva . ENT: grossly normal hearing, lips & tongue . Neck: no masses . Cardiovascular: RRR, no m/r/g. No LE edema. . Telemetry: SR, no arrhythmias  . Respiratory: CTA bilaterally, no w/r/r. Normal respiratory effort. . Abdomen: soft, normal bowel sounds, tender to palpation in RUQ  and epigastrium without rebound or guarding . Skin: no rash or induration seen on limited exam . Musculoskeletal: grossly normal tone BUE/BLE . Psychiatric: grossly normal mood and affect, speech fluent and appropriate . Neurologic: grossly non-focal.          Labs on Admission:  Basic Metabolic Panel: Recent Labs  Lab 12/15/20 0218 12/17/20 2110  NA 135 136  K 3.5 3.4*  CL 103 105  CO2 23 23  GLUCOSE 117* 115*  BUN 15 6  CREATININE 0.64 0.53  CALCIUM 9.1 8.8*   Liver Function Tests: Recent Labs  Lab 12/15/20 0218 12/17/20 2110  AST 32 130*  ALT 27 196*  ALKPHOS 110 219*  BILITOT 1.1 3.8*  PROT 7.8 7.5  ALBUMIN 4.5 3.9   Recent Labs  Lab 12/15/20 0218 12/17/20 2110  LIPASE 40 400*   No results for input(s): AMMONIA in the last 168 hours. CBC: Recent Labs  Lab 12/15/20 0218 12/17/20 2110  WBC 12.3* 5.9  HGB 12.7 12.0  HCT 38.4 36.5  MCV 90.6 89.7  PLT 295 262   Cardiac Enzymes: No results for input(s): CKTOTAL, CKMB, CKMBINDEX, TROPONINI in the last 168 hours.  BNP (last 3 results) No results for input(s): BNP in the last 8760 hours.  ProBNP (last 3 results) No results for input(s): PROBNP in the last 8760 hours.  CBG: No results for input(s): GLUCAP in the last 168 hours.  Radiological Exams on Admission: No results found.  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 Surgery aware of patient, they will follow along and assess in the a.m. for timing of surgical intervention. -Diet n.p.o., advance  as tolerated -LR at 125 cc an hour -Continue Zosyn per pharmacy protocol -Pain meds as needed -Discussed with general surgery in a.m.  Code Status: Full code DVT Prophylaxis: SCDs Family Communication: None Disposition Plan: Inpatient MedSurg  Time spent: 50 min  Venora Maples MD/MPH [facility]

## 2020-12-18 NOTE — Op Note (Addendum)
[facility] Patient Name: Kathy Benton Procedure Date: 12/18/2020 1:40 PM MRN: [id number] Account #: [id number] Date of Birth: 1991/05/30 Admit Type: Inpatient Age: 30 Room: [facility] ENDO ROOM 3 Gender: Female Note Status: Finalized Procedure:             ERCP Indications:           Common bile duct stone(s) Providers:             Midge Minium MD, MD Referring MD:          No Local Md, MD (Referring MD) Medicines:             General Anesthesia Complications:         No immediate complications. Procedure:             Pre-Anesthesia Assessment:                        - Prior to the procedure, a History and Physical was                         performed, and patient medications and allergies were                         reviewed. The patient's tolerance of previous                         anesthesia was also reviewed. The risks and benefits                         of the procedure and the sedation options and risks                         were discussed with the patient. All questions were                         answered, and informed consent was obtained. Prior                         Anticoagulants: The patient has taken no previous                         anticoagulant or antiplatelet agents. ASA Grade                         Assessment: II - A patient with mild systemic disease.                         After reviewing the risks and benefits, the patient                         was deemed in satisfactory condition to undergo the                         procedure.                        After obtaining informed consent, the scope was passed  under direct vision. Throughout the procedure, the                         patient's blood pressure, pulse, and oxygen                         saturations were monitored continuously. The [employer] D single use duodenoscope was                          introduced through the mouth, and used to inject                         contrast into and used to inject contrast into the                         bile duct. The ERCP was accomplished without                         difficulty. The patient tolerated the procedure well. Findings:      The scout film was normal. The esophagus was successfully intubated       under direct vision. The scope was advanced to a normal major papilla in       the descending duodenum without detailed examination of the pharynx,       larynx and associated structures, and upper GI tract. The upper GI tract       was grossly normal. The bile duct was deeply cannulated with the       short-nosed traction sphincterotome. Contrast was injected. I personally       interpreted the bile duct images. There was brisk flow of contrast       through the ducts. Image quality was excellent. Contrast extended to the       entire biliary tree. The lower third of the main bile duct contained       filling defect(s). A wire was passed into the biliary tree. A 7 mm       biliary sphincterotomy was made with a traction (standard)       sphincterotome using ERBE electrocautery. There was no       post-sphincterotomy bleeding. The biliary tree was swept with a 15 mm       balloon starting at the bifurcation. Two stones were removed. No stones       remained. Impression:            - A filling defect was seen on the cholangiogram.                        - Choledocholithiasis was found. Complete removal was                         accomplished by biliary sphincterotomy and balloon                         extraction.                        - A biliary sphincterotomy was performed.                        -  The biliary tree was swept. Recommendation:        - Return patient to hospital ward for ongoing care.                        - Clear liquid diet.                        - Watch for pancreatitis, bleeding, perforation, and                          cholangitis. Procedure Code(s):     --- Professional ---                        [phone], Endoscopic retrograde cholangiopancreatography                         (ERCP); with removal of calculi/debris from                         biliary/pancreatic duct(s)                        [id number], Endoscopic retrograde cholangiopancreatography                         (ERCP); with sphincterotomy/papillotomy                        [phone], Endoscopic catheterization of the biliary                         ductal system, radiological supervision and                         interpretation Diagnosis Code(s):     --- Professional ---                        K80.50, Calculus of bile duct without cholangitis or                         cholecystitis without obstruction                        R93.2, Abnormal findings on diagnostic imaging of                         liver and biliary tract CPT copyright 2019 American Medical Association. All rights reserved. The codes documented in this report are preliminary and upon coder review may  be revised to meet current compliance requirements. Midge Minium MD, MD 12/18/2020 4:56:14 PM This report has been signed electronically. Number of Addenda: 0 Note Initiated On: 12/18/2020 1:40 PM Estimated Blood Loss:  Estimated blood loss: none.      [facility]

## 2020-12-18 NOTE — Anesthesia Preprocedure Evaluation (Signed)
Anesthesia Evaluation  Patient identified by MRN, date of birth, ID band Patient awake    Reviewed: Allergy & Precautions, H&P , NPO status , Patient's Chart, lab work & pertinent test results, reviewed documented beta blocker date and time   History of Anesthesia Complications Negative for: history of anesthetic complications  Airway Mallampati: III  TM Distance: >3 FB Neck ROM: full    Dental  (+) Dental Advidsory Given, Teeth Intact   Pulmonary neg pulmonary ROS,    Pulmonary exam normal breath sounds clear to auscultation       Cardiovascular Exercise Tolerance: Good hypertension (gestational), (-) angina(-) Past MI and (-) Cardiac Stents Normal cardiovascular exam(-) dysrhythmias (-) Valvular Problems/Murmurs Rhythm:regular Rate:Normal     Neuro/Psych negative neurological ROS  negative psych ROS   GI/Hepatic negative GI ROS, Neg liver ROS,   Endo/Other  diabetes, Gestational  Renal/GU negative Renal ROS  negative genitourinary   Musculoskeletal   Abdominal   Peds  Hematology negative hematology ROS (+)   Anesthesia Other Findings Past Medical History: 2019: Gestational diabetes     Comment:  previous pregnancy and current pregnancy  No date: Headache     Comment:  migraines No date: Hypertension     Comment:  previous pregnancy    Reproductive/Obstetrics negative OB ROS                             Anesthesia Physical Anesthesia Plan  ASA: II  Anesthesia Plan: General   Post-op Pain Management:    Induction: Intravenous  PONV Risk Score and Plan: 3 and Ondansetron, Dexamethasone, Midazolam, Treatment may vary due to age or medical condition and Promethazine  Airway Management Planned: Oral ETT  Additional Equipment:   Intra-op Plan:   Post-operative Plan: Extubation in OR  Informed Consent: I have reviewed the patients History and Physical, chart, labs and  discussed the procedure including the risks, benefits and alternatives for the proposed anesthesia with the patient or authorized representative who has indicated his/her understanding and acceptance.     Dental Advisory Given  Plan Discussed with: Anesthesiologist, CRNA and Surgeon  Anesthesia Plan Comments:         Anesthesia Quick Evaluation

## 2020-12-18 NOTE — Consult Note (Signed)
Vonda Antigua, MD [address] [place], [place], [place], [address] [address], [place], [place], [place], [address] Phone: [phone]  Fax: [phone]  Consultation  Referring Provider:     Dr. Earleen Newport Primary Care Physician:  Department, [facility] Reason for Consultation:     Choledocholithiasis  Date of Admission:  12/17/2020 Date of Consultation:  12/18/2020         HPI:   Saman Giddens is a 30 y.o. female presents with 5-day history of abdominal pain, epigastric to right upper quadrant, intermittent, 10/10, nonradiating, associated with nausea and vomiting.  No hematemesis.  Patient initially presented to the ER 3 to 4 days ago but was discharged as liver enzymes were normal, CT scan did not show any concerning findings.  However, symptoms did not resolve and she came back to the ER and was noted to have acute elevation in bilirubin, alk phos and MRCP showed acute cholecystitis with mild intrahepatic biliary duct dilatation and common bile duct noted to be mildly dilated up to 8 mm with appearance suspicious for "at least one tiny gallstone near the ampulla."  Patient denies any previous history of similar symptoms.  Denies any heavy alcohol use or any new medications or over-the-counter supplements.  Denies any use of hepatotoxic drugs  Past Medical History:  Diagnosis Date  . Gestational diabetes 2019   previous pregnancy and current pregnancy   . Headache    migraines  . Hypertension    previous pregnancy     No past surgical history on file.  Prior to Admission medications   Medication Sig Start Date End Date Taking? Authorizing Provider  acetaminophen (TYLENOL) 325 MG tablet Take 2 tablets (650 mg total) by mouth every 4 (four) hours as needed (for pain scale < 4ORtemperature>/=100.5 F). 11/06/20  Yes Minda Meo, CNM  calcium carbonate (TUMS - DOSED IN MG ELEMENTAL CALCIUM) 500 MG chewable tablet Chew 1,000 mg by mouth daily as needed  for heartburn or indigestion.   Yes [provider]  ibuprofen (ADVIL) 600 MG tablet Take 1 tablet (600 mg total) by mouth every 6 (six) hours as needed for mild pain or moderate pain. 11/06/20  Yes Minda Meo, CNM  ondansetron (ZOFRAN ODT) 4 MG disintegrating tablet Take 1 tablet (4 mg total) by mouth every 8 (eight) hours as needed for nausea or vomiting. 12/15/20  Yes Paulette Blanch, MD  oxyCODONE-acetaminophen (PERCOCET/ROXICET) 5-325 MG tablet Take 1 tablet by mouth every 4 (four) hours as needed for severe pain. 12/15/20  Yes Paulette Blanch, MD  Prenatal Vit-Fe Fumarate-FA (PRENATAL PO) Take 1 tablet by mouth daily.   Yes [provider]    No family history on file.   Social History   Tobacco Use  . Smoking status: Never Smoker  . Smokeless tobacco: Never Used  . Tobacco comment: No passive exposure  Vaping Use  . Vaping Use: Never used  Substance Use Topics  . Alcohol use: Not Currently  . Drug use: No    Allergies as of 12/17/2020  . (No Known Allergies)    Review of Systems:    All systems reviewed and negative except where noted in HPI.   Physical Exam:  Vital signs in last 24 hours: Vitals:   12/18/20 0138 12/18/20 0447 12/18/20 0738 12/18/20 1111  BP: (!) 144/86 126/84 136/86 119/80  Pulse: (!) 53 61 (!) 57 66  Resp: _0 Temp: 98.7 F (37.1 C)  75 F (36.7 C) 97.7 F (36.5 C) 99 F (37.2 C)  TempSrc:  Oral Oral Oral  SpO2: 100% 98%  98%  Weight:      Height: 4' 11.02" (1.499 m)      Last BM Date: 12/18/20 General:   Pleasant, cooperative in NAD Head:  Normocephalic and atraumatic. Eyes:   No icterus.   Conjunctiva pink. PERRLA. Ears:  Normal auditory acuity. Neck:  Supple; no masses or thyroidomegaly Lungs: Respirations even and unlabored. Lungs clear to auscultation bilaterally.   No wheezes, crackles, or rhonchi.  Abdomen:  Soft, nondistended, tender to palpation right upper quadrant and epigastric region. Normal bowel sounds.  No appreciable masses or hepatomegaly.  No rebound or guarding.  Neurologic:  Alert and oriented x3;  grossly normal neurologically. Skin:  Intact without significant lesions or rashes. Cervical Nodes:  No significant cervical adenopathy. Psych:  Alert and cooperative. Normal affect.  LAB RESULTS: Recent Labs    12/17/20 2110 12/18/20 0437  WBC 5.9 5.1  HGB 12.0 11.6*  HCT 36.5 34.6*  PLT 262 242   BMET Recent Labs    12/17/20 2110 12/18/20 0437  NA 136 137  K 3.4* 3.2*  CL 105 106  CO2 23 24  GLUCOSE 115* 99  BUN 6 <5*  CREATININE 0.53 0.42*  CALCIUM 8.8* 8.4*   LFT Recent Labs    12/18/20 0437  PROT 6.5  ALBUMIN 3.4*  AST 92*  ALT 165*  ALKPHOS 186*  BILITOT 3.5*   PT/INR Recent Labs    12/18/20 0437  LABPROT 14.2  INR 1.1    STUDIES: MR 3D Recon At Scanner  Result Date: 12/18/2020 CLINICAL DATA:  Abdominal pain, cholelithiasis EXAM: MRI ABDOMEN WITHOUT AND WITH CONTRAST (INCLUDING MRCP) TECHNIQUE: Multiplanar multisequence MR imaging of the abdomen was performed both before and after the administration of intravenous contrast. Heavily T2-weighted images of the biliary and pancreatic ducts were obtained, and three-dimensional MRCP images were rendered by post processing. CONTRAST:  36m GADAVIST GADOBUTROL 1 MMOL/ML IV SOLN COMPARISON:  CT abdomen pelvis, 12/15/2020 FINDINGS: Lower chest: No acute findings. Hepatobiliary: No mass or other parenchymal abnormality identified. Gallbladder wall thickening and hyperenhancement, new compared to prior examination, with small gallstones in the gallbladder. There is mild intrahepatic biliary ductal dilatation and the common bile duct is mildly dilated measuring up to 8 mm, new compared to prior examination. Motion somewhat limits assessment of the biliary tree, however appearance is suspicious for at least one tiny gallstone near the ampulla (series 2, image 17). Pancreas: No mass, inflammatory changes, or other  parenchymal abnormality identified. Spleen:  Within normal limits in size and appearance. Adrenals/Urinary Tract: No masses identified. No evidence of hydronephrosis. Stomach/Bowel: Visualized portions within the abdomen are unremarkable. Vascular/Lymphatic: No pathologically enlarged lymph nodes identified. No abdominal aortic aneurysm demonstrated. Other:  None. Musculoskeletal: No suspicious bone lesions identified. IMPRESSION: 1. Gallbladder wall thickening and hyperenhancement, new compared to prior CT examination, with small gallstones in the gallbladder. Appearance is consistent with acute cholecystitis. 2. There is mild intrahepatic biliary ductal dilatation and the common bile duct is mildly dilated measuring up to 8 mm, new compared to prior examination. Motion somewhat limits assessment of the biliary tree, however, appearance is suspicious for at least one tiny gallstone near the ampulla. Consider ERCP to further evaluate. Preliminary report was provided by Dr. SJoelyn Omsat 12:07 a.m., 12/18/2020 as documented in PACS. Electronically Signed   By: AEddie CandleM.D.   On: 12/18/2020 07:29  MR ABDOMEN MRCP W WO CONTAST  Result Date: 12/18/2020 CLINICAL DATA:  Abdominal pain, cholelithiasis EXAM: MRI ABDOMEN WITHOUT AND WITH CONTRAST (INCLUDING MRCP) TECHNIQUE: Multiplanar multisequence MR imaging of the abdomen was performed both before and after the administration of intravenous contrast. Heavily T2-weighted images of the biliary and pancreatic ducts were obtained, and three-dimensional MRCP images were rendered by post processing. CONTRAST:  68m GADAVIST GADOBUTROL 1 MMOL/ML IV SOLN COMPARISON:  CT abdomen pelvis, 12/15/2020 FINDINGS: Lower chest: No acute findings. Hepatobiliary: No mass or other parenchymal abnormality identified. Gallbladder wall thickening and hyperenhancement, new compared to prior examination, with small gallstones in the gallbladder. There is mild intrahepatic biliary ductal  dilatation and the common bile duct is mildly dilated measuring up to 8 mm, new compared to prior examination. Motion somewhat limits assessment of the biliary tree, however appearance is suspicious for at least one tiny gallstone near the ampulla (series 2, image 17). Pancreas: No mass, inflammatory changes, or other parenchymal abnormality identified. Spleen:  Within normal limits in size and appearance. Adrenals/Urinary Tract: No masses identified. No evidence of hydronephrosis. Stomach/Bowel: Visualized portions within the abdomen are unremarkable. Vascular/Lymphatic: No pathologically enlarged lymph nodes identified. No abdominal aortic aneurysm demonstrated. Other:  None. Musculoskeletal: No suspicious bone lesions identified. IMPRESSION: 1. Gallbladder wall thickening and hyperenhancement, new compared to prior CT examination, with small gallstones in the gallbladder. Appearance is consistent with acute cholecystitis. 2. There is mild intrahepatic biliary ductal dilatation and the common bile duct is mildly dilated measuring up to 8 mm, new compared to prior examination. Motion somewhat limits assessment of the biliary tree, however, appearance is suspicious for at least one tiny gallstone near the ampulla. Consider ERCP to further evaluate. Preliminary report was provided by Dr. SJoelyn Omsat 12:07 a.m., 12/18/2020 as documented in PACS. Electronically Signed   By: AEddie CandleM.D.   On: 12/18/2020 07:29      Impression / Plan:   MTametria Ahois a 30y.o. y/o female with abdominal pain, cholecystitis and choledocholithiasis  Imaging has not shown any evidence of pancreatitis Elevation of lipase by itself is not consistent with pancreatitis alone  Her abdominal pain is due to her cholecystitis  Due to CBD stone suggested on MRCP and persistent elevation of bilirubin since presentation, ERCP indicated for evaluation of the common bile duct and stone removal  I discussed the case with Dr. WAllen Norris and ERCP is planned for today  Continue n.p.o. status  Surgery following and is planning on cholecystectomy potential tomorrow  Continue to avoid hepatotoxic drugs  Repeat CMP tomorrow morning as well  Pain control and IV fluids as necessary  Thank you for involving me in the care of this patient.      LOS: 0 days   VVirgel Manifold MD  12/18/2020, 12:16 PM

## 2020-12-19 ENCOUNTER — Inpatient Hospital Stay: Payer: Medicaid Other | Admitting: Registered Nurse

## 2020-12-19 ENCOUNTER — Encounter
Admission: EM | Disposition: A | Discharge status: Home / Self Care | Payer: Self-pay | Source: Home / Self Care | Attending: Internal Medicine

## 2020-12-19 ENCOUNTER — Encounter: Payer: Self-pay | Admitting: Gastroenterology

## 2020-12-19 DIAGNOSIS — R7989 Other specified abnormal findings of blood chemistry: Secondary | ICD-10-CM

## 2020-12-19 DIAGNOSIS — K8010 Calculus of gallbladder with chronic cholecystitis without obstruction: Secondary | ICD-10-CM

## 2020-12-19 DIAGNOSIS — K819 Cholecystitis, unspecified: Secondary | ICD-10-CM

## 2020-12-19 DIAGNOSIS — E876 Hypokalemia: Secondary | ICD-10-CM

## 2020-12-19 HISTORY — PX: CHOLECYSTECTOMY, LAPAROSCOPIC: SHX56

## 2020-12-19 LAB — COMPREHENSIVE METABOLIC PANEL
ALT: 136 U/L — ABNORMAL HIGH (ref 0–44)
AST: 44 U/L — ABNORMAL HIGH (ref 15–41)
Albumin: 3.5 g/dL (ref 3.5–5.0)
Alkaline Phosphatase: 220 U/L — ABNORMAL HIGH (ref 38–126)
Anion gap: 8 (ref 5–15)
BUN: 7 mg/dL (ref 6–20)
CO2: 23 mmol/L (ref 22–32)
Calcium: 8.8 mg/dL — ABNORMAL LOW (ref 8.9–10.3)
Chloride: 105 mmol/L (ref 98–111)
Creatinine, Ser: 0.54 mg/dL (ref 0.44–1.00)
GFR, Estimated: >60 mL/min (ref >60)
Glucose, Bld: 99 mg/dL (ref 70–99)
Potassium: 3.7 mmol/L (ref 3.5–5.1)
Sodium: 136 mmol/L (ref 135–145)
Total Bilirubin: 1.8 mg/dL — ABNORMAL HIGH (ref 0.3–1.2)
Total Protein: 6.9 g/dL (ref 6.5–8.1)

## 2020-12-19 LAB — GLUCOSE, CAPILLARY: Glucose-Capillary: 83 mg/dL (ref 70–99)

## 2020-12-19 LAB — LIPASE, BLOOD: Lipase: 23 U/L (ref 11–51)

## 2020-12-19 SURGERY — CHOLECYSTECTOMY, ROBOT-ASSISTED, LAPAROSCOPIC
Anesthesia: General

## 2020-12-19 MED ORDER — LIDOCAINE HCL (CARDIAC) PF 100 MG/5ML IV SOSY
PREFILLED_SYRINGE | INTRAVENOUS | Status: DC | PRN
  Administered 2020-12-19: 40 mg via INTRAVENOUS
  Administered 2020-12-19: 60 mg via INTRAVENOUS

## 2020-12-19 MED ORDER — FENTANYL CITRATE (PF) 100 MCG/2ML IJ SOLN
INTRAMUSCULAR | Status: AC
  Filled 2020-12-19: qty 2

## 2020-12-19 MED ORDER — FENTANYL CITRATE (PF) 100 MCG/2ML IJ SOLN
INTRAMUSCULAR | Status: DC | PRN
  Administered 2020-12-19 (×4): 25 ug via INTRAVENOUS

## 2020-12-19 MED ORDER — HYDROCODONE-ACETAMINOPHEN 5-325 MG PO TABS
1.0000 | ORAL_TABLET | ORAL | Status: DC | PRN
  Administered 2020-12-19 – 2020-12-20 (×3): 2 via ORAL
  Filled 2020-12-19 (×3): qty 2

## 2020-12-19 MED ORDER — INDOCYANINE GREEN 25 MG IV SOLR
2.5000 mg | Freq: Once | INTRAVENOUS | Status: AC
  Administered 2020-12-19: 2.5 mg via INTRAVENOUS
  Filled 2020-12-19: qty 1

## 2020-12-19 MED ORDER — DEXAMETHASONE SODIUM PHOSPHATE 10 MG/ML IJ SOLN
INTRAMUSCULAR | Status: DC | PRN
  Administered 2020-12-19: 8 mg via INTRAVENOUS

## 2020-12-19 MED ORDER — DEXAMETHASONE SODIUM PHOSPHATE 10 MG/ML IJ SOLN
INTRAMUSCULAR | Status: AC
  Filled 2020-12-19: qty 1

## 2020-12-19 MED ORDER — MIDAZOLAM HCL 2 MG/2ML IJ SOLN
INTRAMUSCULAR | Status: AC
  Filled 2020-12-19: qty 2

## 2020-12-19 MED ORDER — ROCURONIUM BROMIDE 100 MG/10ML IV SOLN
INTRAVENOUS | Status: DC | PRN
  Administered 2020-12-19: 50 mg via INTRAVENOUS

## 2020-12-19 MED ORDER — DIPHENHYDRAMINE HCL 50 MG/ML IJ SOLN
INTRAMUSCULAR | Status: AC
  Filled 2020-12-19: qty 1

## 2020-12-19 MED ORDER — FENTANYL CITRATE (PF) 100 MCG/2ML IJ SOLN
25.0000 ug | INTRAMUSCULAR | Status: DC | PRN
  Administered 2020-12-19 (×5): 25 ug via INTRAVENOUS

## 2020-12-19 MED ORDER — MORPHINE SULFATE (PF) 2 MG/ML IV SOLN
2.0000 mg | INTRAVENOUS | Status: DC | PRN
  Administered 2020-12-19: 2 mg via INTRAVENOUS
  Filled 2020-12-19: qty 1

## 2020-12-19 MED ORDER — KETAMINE HCL 50 MG/5ML IJ SOSY
PREFILLED_SYRINGE | INTRAMUSCULAR | Status: AC
  Filled 2020-12-19: qty 5

## 2020-12-19 MED ORDER — ONDANSETRON 4 MG PO TBDP: 4.0000 mg | ORAL_TABLET | Freq: Four times a day (QID) | ORAL | Status: DC | PRN

## 2020-12-19 MED ORDER — PROPOFOL 10 MG/ML IV BOLUS
INTRAVENOUS | Status: AC
  Filled 2020-12-19: qty 20

## 2020-12-19 MED ORDER — LIDOCAINE HCL (PF) 2 % IJ SOLN
INTRAMUSCULAR | Status: AC
  Filled 2020-12-19: qty 5

## 2020-12-19 MED ORDER — ROCURONIUM BROMIDE 10 MG/ML (PF) SYRINGE
PREFILLED_SYRINGE | INTRAVENOUS | Status: AC
  Filled 2020-12-19: qty 10

## 2020-12-19 MED ORDER — ONDANSETRON HCL 4 MG/2ML IJ SOLN
INTRAMUSCULAR | Status: DC | PRN
  Administered 2020-12-19: 4 mg via INTRAVENOUS

## 2020-12-19 MED ORDER — MIDAZOLAM HCL 2 MG/2ML IJ SOLN
INTRAMUSCULAR | Status: DC | PRN
  Administered 2020-12-19: 2 mg via INTRAVENOUS

## 2020-12-19 MED ORDER — GLYCOPYRROLATE 0.2 MG/ML IJ SOLN
INTRAMUSCULAR | Status: AC
  Filled 2020-12-19: qty 1

## 2020-12-19 MED ORDER — PROPOFOL 10 MG/ML IV BOLUS
INTRAVENOUS | Status: DC | PRN
  Administered 2020-12-19: 140 mg via INTRAVENOUS

## 2020-12-19 MED ORDER — ACETAMINOPHEN 10 MG/ML IV SOLN
INTRAVENOUS | Status: AC
  Filled 2020-12-19: qty 100

## 2020-12-19 MED ORDER — FENTANYL CITRATE (PF) 100 MCG/2ML IJ SOLN
INTRAMUSCULAR | Status: AC
  Administered 2020-12-19: 25 ug via INTRAVENOUS
  Filled 2020-12-19: qty 2

## 2020-12-19 MED ORDER — ONDANSETRON HCL 4 MG/2ML IJ SOLN
INTRAMUSCULAR | Status: AC
  Filled 2020-12-19: qty 2

## 2020-12-19 MED ORDER — KETAMINE HCL 10 MG/ML IJ SOLN
INTRAMUSCULAR | Status: DC | PRN
  Administered 2020-12-19: 30 mg via INTRAVENOUS
  Administered 2020-12-19: 10 mg via INTRAVENOUS

## 2020-12-19 MED ORDER — HEPARIN SODIUM (PORCINE) 5000 UNIT/ML IJ SOLN
5000.0000 [IU] | Freq: Three times a day (TID) | INTRAMUSCULAR | Status: DC
  Administered 2020-12-19 – 2020-12-20 (×2): 5000 [IU] via SUBCUTANEOUS
  Filled 2020-12-19 (×2): qty 1

## 2020-12-19 MED ORDER — DIPHENHYDRAMINE HCL 50 MG/ML IJ SOLN
INTRAMUSCULAR | Status: DC | PRN
  Administered 2020-12-19: 12.5 mg via INTRAVENOUS

## 2020-12-19 MED ORDER — ONDANSETRON HCL 4 MG/2ML IJ SOLN: 4.0000 mg | Freq: Four times a day (QID) | INTRAMUSCULAR | Status: DC | PRN

## 2020-12-19 MED ORDER — HYDROMORPHONE HCL 1 MG/ML IJ SOLN
0.2500 mg | INTRAMUSCULAR | Status: DC | PRN
  Administered 2020-12-19 (×4): 0.25 mg via INTRAVENOUS

## 2020-12-19 MED ORDER — BUPIVACAINE-EPINEPHRINE (PF) 0.25% -1:200000 IJ SOLN
INTRAMUSCULAR | Status: AC
  Filled 2020-12-19: qty 30

## 2020-12-19 MED ORDER — ACETAMINOPHEN 10 MG/ML IV SOLN
INTRAVENOUS | Status: DC | PRN
  Administered 2020-12-19: 1000 mg via INTRAVENOUS

## 2020-12-19 MED ORDER — METOPROLOL TARTRATE 5 MG/5ML IV SOLN
INTRAVENOUS | Status: DC | PRN
Start: 2020-12-19 — End: 2020-12-19
  Administered 2020-12-19 (×2): 1 mg via INTRAVENOUS

## 2020-12-19 MED ORDER — SODIUM CHLORIDE 0.9 % IV SOLN: INTRAVENOUS | Status: DC | PRN

## 2020-12-19 MED ORDER — HYDROMORPHONE HCL 1 MG/ML IJ SOLN
INTRAMUSCULAR | Status: AC
  Filled 2020-12-19: qty 1

## 2020-12-19 MED ORDER — SODIUM CHLORIDE 0.9 % IV SOLN: INTRAVENOUS | Status: DC

## 2020-12-19 MED ORDER — SUGAMMADEX SODIUM 200 MG/2ML IV SOLN
INTRAVENOUS | Status: DC | PRN
  Administered 2020-12-19: 200 mg via INTRAVENOUS

## 2020-12-19 MED ORDER — ESMOLOL HCL 100 MG/10ML IV SOLN
INTRAVENOUS | Status: DC | PRN
  Administered 2020-12-19 (×2): 20 mg via INTRAVENOUS

## 2020-12-19 MED ORDER — BUPIVACAINE-EPINEPHRINE (PF) 0.25% -1:200000 IJ SOLN
INTRAMUSCULAR | Status: DC | PRN
  Administered 2020-12-19: 30 mL

## 2020-12-19 MED ORDER — ONDANSETRON HCL 4 MG/2ML IJ SOLN: 4.0000 mg | Freq: Once | INTRAMUSCULAR | Status: DC | PRN

## 2020-12-19 MED ORDER — SEVOFLURANE IN SOLN
RESPIRATORY_TRACT | Status: AC
  Filled 2020-12-19: qty 250

## 2020-12-19 MED ORDER — ESMOLOL HCL 100 MG/10ML IV SOLN
INTRAVENOUS | Status: AC
  Filled 2020-12-19: qty 10

## 2020-12-19 SURGICAL SUPPLY — 45 items
CANISTER SUCT 1200ML W/VALVE (MISCELLANEOUS) IMPLANT
CHLORAPREP W/TINT 26 (MISCELLANEOUS) ×2 IMPLANT
CLIP VESOLOCK MED LG 6/CT (CLIP) ×2 IMPLANT
COVER TIP SHEARS 8 DVNC (MISCELLANEOUS) ×1 IMPLANT
COVER TIP SHEARS 8MM DA VINCI (MISCELLANEOUS) ×1
COVER WAND RF STERILE (DRAPES) ×2 IMPLANT
DECANTER SPIKE VIAL GLASS SM (MISCELLANEOUS) ×2 IMPLANT
DEFOGGER SCOPE WARMER CLEARIFY (MISCELLANEOUS) ×2 IMPLANT
DERMABOND ADVANCED (GAUZE/BANDAGES/DRESSINGS) ×1
DERMABOND ADVANCED .7 DNX12 (GAUZE/BANDAGES/DRESSINGS) ×1 IMPLANT
DRAPE ARM DVNC X/XI (DISPOSABLE) ×4 IMPLANT
DRAPE COLUMN DVNC XI (DISPOSABLE) ×1 IMPLANT
DRAPE DA VINCI XI ARM (DISPOSABLE) ×4
DRAPE DA VINCI XI COLUMN (DISPOSABLE) ×1
ELECT CAUTERY BLADE 6.4 (BLADE) ×2 IMPLANT
GLOVE ORTHO TXT STRL SZ7.5 (GLOVE) ×4 IMPLANT
GOWN STRL REUS W/ TWL LRG LVL3 (GOWN DISPOSABLE) ×4 IMPLANT
GOWN STRL REUS W/TWL LRG LVL3 (GOWN DISPOSABLE) ×4
GRASPER SUT TROCAR 14GX15 (MISCELLANEOUS) IMPLANT
INFUSOR MANOMETER BAG 3000ML (MISCELLANEOUS) IMPLANT
IRRIGATION STRYKERFLOW (MISCELLANEOUS) IMPLANT
IRRIGATOR STRYKERFLOW (MISCELLANEOUS)
IRRIGATOR SUCT 8 DISP DVNC XI (IRRIGATION / IRRIGATOR) IMPLANT
IRRIGATOR SUCTION 8MM XI DISP (IRRIGATION / IRRIGATOR)
IV NS IRRIG 3000ML ARTHROMATIC (IV SOLUTION) IMPLANT
KIT PINK PAD W/HEAD ARE REST (MISCELLANEOUS) ×2
KIT PINK PAD W/HEAD ARM REST (MISCELLANEOUS) ×1 IMPLANT
KIT TURNOVER KIT A (KITS) ×2 IMPLANT
LABEL OR SOLS (LABEL) ×2 IMPLANT
MANIFOLD NEPTUNE II (INSTRUMENTS) ×2 IMPLANT
NEEDLE HYPO 22GX1.5 SAFETY (NEEDLE) ×2 IMPLANT
NEEDLE INSUFFLATION 14GA 120MM (NEEDLE) IMPLANT
NS IRRIG 500ML POUR BTL (IV SOLUTION) ×2 IMPLANT
PACK LAP CHOLECYSTECTOMY (MISCELLANEOUS) ×2 IMPLANT
PENCIL ELECTRO HAND CTR (MISCELLANEOUS) ×2 IMPLANT
POUCH SPECIMEN RETRIEVAL 10MM (ENDOMECHANICALS) ×2 IMPLANT
SEAL CANN UNIV 5-8 DVNC XI (MISCELLANEOUS) ×4 IMPLANT
SEAL XI 5MM-8MM UNIVERSAL (MISCELLANEOUS) ×4
SET TUBE SMOKE EVAC HIGH FLOW (TUBING) ×2 IMPLANT
SOLUTION ELECTROLUBE (MISCELLANEOUS) ×2 IMPLANT
SUT MNCRL 4-0 (SUTURE) ×1
SUT MNCRL 4-0 27XMFL (SUTURE) ×1
SUT VICRYL 0 AB UR-6 (SUTURE) ×2 IMPLANT
SUTURE MNCRL 4-0 27XMF (SUTURE) ×1 IMPLANT
TROCAR Z-THREAD FIOS 11X100 BL (TROCAR) ×2 IMPLANT

## 2020-12-19 NOTE — Progress Notes (Signed)
There was an order for discharge. Dr Claudine Mouton informed that the patient required IV pain meds and is concerned that her pain might be unmanageable at home with po pain med. He said the patient could stay the night. Discharge cancelled

## 2020-12-19 NOTE — Progress Notes (Signed)
[facility] SURGICAL PROGRESS NOTE  Hospital Day(s): 1.   Post op day(s): 1 Day Post-Op.   Interval History:  Patient seen and examined No acute events or new complaints overnight.  She underwent ERCP yesterday (02/17) with Dr Servando Snare Patient reports she is feeling much better this morning She still has some abdominal soreness but this is markedly improved compared to yesterday No fever, chills, nausea, emesis  Previous hyperbilirubinemia improving; now 1.8 Lipase has normalized at 28 Previous hypokalemia has resolved She is NPO with plans for laparoscopic cholecystectomy this afternoon She continues on Zosyn   Vital signs in last 24 hours: [min-max] current  Temp:  [97.4 F (36.3 C)-99 F (37.2 C)] 98.3 F (36.8 C) (02/18 0459) Pulse Rate:  [45-71] 52 (02/18 0505) Resp:  [16-20] 18 (02/18 0459) BP: (119-146)/(66-92) 136/66 (02/18 0459) SpO2:  [95 %-100 %] 97 % (02/18 0459) Weight:  [63.5 kg] 63.5 kg (02/17 1543)     Height: 4\' 11"  (149.9 cm) Weight: 63.5 kg BMI (Calculated): 28.26   Intake/Output last 2 shifts:  02/17 0701 - 02/18 0700 In: 958.6 [I.V.:863.8; IV Piggyback:94.8] Out: -    Physical Exam:  Constitutional: alert, cooperative and no distress  Respiratory: breathing non-labored at rest  Cardiovascular: regular rate and sinus rhythm  Gastrointestinal: soft, mild (if any) RUQ tenderness, and non-distended, no rebound/guarding Integumentary: Warm, dry, no juandice   Labs:  CBC Latest Ref Rng & Units 12/18/2020 12/17/2020 12/15/2020  WBC 4.0 - 10.5 K/uL 5.1 5.9 12.3(H)  Hemoglobin 12.0 - 15.0 g/dL 11.6(L) 12.0 12.7  Hematocrit 36.0 - 46.0 % 34.6(L) 36.5 38.4  Platelets 150 - 400 K/uL 242 262 295   CMP Latest Ref Rng & Units 12/19/2020 12/18/2020 12/17/2020  Glucose 70 - 99 mg/dL 99 99 12/19/2020)  BUN 6 - 20 mg/dL 7 932(T) 6  Creatinine <5(T - 1.00 mg/dL 7.32 2.02) 5.42(H  Sodium 135 - 145 mmol/L 136 137 136  Potassium 3.5 - 5.1 mmol/L 3.7 3.2(L) 3.4(L)   Chloride 98 - 111 mmol/L 105 106 105  CO2 22 - 32 mmol/L 23 24 23   Calcium 8.9 - 10.3 mg/dL 0.62) ) 3.7(S)  Total Protein 6.5 - 8.1 g/dL 6.9 6.5 7.5  Total Bilirubin 0.3 - 1.2 mg/dL 2.8(B) 1.5(V) 7.6(H)  Alkaline Phos 38 - 126 U/L 220(H) 186(H) 219(H)  AST 15 - 41 U/L 44(H) 92(H) 130(H)  ALT 0 - 44 U/L 136(H) 165(H) 196(H)     Imaging studies: No new pertinent imaging studies   Assessment/Plan: (ICD-10's: K85.10) 30 y.o. female admitted with gallstone pancreatitis s/p ERCP on 02/17 and possible concomitant cholecystitis given her known history of cholelithiasis              - Will plan for robotic assisted laparoscopic cholecystectomy today (02/18) with Dr 3/17 pending OR/Anesthesia availability  - All risks, benefits, and alternatives to above procedure(s) were discussed with the patient, all of her questions were answered to her expressed satisfaction, patient expresses she wishes to proceed, and informed consent was obtained.  - Appreciate GI assistance with ERCP  - NPO             - Continue IVF resuscitation             - Continue IV Abx (Zosyn)              - Monitor abdominal examination             - Pain control prn; antiemetics prn             -  Further management per primary service; we will follow   All of the above findings and recommendations were discussed with the patient, and all of patient's questions were answered to her expressed satisfaction.  -- Lynden Oxford, PA-C  [facility] 12/19/2020, 7:11 AM [phone] M-F: 7am - 4pm

## 2020-12-19 NOTE — Progress Notes (Signed)
Patient ID: Kathy Benton, female   DOB: [birth year], 30 y.o.   MRN: [id number] [facility] PROGRESS NOTE  Kathy Benton OEH:[id number] DOB: October 28, 1991 DOA: 12/17/2020 PCP: Department, [facility]  HPI/Subjective: Patient seen this morning and she was feeling better than yesterday.  Still had some soreness in the abdomen but not severe is better.  Objective: Vitals:   12/19/20 1012 12/19/20 1047  BP: 117/67 136/68  Pulse: (!) 48 (!) 54  Resp: 16 16  Temp: 98.5 F (36.9 C) 98.4 F (36.9 C)  SpO2: 99% 99%    Intake/Output Summary (Last 24 hours) at 12/19/2020 1130 Last data filed at 12/19/2020 0342 Gross per 24 hour  Intake 958.56 ml  Output --  Net 958.56 ml   Filed Weights   12/17/20 2105 12/18/20 1543  Weight: 63.5 kg 63.5 kg    ROS: Review of Systems  Respiratory: Negative for shortness of breath.   Cardiovascular: Negative for chest pain.  Gastrointestinal: Positive for abdominal pain and nausea. Negative for vomiting.   Exam: Physical Exam HENT:     Head: Normocephalic.     Mouth/Throat:     Pharynx: No oropharyngeal exudate.  Eyes:     General: Lids are normal.     Conjunctiva/sclera: Conjunctivae normal.  Cardiovascular:     Rate and Rhythm: Normal rate and regular rhythm.     Heart sounds: Normal heart sounds, S1 normal and S2 normal.  Pulmonary:     Breath sounds: Normal breath sounds. No decreased breath sounds, wheezing, rhonchi or rales.  Abdominal:     Palpations: Abdomen is soft.     Tenderness: There is abdominal tenderness in the right upper quadrant and epigastric area.  Musculoskeletal:     Right ankle: No swelling.     Left ankle: No swelling.  Skin:    General: Skin is warm.     Findings: No rash.  Neurological:     Mental Status: She is alert and oriented to person, place, and time.       Data Reviewed: Basic Metabolic Panel: Recent Labs  Lab 12/15/20 0218 12/17/20 2110 12/18/20 0437 12/19/20 0406  NA 135 136  137 136  K 3.5 3.4* 3.2* 3.7  CL 103 105 106 105  CO2 23 23 24 23   GLUCOSE 117* 115* 99 99  BUN 15 6 <5* 7  CREATININE 0.64 0.53 0.42* 0.54  CALCIUM 9.1 8.8* 8.4* 8.8*   Liver Function Tests: Recent Labs  Lab 12/15/20 0218 12/17/20 2110 12/18/20 0437 12/19/20 0406  AST 32 130* 92* 44*  ALT 27 196* 165* 136*  ALKPHOS 110 219* 186* 220*  BILITOT 1.1 3.8* 3.5* 1.8*  PROT 7.8 7.5 6.5 6.9  ALBUMIN 4.5 3.9 3.4* 3.5   Recent Labs  Lab 12/15/20 0218 12/17/20 2110 12/18/20 0437 12/19/20 0406  LIPASE 40 400* 98* 23   CBC: Recent Labs  Lab 12/15/20 0218 12/17/20 2110 12/18/20 0437  WBC 12.3* 5.9 5.1  HGB 12.7 12.0 11.6*  HCT 38.4 36.5 34.6*  MCV 90.6 89.7 89.6  PLT 295 262 242    CBG: Recent Labs  Lab 12/19/20 1049  GLUCAP 83    Recent Results (from the past 240 hour(s))  Resp Panel by RT-PCR (Flu A&B, Covid) Nasopharyngeal Swab     Status: None   Collection Time: 12/18/20 12:43 AM   Specimen: Nasopharyngeal Swab; Nasopharyngeal(NP) swabs in vial transport medium  Result Value Ref Range Status   SARS Coronavirus 2 by RT PCR NEGATIVE NEGATIVE Final  Comment: (NOTE) SARS-CoV-2 target nucleic acids are NOT DETECTED.  The SARS-CoV-2 RNA is generally detectable in upper respiratory specimens during the acute phase of infection. The lowest concentration of SARS-CoV-2 viral copies this assay can detect is 138 copies/mL. A negative result does not preclude SARS-Cov-2 infection and should not be used as the sole basis for treatment or other patient management decisions. A negative result may occur with  improper specimen collection/handling, submission of specimen other than nasopharyngeal swab, presence of viral mutation(s) within the areas targeted by this assay, and inadequate number of viral copies(<138 copies/mL). A negative result must be combined with clinical observations, patient history, and epidemiological information. The expected result is  Negative.  Fact Sheet for Patients:  BloggerCourse.com  Fact Sheet for Healthcare Providers:  SeriousBroker.it  This test is no t yet approved or cleared by the [place] FDA and  has been authorized for detection and/or diagnosis of SARS-CoV-2 by FDA under an Emergency Use Authorization (EUA). This EUA will remain  in effect (meaning this test can be used) for the duration of the COVID-19 declaration under Section 564(b)(1) of the Act, 21 U.S.C.section 360bbb-3(b)(1), unless the authorization is terminated  or revoked sooner.       Influenza A by PCR NEGATIVE NEGATIVE Final   Influenza B by PCR NEGATIVE NEGATIVE Final    Comment: (NOTE) The Xpert Xpress SARS-CoV-2/FLU/RSV plus assay is intended as an aid in the diagnosis of influenza from Nasopharyngeal swab specimens and should not be used as a sole basis for treatment. Nasal washings and aspirates are unacceptable for Xpert Xpress SARS-CoV-2/FLU/RSV testing.  Fact Sheet for Patients: BloggerCourse.com  Fact Sheet for Healthcare Providers: SeriousBroker.it  This test is not yet approved or cleared by the [place] FDA and has been authorized for detection and/or diagnosis of SARS-CoV-2 by FDA under an Emergency Use Authorization (EUA). This EUA will remain in effect (meaning this test can be used) for the duration of the COVID-19 declaration under Section 564(b)(1) of the Act, 21 U.S.C. section 360bbb-3(b)(1), unless the authorization is terminated or revoked.  Performed at [facility], [address] [place], [place] [address]      Studies: MR 3D Recon At Scanner  Result Date: 12/18/2020 CLINICAL DATA:  Abdominal pain, cholelithiasis EXAM: MRI ABDOMEN WITHOUT AND WITH CONTRAST (INCLUDING MRCP) TECHNIQUE: Multiplanar multisequence MR imaging of the abdomen was performed both before and after the  administration of intravenous contrast. Heavily T2-weighted images of the biliary and pancreatic ducts were obtained, and three-dimensional MRCP images were rendered by post processing. CONTRAST:  56mL GADAVIST GADOBUTROL 1 MMOL/ML IV SOLN COMPARISON:  CT abdomen pelvis, 12/15/2020 FINDINGS: Lower chest: No acute findings. Hepatobiliary: No mass or other parenchymal abnormality identified. Gallbladder wall thickening and hyperenhancement, new compared to prior examination, with small gallstones in the gallbladder. There is mild intrahepatic biliary ductal dilatation and the common bile duct is mildly dilated measuring up to 8 mm, new compared to prior examination. Motion somewhat limits assessment of the biliary tree, however appearance is suspicious for at least one tiny gallstone near the ampulla (series 2, image 17). Pancreas: No mass, inflammatory changes, or other parenchymal abnormality identified. Spleen:  Within normal limits in size and appearance. Adrenals/Urinary Tract: No masses identified. No evidence of hydronephrosis. Stomach/Bowel: Visualized portions within the abdomen are unremarkable. Vascular/Lymphatic: No pathologically enlarged lymph nodes identified. No abdominal aortic aneurysm demonstrated. Other:  None. Musculoskeletal: No suspicious bone lesions identified. IMPRESSION: 1. Gallbladder wall thickening and hyperenhancement, new compared  to prior CT examination, with small gallstones in the gallbladder. Appearance is consistent with acute cholecystitis. 2. There is mild intrahepatic biliary ductal dilatation and the common bile duct is mildly dilated measuring up to 8 mm, new compared to prior examination. Motion somewhat limits assessment of the biliary tree, however, appearance is suspicious for at least one tiny gallstone near the ampulla. Consider ERCP to further evaluate. Preliminary report was provided by Dr. Marisue Humble at 12:07 a.m., 12/18/2020 as documented in PACS. Electronically Signed    By: Lauralyn Primes M.D.   On: 12/18/2020 07:29   DG C-Arm 1-60 Min-No Report  Result Date: 12/18/2020 Fluoroscopy was utilized by the requesting physician.  No radiographic interpretation.   MR ABDOMEN MRCP W WO CONTAST  Result Date: 12/18/2020 CLINICAL DATA:  Abdominal pain, cholelithiasis EXAM: MRI ABDOMEN WITHOUT AND WITH CONTRAST (INCLUDING MRCP) TECHNIQUE: Multiplanar multisequence MR imaging of the abdomen was performed both before and after the administration of intravenous contrast. Heavily T2-weighted images of the biliary and pancreatic ducts were obtained, and three-dimensional MRCP images were rendered by post processing. CONTRAST:  85mL GADAVIST GADOBUTROL 1 MMOL/ML IV SOLN COMPARISON:  CT abdomen pelvis, 12/15/2020 FINDINGS: Lower chest: No acute findings. Hepatobiliary: No mass or other parenchymal abnormality identified. Gallbladder wall thickening and hyperenhancement, new compared to prior examination, with small gallstones in the gallbladder. There is mild intrahepatic biliary ductal dilatation and the common bile duct is mildly dilated measuring up to 8 mm, new compared to prior examination. Motion somewhat limits assessment of the biliary tree, however appearance is suspicious for at least one tiny gallstone near the ampulla (series 2, image 17). Pancreas: No mass, inflammatory changes, or other parenchymal abnormality identified. Spleen:  Within normal limits in size and appearance. Adrenals/Urinary Tract: No masses identified. No evidence of hydronephrosis. Stomach/Bowel: Visualized portions within the abdomen are unremarkable. Vascular/Lymphatic: No pathologically enlarged lymph nodes identified. No abdominal aortic aneurysm demonstrated. Other:  None. Musculoskeletal: No suspicious bone lesions identified. IMPRESSION: 1. Gallbladder wall thickening and hyperenhancement, new compared to prior CT examination, with small gallstones in the gallbladder. Appearance is consistent with acute  cholecystitis. 2. There is mild intrahepatic biliary ductal dilatation and the common bile duct is mildly dilated measuring up to 8 mm, new compared to prior examination. Motion somewhat limits assessment of the biliary tree, however, appearance is suspicious for at least one tiny gallstone near the ampulla. Consider ERCP to further evaluate. Preliminary report was provided by Dr. Marisue Humble at 12:07 a.m., 12/18/2020 as documented in PACS. Electronically Signed   By: Lauralyn Primes M.D.   On: 12/18/2020 07:29    Scheduled Meds: . [MAR Hold] indomethacin  100 mg Rectal Once   Continuous Infusions: . 0.9 % NaCl with KCl 20 mEq / L 75 mL/hr at 12/19/20 0342  . [MAR Hold] piperacillin-tazobactam (ZOSYN)  IV 3.375 g (12/19/20 0502)    Assessment/Plan:  1. Acute gallstone pancreatitis, choledocholithiasis and cholecystitis.  MRCP showed a potential stone in the biliary tree and gallbladder wall thickening.  Patient had a successful ERCP yesterday with removal stone in the biliary tree and biliary sphincterotomy.  The patient's bilirubin has trended better.  The patient for cholecystectomy today.  Pancreatic enzymes trended better 2. Elevated liver function test secondary to gallstone pancreatitis and choledocholithiasis 3. Hypokalemia with potassium through IV 4. Recent pregnancy with vaginal delivery on 11/05/2020.  Also had Covid infection at that time.  No respiratory symptoms at that time.        Code Status:  Code Status Orders  (From admission, onward)         Start     Ordered   12/18/20 0140  Full code  Continuous        12/18/20 0139        Code Status History    Date Active Date Inactive Code Status Order ID Comments User Context   11/05/2020 1848 11/06/2020 2132 Full Code [id number]  Schermerhorn, Ihor Austinhomas J, MD Inpatient   11/05/2020 1652 11/05/2020 1848 Full Code [id number]  Haroldine LawsOxley, Jennifer, CNM Inpatient   11/04/2020 1436 11/04/2020 2039 Full Code [id number]  Caren HazyMaldonado, Kamila A, RN  Inpatient   06/24/2018 2254 06/26/2018 1956 Full Code [id number]  Randa NgoMcVey, Rebecca A, CNM Inpatient   06/24/2018 1611 06/24/2018 2254 Full Code [id number]  Randa NgoMcVey, Rebecca A, CNM Inpatient   06/19/2018 0933 06/19/2018 1438 Full Code [id number]  Christeen DouglasBeasley, Bethany, MD Inpatient   Advance Care Planning Activity      Disposition Plan: Status is: Inpatient  Dispo: The patient is from: Home              Anticipated d/c is to: Home              Anticipated d/c date is: If laparoscopic cholecystectomy goes well potentially tomorrow afternoon Home              Patient currently in the OR for cholecystectomy   Difficult to place patient. no  Time spent: 27 minutes  Kathy Terhaar [employer]  [facility]

## 2020-12-19 NOTE — Anesthesia Preprocedure Evaluation (Signed)
Anesthesia Evaluation  Patient identified by MRN, date of birth, ID band Patient awake    Reviewed: Allergy & Precautions, NPO status , Patient's Chart, lab work & pertinent test results  History of Anesthesia Complications Negative for: history of anesthetic complications  Airway Mallampati: II       Dental   Pulmonary neg sleep apnea, neg COPD, Not current smoker,           Cardiovascular hypertension, Pt. on medications (-) Past MI and (-) CHF (-) dysrhythmias (-) Valvular Problems/Murmurs     Neuro/Psych neg Seizures    GI/Hepatic Neg liver ROS, neg GERD  ,  Endo/Other  diabetes, Type 2, Oral Hypoglycemic Agents  Renal/GU negative Renal ROS     Musculoskeletal   Abdominal   Peds  Hematology   Anesthesia Other Findings   Reproductive/Obstetrics                             Anesthesia Physical Anesthesia Plan  ASA: II  Anesthesia Plan: General   Post-op Pain Management:    Induction: Intravenous  PONV Risk Score and Plan: 3 and Ondansetron, Dexamethasone and Treatment may vary due to age or medical condition  Airway Management Planned: Oral ETT  Additional Equipment:   Intra-op Plan:   Post-operative Plan:   Informed Consent: I have reviewed the patients History and Physical, chart, labs and discussed the procedure including the risks, benefits and alternatives for the proposed anesthesia with the patient or authorized representative who has indicated his/her understanding and acceptance.       Plan Discussed with:   Anesthesia Plan Comments:         Anesthesia Quick Evaluation

## 2020-12-19 NOTE — Op Note (Signed)
Robotic cholecystectomy  Pre-operative Diagnosis: Chronic calculus cholecystitis, subacute cholecystitis, with recent choledocholithiasis.  Post-operative Diagnosis:  Same.  Procedure: Robotic assisted laparoscopic cholecystectomy.  Surgeon: Campbell Lerner, M.D., FACS  Anesthesia: General. with endotracheal tube  Findings: Marked edematous gallbladder, distended with excellent ICG visualization.  Estimated Blood Loss: 10 mL         Drains: None         Specimens: Gallbladder           Complications: none  Procedure Details  The patient was seen again in the Holding Room.  1.25-2.5 mg dose of ICG was administered intravenously.  The benefits, complications, treatment options, risks and expected outcomes were discussed with the patient. The likelihood of improving the patient's symptoms with return to their baseline status is good.  The patient and/or family concurred with the proposed plan, giving informed consent, again alternatives reviewed.  The patient was taken to Operating Room, identified, and the procedure verified as robotic assisted laparoscopic cholecystectomy.  Prior to the induction of general anesthesia, antibiotic prophylaxis was administered. VTE prophylaxis was in place. General endotracheal anesthesia was then administered and tolerated well. The patient was positioned in the supine position.  After the induction, the abdomen was prepped with Chloraprep and draped in the sterile fashion.  A Time Out was held and the above information confirmed. Right infra-umbilical local infiltration with quarter percent Marcaine with epinephrine is utilized.  Made a 12 mm incision on the left periumbilical site, I advanced an optical 7mm port under direct visualization into the peritoneal cavity.  Once the peritoneum was penetrated, insufflation was transferred.  The trocar was then advanced into the abdominal cavity under direct visualization. Pneumoperitoneum was then continued with  CO2 at 14 mmHg or less and tolerated well without any adverse changes in the patient's vital signs.  Two 8.5-mm ports were placed in the left lower quadrant and laterally, and one to the right lower quadrant, all under direct vision. All skin incisions  were infiltrated with a local anesthetic agent before making the incision and placing the trocars.  The patient was positioned  in reverse Trendelenburg, tilted the patient's left side down.  Da Vinci XI robot was then positioned on to the patient's left side, and docked.  The gallbladder was identified, the fundus grasped via the arm 4 Prograsp and retracted cephalad. Adhesions were lysed with scissors and cautery.  The infundibulum was identified grasped and retracted laterally, exposing the peritoneum overlying the triangle of Calot. This was then opened and dissected using cautery & scissors. An extended critical view of the cystic duct and cystic artery was obtained, aided by the ICG via FireFly which enabled ready visualization of the ductal anatomy.    The cystic duct was clearly identified and dissected to isolation.   Artery well isolated and clipped, and the cystic duct was triple clipped and divided with scissors, leaving two on the remaining stump.  The specimen side of the artery is sealed with bipolar and divided with monopolar scissors.   The gallbladder was taken from the gallbladder fossa in a retrograde fashion with the electrocautery. The gallbladder was removed and placed in an Endocatch bag.  The liver bed is inspected. Hemostasis was confirmed.  The robot was undocked and moved away from the operative field. The gallbladder and Endocatch sac were then removed through the infraumbilical port site.   Inspection of the right upper quadrant was performed. No bleeding, bile duct injury or leak, or bowel injury was noted.  The infra-umbilical port site fascia was closed with interrumpted 0 Vicryl sutures using PMI/cone under direct  visualization. Pneumoperitoneum was released and ports removed.  4-0 subcuticular Monocryl was used to close the skin. Dermabond was  applied.  The patient was then extubated and brought to the recovery room in stable condition. Sponge, lap, and needle counts were correct at closure and at the conclusion of the case.               Campbell Lerner, M.D., [facility] 12/19/2020 1:09 PM

## 2020-12-19 NOTE — Transfer of Care (Signed)
Immediate Anesthesia Transfer of Care Note  Patient: Kathy Benton  Procedure(s) Performed: XI ROBOTIC ASSISTED LAPAROSCOPIC CHOLECYSTECTOMY (N/A )  Patient Location: PACU  Anesthesia Type:General  Level of Consciousness: drowsy  Airway & Oxygen Therapy: Patient Spontanous Breathing and Patient connected to face mask oxygen  Post-op Assessment: Report given to RN and Post -op Vital signs reviewed and stable  Post vital signs: Reviewed and stable  Last Vitals:  Vitals Value Taken Time  BP 135/86 12/19/20 1315  Temp    Pulse 80 12/19/20 1315  Resp 14 12/19/20 1315  SpO2 100 % 12/19/20 1315  Vitals shown include unvalidated device data.  Last Pain:  Vitals:   12/19/20 1047  TempSrc: Temporal  PainSc: 2          Complications: No complications documented.

## 2020-12-19 NOTE — Anesthesia Procedure Notes (Signed)
Procedure Name: Intubation Date/Time: 12/19/2020 12:01 PM Performed by: Lynden Oxford, CRNA Pre-anesthesia Checklist: Patient identified, Emergency Drugs available, Suction available and Patient being monitored Patient Re-evaluated:Patient Re-evaluated prior to induction Oxygen Delivery Method: Circle system utilized Preoxygenation: Pre-oxygenation with 100% oxygen Induction Type: IV induction Ventilation: Mask ventilation without difficulty Laryngoscope Size: McGraph and 3 Grade View: Grade I Tube type: Oral Tube size: 7.0 mm Number of attempts: 1 Airway Equipment and Method: Stylet and Video-laryngoscopy Placement Confirmation: ETT inserted through vocal cords under direct vision,  positive ETCO2 and breath sounds checked- equal and bilateral Secured at: 21 cm Tube secured with: Tape Dental Injury: Teeth and Oropharynx as per pre-operative assessment

## 2020-12-19 NOTE — Progress Notes (Signed)
Kathy Bouillon, MD [address], [address], [place], [place], [address] [address], [place], [place], [address] Phone: [phone]  Fax: [phone]   Subjective: Patient evaluated postoperatively after cholecystectomy today.  She is recovering from her surgery and nurses at bedside.  Patient has received pain medication as she has abdominal pain after her cholecystectomy.  She specifically denies having any abdominal pain after her ERCP done yesterday.  No nausea vomiting or fever chills at this time   Objective: Exam: Vital signs in last 24 hours: Vitals:   12/19/20 1450 12/19/20 1455 12/19/20 1515 12/19/20 1524  BP:      Pulse: 60 (!) 55 62 (!) 59  Resp: 14 16 12    Temp:    (!) 97 F (36.1 C)  TempSrc:      SpO2: 93% 94% 96% 94%  Weight:      Height:       Weight change: -0.004 kg  Intake/Output Summary (Last 24 hours) at 12/19/2020 1526 Last data filed at 12/19/2020 1315 Gross per 24 hour  Intake 1858.56 ml  Output 10 ml  Net 1848.56 ml    General: No acute distress, AAO x3 Abd: Soft, tender to palpation diffusely, No HSM Skin: Warm, no rashes Neck: Supple, Trachea midline   Lab Results: Lab Results  Component Value Date   WBC 5.1 12/18/2020   HGB 11.6 (L) 12/18/2020   HCT 34.6 (L) 12/18/2020   MCV 89.6 12/18/2020   PLT 242 12/18/2020   Micro Results: Recent Results (from the past 240 hour(s))  Resp Panel by RT-PCR (Flu A&B, Covid) Nasopharyngeal Swab     Status: None   Collection Time: 12/18/20 12:43 AM   Specimen: Nasopharyngeal Swab; Nasopharyngeal(NP) swabs in vial transport medium  Result Value Ref Range Status   SARS Coronavirus 2 by RT PCR NEGATIVE NEGATIVE Final    Comment: (NOTE) SARS-CoV-2 target nucleic acids are NOT DETECTED.  The SARS-CoV-2 RNA is generally detectable in upper respiratory specimens during the acute phase of infection. The lowest concentration of SARS-CoV-2 viral copies this assay can detect is 138  copies/mL. A negative result does not preclude SARS-Cov-2 infection and should not be used as the sole basis for treatment or other patient management decisions. A negative result may occur with  improper specimen collection/handling, submission of specimen other than nasopharyngeal swab, presence of viral mutation(s) within the areas targeted by this assay, and inadequate number of viral copies(<138 copies/mL). A negative result must be combined with clinical observations, patient history, and epidemiological information. The expected result is Negative.  Fact Sheet for Patients:  12/20/20  Fact Sheet for Healthcare Providers:  BloggerCourse.com  This test is no t yet approved or cleared by the [employer] FDA and  has been authorized for detection and/or diagnosis of SARS-CoV-2 by FDA under an Emergency Use Authorization (EUA). This EUA will remain  in effect (meaning this test can be used) for the duration of the COVID-19 declaration under Section 564(b)(1) of the Act, 21 U.S.C.section 360bbb-3(b)(1), unless the authorization is terminated  or revoked sooner.       Influenza A by PCR NEGATIVE NEGATIVE Final   Influenza B by PCR NEGATIVE NEGATIVE Final    Comment: (NOTE) The Xpert Xpress SARS-CoV-2/FLU/RSV plus assay is intended as an aid in the diagnosis of influenza from Nasopharyngeal swab specimens and should not be used as a sole basis for treatment. Nasal washings and aspirates are unacceptable for Xpert Xpress SARS-CoV-2/FLU/RSV testing.  Fact Sheet  for Patients: BloggerCourse.com  Fact Sheet for Healthcare Providers: [employer]ious[employer]ker[employer]it  This test is not yet approved or cleared by the [place] FDA and has been authorized for detection and/or diagnosis of SARS-CoV-2 by FDA under an Emergency Use Authorization (EUA). This EUA will remain in effect (meaning  this test can be used) for the duration of the COVID-19 declaration under Section 564(b)(1) of the Act, 21 U.S.C. section 360bbb-3(b)(1), unless the authorization is terminated or revoked.  Performed at [facility], [address] [place], [place] [address]    Studies/Results: MR 3D Recon At Scanner  Result Date: 12/18/2020 CLINICAL DATA:  Abdominal pain, cholelithiasis EXAM: MRI ABDOMEN WITHOUT AND WITH CONTRAST (INCLUDING MRCP) TECHNIQUE: Multiplanar multisequence MR imaging of the abdomen was performed both before and after the administration of intravenous contrast. Heavily T2-weighted images of the biliary and pancreatic ducts were obtained, and three-dimensional MRCP images were rendered by post processing. CONTRAST:  102mL GADAVIST GADOBUTROL 1 MMOL/ML IV SOLN COMPARISON:  CT abdomen pelvis, 12/15/2020 FINDINGS: Lower chest: No acute findings. Hepatobiliary: No mass or other parenchymal abnormality identified. Gallbladder wall thickening and hyperenhancement, new compared to prior examination, with small gallstones in the gallbladder. There is mild intrahepatic biliary ductal dilatation and the common bile duct is mildly dilated measuring up to 8 mm, new compared to prior examination. Motion somewhat limits assessment of the biliary tree, however appearance is suspicious for at least one tiny gallstone near the ampulla (series 2, image 17). Pancreas: No mass, inflammatory changes, or other parenchymal abnormality identified. Spleen:  Within normal limits in size and appearance. Adrenals/Urinary Tract: No masses identified. No evidence of hydronephrosis. Stomach/Bowel: Visualized portions within the abdomen are unremarkable. Vascular/Lymphatic: No pathologically enlarged lymph nodes identified. No abdominal aortic aneurysm demonstrated. Other:  None. Musculoskeletal: No suspicious bone lesions identified. IMPRESSION: 1. Gallbladder wall thickening and hyperenhancement, new compared to  prior CT examination, with small gallstones in the gallbladder. Appearance is consistent with acute cholecystitis. 2. There is mild intrahepatic biliary ductal dilatation and the common bile duct is mildly dilated measuring up to 8 mm, new compared to prior examination. Motion somewhat limits assessment of the biliary tree, however, appearance is suspicious for at least one tiny gallstone near the ampulla. Consider ERCP to further evaluate. Preliminary report was provided by Dr. Marisue Humble at 12:07 a.m., 12/18/2020 as documented in PACS. Electronically Signed   By: Lauralyn Primes M.D.   On: 12/18/2020 07:29   DG C-Arm 1-60 Min-No Report  Result Date: 12/18/2020 Fluoroscopy was utilized by the requesting physician.  No radiographic interpretation.   MR ABDOMEN MRCP W WO CONTAST  Result Date: 12/18/2020 CLINICAL DATA:  Abdominal pain, cholelithiasis EXAM: MRI ABDOMEN WITHOUT AND WITH CONTRAST (INCLUDING MRCP) TECHNIQUE: Multiplanar multisequence MR imaging of the abdomen was performed both before and after the administration of intravenous contrast. Heavily T2-weighted images of the biliary and pancreatic ducts were obtained, and three-dimensional MRCP images were rendered by post processing. CONTRAST:  52mL GADAVIST GADOBUTROL 1 MMOL/ML IV SOLN COMPARISON:  CT abdomen pelvis, 12/15/2020 FINDINGS: Lower chest: No acute findings. Hepatobiliary: No mass or other parenchymal abnormality identified. Gallbladder wall thickening and hyperenhancement, new compared to prior examination, with small gallstones in the gallbladder. There is mild intrahepatic biliary ductal dilatation and the common bile duct is mildly dilated measuring up to 8 mm, new compared to prior examination. Motion somewhat limits assessment of the biliary tree, however appearance is suspicious for at least one tiny gallstone near the ampulla (series 2, image  17). Pancreas: No mass, inflammatory changes, or other parenchymal abnormality identified.  Spleen:  Within normal limits in size and appearance. Adrenals/Urinary Tract: No masses identified. No evidence of hydronephrosis. Stomach/Bowel: Visualized portions within the abdomen are unremarkable. Vascular/Lymphatic: No pathologically enlarged lymph nodes identified. No abdominal aortic aneurysm demonstrated. Other:  None. Musculoskeletal: No suspicious bone lesions identified. IMPRESSION: 1. Gallbladder wall thickening and hyperenhancement, new compared to prior CT examination, with small gallstones in the gallbladder. Appearance is consistent with acute cholecystitis. 2. There is mild intrahepatic biliary ductal dilatation and the common bile duct is mildly dilated measuring up to 8 mm, new compared to prior examination. Motion somewhat limits assessment of the biliary tree, however, appearance is suspicious for at least one tiny gallstone near the ampulla. Consider ERCP to further evaluate. Preliminary report was provided by Dr. Marisue Humble at 12:07 a.m., 12/18/2020 as documented in PACS. Electronically Signed   By: Lauralyn Primes M.D.   On: 12/18/2020 07:29   Medications:  Scheduled Meds: . fentaNYL      . HYDROmorphone      . [MAR Hold] indomethacin  100 mg Rectal Once   Continuous Infusions: . 0.9 % NaCl with KCl 20 mEq / L 75 mL/hr at 12/19/20 0342  . [MAR Hold] piperacillin-tazobactam (ZOSYN)  IV 3.375 g (12/19/20 0502)   PRN Meds:.fentaNYL (SUBLIMAZE) injection, [MAR Hold] HYDROcodone-acetaminophen, HYDROmorphone (DILAUDID) injection, [MAR Hold]  HYDROmorphone (DILAUDID) injection, [MAR Hold] ibuprofen, [MAR Hold] ondansetron **OR** [MAR Hold] ondansetron (ZOFRAN) IV, ondansetron (ZOFRAN) IV, [MAR Hold] polyethylene glycol, [MAR Hold] traZODone   Assessment: Cholecystitis Choledocholithiasis  Plan: Bilirubin improved today after ERCP with stone removal yesterday Patient is status post cholecystectomy Continue monitoring liver enzymes If liver enzymes worsen and do not improve as  expected, please page GI  GI service will sign off at this time Further care as per surgery team   LOS: 1 day   Kathy Bouillon, MD 12/19/2020, 3:26 PM

## 2020-12-20 LAB — COMPREHENSIVE METABOLIC PANEL
ALT: 97 U/L — ABNORMAL HIGH (ref 0–44)
AST: 36 U/L (ref 15–41)
Albumin: 3.2 g/dL — ABNORMAL LOW (ref 3.5–5.0)
Alkaline Phosphatase: 158 U/L — ABNORMAL HIGH (ref 38–126)
Anion gap: 6 (ref 5–15)
BUN: 6 mg/dL (ref 6–20)
CO2: 24 mmol/L (ref 22–32)
Calcium: 8.1 mg/dL — ABNORMAL LOW (ref 8.9–10.3)
Chloride: 108 mmol/L (ref 98–111)
Creatinine, Ser: 0.54 mg/dL (ref 0.44–1.00)
GFR, Estimated: >60 mL/min (ref >60)
Glucose, Bld: 86 mg/dL (ref 70–99)
Potassium: 3.7 mmol/L (ref 3.5–5.1)
Sodium: 138 mmol/L (ref 135–145)
Total Bilirubin: 1.2 mg/dL (ref 0.3–1.2)
Total Protein: 6.2 g/dL — ABNORMAL LOW (ref 6.5–8.1)

## 2020-12-20 LAB — CBC
HCT: 32.5 % — ABNORMAL LOW (ref 36.0–46.0)
Hemoglobin: 11.2 g/dL — ABNORMAL LOW (ref 12.0–15.0)
MCH: 30.8 pg (ref 26.0–34.0)
MCHC: 34.5 g/dL (ref 30.0–36.0)
MCV: 89.3 fL (ref 80.0–100.0)
Platelets: 254 10*3/uL (ref 150–400)
RBC: 3.64 MIL/uL — ABNORMAL LOW (ref 3.87–5.11)
RDW: 12.9 % (ref 11.5–15.5)
WBC: 10.1 10*3/uL (ref 4.0–10.5)
nRBC: 0 % (ref 0.0–0.2)

## 2020-12-20 MED ORDER — HYDROCODONE-ACETAMINOPHEN 5-325 MG PO TABS: 1.0000 | ORAL_TABLET | Freq: Four times a day (QID) | ORAL | 0 refills | Status: DC | PRN

## 2020-12-20 MED ORDER — ONDANSETRON 4 MG PO TBDP
4.0000 mg | ORAL_TABLET | Freq: Three times a day (TID) | ORAL | 0 refills | Status: DC | PRN
Start: 2020-12-20 — End: 2021-01-19

## 2020-12-20 NOTE — Discharge Instructions (Signed)
  Diet: Resume home heart healthy regular diet.   Activity: No heavy lifting >20 pounds (children, pets, laundry, garbage) or strenuous activity until follow-up, but light activity and walking are encouraged. Do not drive or drink alcohol if taking narcotic pain medications.  Wound care: May shower with soapy water and pat dry (do not rub incisions), but no baths or submerging incision underwater until follow-up. (no swimming)   Medications: Resume all home medications. For mild to moderate pain: acetaminophen (Tylenol) or ibuprofen (if no kidney disease). Combining Tylenol with alcohol can substantially increase your risk of causing liver disease. Narcotic pain medications, if prescribed, can be used for severe pain, though may cause nausea, constipation, and drowsiness. Do not combine Tylenol and Norco within a 6 hour period as Norco contains Tylenol. If you do not need the narcotic pain medication, you do not need to fill the prescription.  Call office at any time if any questions, worsening pain, fevers/chills, bleeding, drainage from incision site, or other concerns.

## 2020-12-20 NOTE — Progress Notes (Signed)
Discharge instructions and med details reviewed with patient. All questions answered. AVS given to patient. IV removed. Pt escorted out via wheelchair.

## 2020-12-20 NOTE — Discharge Summary (Signed)
Patient ID: Kathy Benton MRN: [id number] DOB/AGE: [birth year] 32 y.o.  Admit date: 12/17/2020 Discharge date: 12/20/2020   Discharge Diagnoses:  Active Problems:   Obesity in pregnancy  BMI=35.5   History of hypertension--CHTN dx'd 2008   Gestational diabetes mellitus (GDM) affecting pregnancy with noncompliance & 3 hr GTT at 31 wks on 09/05/20   Acute cholecystitis   Gallstone pancreatitis   Choledocholithiasis   Elevated LFTs   Hypokalemia   Procedures: ERCP                        Robotic assisted laparoscopic cholecystectomy  Hospital Course: Patient with choledocholithiasis and acute cholecystitis.  She underwent ERCP to resolve choledocholithiasis.  She then had robotic assisted laparoscopic cholecystectomy.  She tolerated the procedure well.  This morning the patient with pain control.  She is tolerating diet.  She is ambulating.  Wounds dry and clean.  Physical Exam Cardiovascular:     Rate and Rhythm: Normal rate.  Pulmonary:     Effort: Pulmonary effort is normal.  Abdominal:     General: Abdomen is flat. Bowel sounds are normal.     Palpations: Abdomen is soft.  Skin:    General: Skin is warm.  Neurological:     Mental Status: She is alert and oriented to person, place, and time.      Consults: Gastroenterology                   General surgery   Disposition: Discharge disposition: 01-Home or Self Care       Discharge Instructions    Call MD for:  persistant nausea and vomiting   Complete by: As directed    Call MD for:  redness, tenderness, or signs of infection (pain, swelling, redness, odor or green/yellow discharge around incision site)   Complete by: As directed    Call MD for:  severe uncontrolled pain   Complete by: As directed    Diet - low sodium heart healthy   Complete by: As directed    Diet - low sodium heart healthy   Complete by: As directed    Discharge wound care:   Complete by: As directed    Your incision was closed with  Dermabond.  It is best to keep it clean and dry, it will tolerate a brief shower, but do not soak it or apply any creams or lotions to the incisions.  The Dermabond should gradually flake off over time.  Keep it open to air so you can evaluate your incisions.  Dermabond assists the underlying sutures to keep your incision closed and protected from infection.  Should you develop some drainage from your incision, some drops of drainage would be okay but if it persists continue to put keep a dry dressing over it.   Driving Restrictions   Complete by: As directed    No driving until cleared after follow-up appointment.  Is not advised to drive while taking narcotic pain medications or in significant pain.   Increase activity slowly   Complete by: As directed    Increase activity slowly   Complete by: As directed    Lifting restrictions   Complete by: As directed    Strongly advised against any form of lifting greater than 15 pounds over the next 4 to 6 weeks.  This involves pushing/pulling movements as well.  After 4 weeks when may gradually engage in more activities remaining aware of any new pain/tenderness elicited, and  avoiding those for the full duration of 6 weeks.  Walking is encouraged.  Climbing stairs with caution.     Allergies as of 12/20/2020   No Known Allergies     Medication List    STOP taking these medications   oxyCODONE-acetaminophen 5-325 MG tablet Commonly known as: PERCOCET/ROXICET     TAKE these medications   acetaminophen 325 MG tablet Commonly known as: TYLENOL Take 2 tablets (650 mg total) by mouth every 4 (four) hours as needed (for pain scale < 4ORtemperature>/=100.5 F).   calcium carbonate 500 MG chewable tablet Commonly known as: TUMS - dosed in mg elemental calcium Chew 1,000 mg by mouth daily as needed for heartburn or indigestion.   HYDROcodone-acetaminophen 5-325 MG tablet Commonly known as: NORCO/VICODIN Take 1 tablet by mouth every 6 (six)  hours as needed for severe pain.   ibuprofen 600 MG tablet Commonly known as: ADVIL Take 1 tablet (600 mg total) by mouth every 6 (six) hours as needed for mild pain or moderate pain.   ondansetron 4 MG disintegrating tablet Commonly known as: Zofran ODT Take 1 tablet (4 mg total) by mouth every 8 (eight) hours as needed for nausea or vomiting.   PRENATAL PO Take 1 tablet by mouth daily.            Discharge Care Instructions  (From admission, onward)         Start     Ordered   12/19/20 0000  Discharge wound care:       Comments: Your incision was closed with Dermabond.  It is best to keep it clean and dry, it will tolerate a brief shower, but do not soak it or apply any creams or lotions to the incisions.  The Dermabond should gradually flake off over time.  Keep it open to air so you can evaluate your incisions.  Dermabond assists the underlying sutures to keep your incision closed and protected from infection.  Should you develop some drainage from your incision, some drops of drainage would be okay but if it persists continue to put keep a dry dressing over it.   12/19/20 1318          Follow-up Information    Campbell Lerner, MD. Schedule an appointment as soon as possible for a visit on 12/30/2020.   Specialty: General Surgery Contact information: [address] [place] [address] [phone]        Department, [facility] Follow up in 5 day(s).   Contact information: [address] [place] [address] [phone]              This discharge encounter was more than 30-minute most of the time counseling the patient and coordinating plan of care.

## 2020-12-20 NOTE — Discharge Summary (Signed)
[facility] [place] [facility]   PATIENT NAME: Kathy Benton    MR#:  [id number]  DATE OF BIRTH:  1991/09/06  DATE OF ADMISSION:  12/17/2020 ADMITTING PHYSICIAN: Venora Maples, MD  DATE OF DISCHARGE: 12/20/2020  1:37 PM  PRIMARY CARE PHYSICIAN: Department, [facility]    ADMISSION DIAGNOSIS:  Acute cholecystitis [K81.0] Cholecystitis [K81.9] Epigastric abdominal pain [R10.13] Gallstone pancreatitis [K85.10] Abdominal pain [R10.9]  DISCHARGE DIAGNOSIS:  Active Problems:   Obesity in pregnancy  BMI=35.5   History of hypertension--CHTN dx'd 2008   Gestational diabetes mellitus (GDM) affecting pregnancy with noncompliance & 3 hr GTT at 31 wks on 09/05/20   Acute cholecystitis   Gallstone pancreatitis   Choledocholithiasis   Elevated LFTs   Hypokalemia   SECONDARY DIAGNOSIS:   Past Medical History:  Diagnosis Date  . Gestational diabetes 2019   previous pregnancy and current pregnancy   . Headache    migraines  . Hypertension    previous pregnancy     HOSPITAL COURSE:   1.  Acute gallstone pancreatitis, choledocholithiasis and cholecystitis.  MRCP showed a potential stone in the biliary tree and gallbladder wall thickening.  Dr. Servando Snare did an ERCP which was successful in removing stone and biliary sphincterotomy on 12/18/2020.  Dr. Claudine Mouton did a laparoscopic cholecystectomy on 12/19/2020.  Patient was feeling better did have some abdominal soreness.  Small prescription of Norco prescribed.  The patient's pancreatic enzyme trended from 400 down to normal at 23.  Patient's liver function test trended down.  Initially she has had a total bilirubin of 3.8 and then came down to 1.2 on discharge.  AST went from 130 and down into the normal range upon discharge.  ALT went from 196 down to 97 upon discharge. 2.  Elevated liver function test secondary to gallstone pancreatitis and choledocholithiasis. 3.  Hypokalemia replace potassium IV during the  hospital course. 4.  Recent pregnancy with vaginal delivery on 11/05/2020.  Patient also diagnosed with Covid infection at that time and had no respiratory symptoms.  DISCHARGE CONDITIONS:   Satisfactory  CONSULTS OBTAINED:  Treatment Team:  Campbell Lerner, MD  DRUG ALLERGIES:  No Known Allergies  DISCHARGE MEDICATIONS:   Allergies as of 12/20/2020   No Known Allergies     Medication List    STOP taking these medications   oxyCODONE-acetaminophen 5-325 MG tablet Commonly known as: PERCOCET/ROXICET     TAKE these medications   acetaminophen 325 MG tablet Commonly known as: TYLENOL Take 2 tablets (650 mg total) by mouth every 4 (four) hours as needed (for pain scale < 4ORtemperature>/=100.5 F).   calcium carbonate 500 MG chewable tablet Commonly known as: TUMS - dosed in mg elemental calcium Chew 1,000 mg by mouth daily as needed for heartburn or indigestion.   HYDROcodone-acetaminophen 5-325 MG tablet Commonly known as: NORCO/VICODIN Take 1 tablet by mouth every 6 (six) hours as needed for severe pain.   ibuprofen 600 MG tablet Commonly known as: ADVIL Take 1 tablet (600 mg total) by mouth every 6 (six) hours as needed for mild pain or moderate pain.   ondansetron 4 MG disintegrating tablet Commonly known as: Zofran ODT Take 1 tablet (4 mg total) by mouth every 8 (eight) hours as needed for nausea or vomiting.   PRENATAL PO Take 1 tablet by mouth daily.            Discharge Care Instructions  (From admission, onward)         Start  Ordered   12/19/20 0000  Discharge wound care:       Comments: Your incision was closed with Dermabond.  It is best to keep it clean and dry, it will tolerate a brief shower, but do not soak it or apply any creams or lotions to the incisions.  The Dermabond should gradually flake off over time.  Keep it open to air so you can evaluate your incisions.  Dermabond assists the underlying sutures to keep your incision  closed and protected from infection.  Should you develop some drainage from your incision, some drops of drainage would be okay but if it persists continue to put keep a dry dressing over it.   12/19/20 1318           DISCHARGE INSTRUCTIONS:   Follow-up with Dr. Claudine Mouton Follow-up with the health department  If you experience worsening of your admission symptoms, develop shortness of breath, life threatening emergency, suicidal or homicidal thoughts you must seek medical attention immediately by calling 911 or calling your MD immediately  if symptoms less severe.  You Must read complete instructions/literature along with all the possible adverse reactions/side effects for all the Medicines you take and that have been prescribed to you. Take any new Medicines after you have completely understood and accept all the possible adverse reactions/side effects.   Please note  You were cared for by a hospitalist during your hospital stay. If you have any questions about your discharge medications or the care you received while you were in the hospital after you are discharged, you can call the unit and asked to speak with the hospitalist on call if the hospitalist that took care of you is not available. Once you are discharged, your primary care physician will handle any further medical issues. Please note that NO REFILLS for any discharge medications will be authorized once you are discharged, as it is imperative that you return to your primary care physician (or establish a relationship with a primary care physician if you do not have one) for your aftercare needs so that they can reassess your need for medications and monitor your lab values.    Today   CHIEF COMPLAINT:   Chief Complaint  Patient presents with  . Abdominal Pain    HISTORY OF PRESENT ILLNESS:  Kathy Benton  is a 30 y.o. female came in with abdominal pain   VITAL SIGNS:  Blood pressure 138/80, pulse (!) 56,  temperature (!) 97.2 F (36.2 C), temperature source Oral, resp. rate 18, height 4\' 11"  (1.499 m), weight 63.5 kg, last menstrual period 12/15/2020, SpO2 100 %, currently breastfeeding.  I/O:    Intake/Output Summary (Last 24 hours) at 12/20/2020 1541 Last data filed at 12/20/2020 1018 Gross per 24 hour  Intake 1361.03 ml  Output --  Net 1361.03 ml    PHYSICAL EXAMINATION:  GENERAL:  30 y.o.-year-old patient lying in the bed with no acute distress.  EYES: Pupils equal, round, reactive to light and accommodation. No scleral icterus.  HEENT: Head atraumatic, normocephalic. Oropharynx and nasopharynx clear.  LUNGS: Normal breath sounds bilaterally, no wheezing, rales,rhonchi or crepitation. No use of accessory muscles of respiration.  CARDIOVASCULAR: S1, S2 normal. No murmurs, rubs, or gallops.  ABDOMEN: Soft, generalized tenderness.  EXTREMITIES: No pedal edema, cyanosis, or clubbing.  NEUROLOGIC: Cranial nerves II through XII are intact. Muscle strength 5/5 in all extremities. Sensation intact. Gait not checked.  PSYCHIATRIC: The patient is alert and oriented x 3.  SKIN: No obvious  rash, lesion, or ulcer.   DATA REVIEW:   CBC Recent Labs  Lab 12/20/20 0444  WBC 10.1  HGB 11.2*  HCT 32.5*  PLT 254    Chemistries  Recent Labs  Lab 12/20/20 0444  NA 138  K 3.7  CL 108  CO2 24  GLUCOSE 86  BUN 6  CREATININE 0.54  CALCIUM 8.1*  AST 36  ALT 97*  ALKPHOS 158*  BILITOT 1.2     Microbiology Results  Results for orders placed or performed during the hospital encounter of 12/17/20  Resp Panel by RT-PCR (Flu A&B, Covid) Nasopharyngeal Swab     Status: None   Collection Time: 12/18/20 12:43 AM   Specimen: Nasopharyngeal Swab; Nasopharyngeal(NP) swabs in vial transport medium  Result Value Ref Range Status   SARS Coronavirus 2 by RT PCR NEGATIVE NEGATIVE Final    Comment: (NOTE) SARS-CoV-2 target nucleic acids are NOT DETECTED.  The SARS-CoV-2 RNA is generally  detectable in upper respiratory specimens during the acute phase of infection. The lowest concentration of SARS-CoV-2 viral copies this assay can detect is 138 copies/mL. A negative result does not preclude SARS-Cov-2 infection and should not be used as the sole basis for treatment or other patient management decisions. A negative result may occur with  improper specimen collection/handling, submission of specimen other than nasopharyngeal swab, presence of viral mutation(s) within the areas targeted by this assay, and inadequate number of viral copies(<138 copies/mL). A negative result must be combined with clinical observations, patient history, and epidemiological information. The expected result is Negative.  Fact Sheet for Patients:  BloggerCourse.com  Fact Sheet for Healthcare Providers:  SeriousBroker.it  This test is no t yet approved or cleared by the [place] FDA and  has been authorized for detection and/or diagnosis of SARS-CoV-2 by FDA under an Emergency Use Authorization (EUA). This EUA will remain  in effect (meaning this test can be used) for the duration of the COVID-19 declaration under Section 564(b)(1) of the Act, 21 U.S.C.section 360bbb-3(b)(1), unless the authorization is terminated  or revoked sooner.       Influenza A by PCR NEGATIVE NEGATIVE Final   Influenza B by PCR NEGATIVE NEGATIVE Final    Comment: (NOTE) The Xpert Xpress SARS-CoV-2/FLU/RSV plus assay is intended as an aid in the diagnosis of influenza from Nasopharyngeal swab specimens and should not be used as a sole basis for treatment. Nasal washings and aspirates are unacceptable for Xpert Xpress SARS-CoV-2/FLU/RSV testing.  Fact Sheet for Patients: BloggerCourse.com  Fact Sheet for Healthcare Providers: SeriousBroker.it  This test is not yet approved or cleared by the [place] FDA  and has been authorized for detection and/or diagnosis of SARS-CoV-2 by FDA under an Emergency Use Authorization (EUA). This EUA will remain in effect (meaning this test can be used) for the duration of the COVID-19 declaration under Section 564(b)(1) of the Act, 21 U.S.C. section 360bbb-3(b)(1), unless the authorization is terminated or revoked.  Performed at [facility], [address] [place], [place] [address]     RADIOLOGY:  DG C-Arm 1-60 Min-No Report  Result Date: 12/18/2020 Fluoroscopy was utilized by the requesting physician.  No radiographic interpretation.     Management plans discussed with the patient, and she is in agreement.  CODE STATUS:     Code Status Orders  (From admission, onward)         Start     Ordered   12/18/20 0140  Full code  Continuous  12/18/20 0139        Code Status History    Date Active Date Inactive Code Status Order ID Comments User Context   11/05/2020 1848 11/06/2020 2132 Full Code [id number]  Schermerhorn, Ihor Austinhomas J, MD Inpatient   11/05/2020 1652 11/05/2020 1848 Full Code [id number]  Haroldine LawsOxley, Jennifer, CNM Inpatient   11/04/2020 1436 11/04/2020 2039 Full Code [id number]  Caren HazyMaldonado, Kamila A, RN Inpatient   06/24/2018 2254 06/26/2018 1956 Full Code [id number]  Randa NgoMcVey, Rebecca A, CNM Inpatient   06/24/2018 1611 06/24/2018 2254 Full Code [id number]  Randa NgoMcVey, Rebecca A, CNM Inpatient   06/19/2018 0933 06/19/2018 1438 Full Code [id number]  Christeen DouglasBeasley, Bethany, MD Inpatient   Advance Care Planning Activity      TOTAL TIME TAKING CARE OF THIS PATIENT: 31 minutes.    Alford Highlandichard Aliese Brannum M.D on 12/20/2020 at 3:41 PM  Between 7am to 6pm - Pager - [phone]  After 6pm go to [email or web address] - password [email or web address]  [facility]  CC: Primary care physician; Department, [facility]

## 2020-12-22 NOTE — Anesthesia Postprocedure Evaluation (Signed)
Anesthesia Post Note  Patient: Kathy Benton  Procedure(s) Performed: XI ROBOTIC ASSISTED LAPAROSCOPIC CHOLECYSTECTOMY (N/A )  Patient location during evaluation: PACU Anesthesia Type: General Level of consciousness: awake and alert Pain management: pain level controlled Vital Signs Assessment: post-procedure vital signs reviewed and stable Respiratory status: spontaneous breathing and respiratory function stable Cardiovascular status: stable Anesthetic complications: no   No complications documented.   Last Vitals:  Vitals:   12/20/20 0900 12/20/20 1230  BP: 125/84 138/80  Pulse: (!) 57 (!) 56  Resp: 16 18  Temp: (!) 36.4 C (!) 36.2 C  SpO2: 99% 100%    Last Pain:  Vitals:   12/20/20 1230  TempSrc: Oral  PainSc:                  Danial Hlavac K

## 2020-12-23 ENCOUNTER — Encounter: Payer: Self-pay | Admitting: Gastroenterology

## 2020-12-23 ENCOUNTER — Ambulatory Visit: Payer: Self-pay

## 2020-12-23 LAB — SURGICAL PATHOLOGY

## 2020-12-29 ENCOUNTER — Ambulatory Visit: Payer: Self-pay

## 2021-01-07 ENCOUNTER — Ambulatory Visit: Payer: Self-pay | Admitting: Surgery

## 2021-01-19 ENCOUNTER — Emergency Department
Admission: EM | Admit: 2021-01-19 | Discharge: 2021-01-19 | Disposition: A | Discharge status: Home / Self Care | Payer: Self-pay | Attending: Emergency Medicine | Admitting: Emergency Medicine

## 2021-01-19 ENCOUNTER — Emergency Department: Payer: Self-pay

## 2021-01-19 ENCOUNTER — Other Ambulatory Visit: Payer: Self-pay

## 2021-01-19 DIAGNOSIS — Z8616 Personal history of COVID-19: Secondary | ICD-10-CM | POA: Insufficient documentation

## 2021-01-19 DIAGNOSIS — S61551A Open bite of right wrist, initial encounter: Secondary | ICD-10-CM | POA: Insufficient documentation

## 2021-01-19 DIAGNOSIS — W540XXA Bitten by dog, initial encounter: Secondary | ICD-10-CM | POA: Insufficient documentation

## 2021-01-19 DIAGNOSIS — S61511A Laceration without foreign body of right wrist, initial encounter: Secondary | ICD-10-CM | POA: Insufficient documentation

## 2021-01-19 DIAGNOSIS — T148XXA Other injury of unspecified body region, initial encounter: Secondary | ICD-10-CM

## 2021-01-19 DIAGNOSIS — S50811A Abrasion of right forearm, initial encounter: Secondary | ICD-10-CM | POA: Insufficient documentation

## 2021-01-19 MED ORDER — ONDANSETRON 4 MG PO TBDP: 4.0000 mg | ORAL_TABLET | Freq: Four times a day (QID) | ORAL | 0 refills | Status: DC | PRN

## 2021-01-19 MED ORDER — OXYCODONE-ACETAMINOPHEN 5-325 MG PO TABS
1.0000 | ORAL_TABLET | Freq: Once | ORAL | Status: AC
  Administered 2021-01-19: 1 via ORAL
  Filled 2021-01-19: qty 1

## 2021-01-19 MED ORDER — MORPHINE SULFATE (PF) 4 MG/ML IV SOLN
4.0000 mg | Freq: Once | INTRAVENOUS | Status: AC
  Administered 2021-01-19: 4 mg via INTRAMUSCULAR
  Filled 2021-01-19: qty 1

## 2021-01-19 MED ORDER — IBUPROFEN 800 MG PO TABS: 800.0000 mg | ORAL_TABLET | Freq: Three times a day (TID) | ORAL | 0 refills | Status: DC | PRN

## 2021-01-19 MED ORDER — IBUPROFEN 800 MG PO TABS
800.0000 mg | ORAL_TABLET | Freq: Once | ORAL | Status: AC
  Administered 2021-01-19: 800 mg via ORAL
  Filled 2021-01-19: qty 1

## 2021-01-19 MED ORDER — AMOXICILLIN-POT CLAVULANATE 875-125 MG PO TABS
1.0000 | ORAL_TABLET | Freq: Once | ORAL | Status: AC
  Administered 2021-01-19: 1 via ORAL
  Filled 2021-01-19: qty 1

## 2021-01-19 MED ORDER — OXYCODONE-ACETAMINOPHEN 5-325 MG PO TABS
1.0000 | ORAL_TABLET | Freq: Four times a day (QID) | ORAL | 0 refills | Status: AC | PRN
Start: 2021-01-19 — End: 2022-01-19

## 2021-01-19 MED ORDER — OXYCODONE-ACETAMINOPHEN 5-325 MG PO TABS
1.0000 | ORAL_TABLET | ORAL | Status: DC | PRN
  Administered 2021-01-19: 1 via ORAL
  Filled 2021-01-19: qty 1

## 2021-01-19 MED ORDER — LIDOCAINE HCL (PF) 1 % IJ SOLN
5.0000 mL | Freq: Once | INTRAMUSCULAR | Status: AC
  Administered 2021-01-19: 5 mL via INTRADERMAL
  Filled 2021-01-19: qty 5

## 2021-01-19 MED ORDER — AMOXICILLIN-POT CLAVULANATE 875-125 MG PO TABS: 1.0000 | ORAL_TABLET | Freq: Two times a day (BID) | ORAL | 0 refills | Status: AC

## 2021-01-19 NOTE — ED Triage Notes (Signed)
Pt presents to ER from home after being bitten by her own dog appx 20-30 minutes ago.  Pt has several puncture wounds and small lacerations noted to right distal arm/hand.  Pt states dog is not UTD on its rabies vaccine.

## 2021-01-19 NOTE — Discharge Instructions (Signed)
You may wash your wounds 1-2 times a day with warm soap and water and apply over-the-counter Neosporin ointment and a dressing.  Your sutures will absorb and fall out on their own over time.  If they have not come out in 2 weeks, please follow-up with your primary care doctor.  Please take your antibiotics until complete.  Dog bites have very high risk of becoming infected.  Please monitor for signs of redness, warmth, drainage of pus, fever of 100.4 or higher.  If your arm has any sign of infection or your forearm becomes tight, your fingertips become cold, blue or numb, please return to the emergency department.  All the medications you have been provided are safe with breast-feeding.  Oxycodone is a narcotic prescription and may cause sedation.  I recommend if you take this medication that you have another adult with you to help care for your children.  Please do not take more than 20 mg of oxycodone in a 24-hour period.  5 mg of oxycodone every 6 hours will not cause significant sedation in your infant.  Will be safe to continue to breast-feed with these low amounts.

## 2021-01-19 NOTE — ED Notes (Signed)
[name] officer speaking with pt in lobby.

## 2021-01-19 NOTE — ED Provider Notes (Signed)
[facility] Emergency Department Provider Note  ____________________________________________   Event Date/Time   First MD Initiated Contact with Patient 01/19/21 0235     (approximate)  I have reviewed the triage vital signs and the nursing notes.   HISTORY  Chief Complaint Animal Bite    HPI Kathy Benton is a 30 y.o. female who presents to the emergency department with multiple lacerations to the right upper extremity after she was bit by her dog just prior to arrival.  She reports her dog had his rabies vaccinations when he was a puppy less than 2 years ago but they have not been updated since.  She states she has been in contact with police and animal control who will monitor the dog.  She reports her last tetanus vaccination was in the past year she was recently pregnant.  She denies any other injuries other than to the right arm.        Past Medical History:  Diagnosis Date  . Gestational diabetes 2019   previous pregnancy and current pregnancy   . Headache    migraines  . Hypertension    previous pregnancy     Patient Active Problem List   Diagnosis Date Noted  . Hypokalemia   . Acute cholecystitis 12/18/2020  . Gallstone pancreatitis 12/18/2020  . Choledocholithiasis   . Elevated LFTs   . Precipitous delivery 11/05/2020  . COVID-19 11/05/2020  . Indication for care in labor and delivery, antepartum 11/04/2020  . Noncompliant pregnant patient (no care from 07/25/20-09/03/20) x 6 wks and 3 hr GTT on 09/05/20 at 31 wks 09/03/2020  . Supervision of high risk pregnancy in third trimester 09/03/2020  . 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 Maternal varicella, non-immune 06/30/2020  . Gestational diabetes mellitus (GDM) affecting pregnancy with noncompliance & 3 hr GTT at 31 wks on 09/05/20 06/30/2020  . History of hypertension--CHTN dx'd 2008 06/27/2020  . Late prenatal care (21 wks) 06/27/2020  . Obesity in pregnancy  BMI=35.5  01/24/2018    Past Surgical History:  Procedure Laterality Date  . ERCP N/A 12/18/2020   Procedure: ENDOSCOPIC RETROGRADE CHOLANGIOPANCREATOGRAPHY (ERCP);  Surgeon: Midge Minium, MD;  Location: [facility] ENDOSCOPY;  Service: Endoscopy;  Laterality: N/A;    Prior to Admission medications   Medication Sig Start Date End Date Taking? Authorizing Provider  amoxicillin-clavulanate (AUGMENTIN) 875-125 MG tablet Take 1 tablet by mouth 2 (two) times daily for 7 days. 01/19/21 01/26/21 Yes Bowman Higbie N, DO  ibuprofen (ADVIL) 800 MG tablet Take 1 tablet (800 mg total) by mouth every 8 (eight) hours as needed for mild pain. 01/19/21  Yes Agam Tuohy, Baxter Hire N, DO  ondansetron (ZOFRAN ODT) 4 MG disintegrating tablet Take 1 tablet (4 mg total) by mouth every 6 (six) hours as needed for nausea or vomiting. 01/19/21  Yes Denton Derks, Layla Maw, DO  oxyCODONE-acetaminophen (PERCOCET) 5-325 MG tablet Take 1 tablet by mouth every 6 (six) hours as needed for severe pain. 01/19/21 01/19/22 Yes Zakar Brosch, Layla Maw, DO  acetaminophen (TYLENOL) 325 MG tablet Take 2 tablets (650 mg total) by mouth every 4 (four) hours as needed (for pain scale < 4ORtemperature>/=100.5 F). 11/06/20   Gustavo Lah, CNM  calcium carbonate (TUMS - DOSED IN MG ELEMENTAL CALCIUM) 500 MG chewable tablet Chew 1,000 mg by mouth daily as needed for heartburn or indigestion.    [provider]  HYDROcodone-acetaminophen (NORCO/VICODIN) 5-325 MG tablet Take 1 tablet by mouth every 6 (six) hours as needed  for severe pain. 12/20/20   Alford Highland, MD  Prenatal Vit-Fe Fumarate-FA (PRENATAL PO) Take 1 tablet by mouth daily.    [provider]    Allergies Patient has no known allergies.  History reviewed. No pertinent family history.  Social History Social History   Tobacco Use  . Smoking status: Never Smoker  . Smokeless tobacco: Never Used  . Tobacco comment: No passive exposure  Vaping Use  . Vaping Use: Never used  Substance Use  Topics  . Alcohol use: Not Currently  . Drug use: No    Review of Systems Constitutional: No fever. Eyes: No visual changes. ENT: No sore throat. Cardiovascular: Denies chest pain. Respiratory: Denies shortness of breath. Gastrointestinal: No nausea, vomiting, diarrhea. Genitourinary: Negative for dysuria. Musculoskeletal: Negative for back pain. Skin: Negative for rash. Neurological: Negative for focal weakness or numbness.  ____________________________________________   PHYSICAL EXAM:  VITAL SIGNS: ED Triage Vitals  Enc Vitals Group     BP 01/19/21 0032 (!) 153/100     Pulse Rate 01/19/21 0032 79     Resp 01/19/21 0032 18     Temp 01/19/21 0032 97.6 F (36.4 C)     Temp Source 01/19/21 0032 Oral     SpO2 01/19/21 0032 98 %     Weight 01/19/21 0033 158 lb (71.7 kg)     Height 01/19/21 0033 4\' 11"  (1.499 m)     Head Circumference --      Peak Flow --      Pain Score 01/19/21 0033 10     Pain Loc --      Pain Edu? --      Excl. in GC? --    CONSTITUTIONAL: Alert and oriented and responds appropriately to questions. Well-appearing; well-nourished HEAD: Normocephalic EYES: Conjunctivae clear, pupils appear equal, EOM appear intact ENT: normal nose; moist mucous membranes NECK: Supple, normal ROM CARD: RRR; S1 and S2 appreciated; no murmurs, no clicks, no rubs, no gallops RESP: Normal chest excursion without splinting or tachypnea; breath sounds clear and equal bilaterally; no wheezes, no rhonchi, no rales, no hypoxia or respiratory distress, speaking full sentences ABD/GI: Normal bowel sounds; non-distended; soft, non-tender, no rebound, no guarding, no peritoneal signs, no hepatosplenomegaly BACK: The back appears normal EXT: Normal ROM in all joints; no deformity noted, no edema; no cyanosis; 2 puncture wounds to the right dorsal wrist and forearm and 2 puncture wounds to the volar wrist/forearm, 3 cm superficial laceration to the dorsal right wrist, 2+ right radial  pulse, no bony deformity, compartments in the right arm are soft, normal capillary refill, normal sensation to light touch intact diffusely; scattered abrasions to the right arm SKIN: Normal color for age and race; warm; no rash on exposed skin NEURO: Moves all extremities equally PSYCH: The patient's mood and manner are appropriate.       ____________________________________________   LABS (all labs ordered are listed, but only abnormal results are displayed)  ____________________________________________  EKG   ____________________________________________  RADIOLOGY I, Eran Windish, personally viewed and evaluated these images (plain radiographs) as part of my medical decision making, as well as reviewing the written report by the radiologist.  ED MD interpretation: No fracture or foreign body.  Official radiology report(s): DG Wrist 2 Views Right  Result Date: 01/19/2021 CLINICAL DATA:  Dog bite EXAM: RIGHT WRIST - 2 VIEW COMPARISON:  None. FINDINGS: There is no evidence of fracture or dislocation. There is no evidence of arthropathy or other focal bone abnormality. Subcutaneous emphysema  and soft tissue swelling seen on the dorsum of the wrist. No radiopaque foreign body. IMPRESSION: No acute osseous abnormality. Subcutaneous emphysema and swelling on the dorsum of the hand/wrist. Electronically Signed   By: Jonna ClarkBindu  Avutu M.D.   On: 01/19/2021 01:00   DG Hand 2 View Right  Result Date: 01/19/2021 CLINICAL DATA:  Dog bite EXAM: RIGHT HAND - 2 VIEW COMPARISON:  None. FINDINGS: There is no evidence of fracture or dislocation. There is no evidence of arthropathy or other focal bone abnormality. Dorsal subcutaneous emphysema and soft tissue swelling is seen. IMPRESSION: No acute osseous abnormality. Dorsal subcutaneous emphysema and swelling Electronically Signed   By: Jonna ClarkBindu  Avutu M.D.   On: 01/19/2021 01:00     ____________________________________________   PROCEDURES  Procedure(s) performed (including Critical Care):  Procedures  LACERATION REPAIR Performed by: Baxter HireKristen Shastina Rua Authorized by: Baxter HireKristen Azharia Surratt Consent: Verbal consent obtained. Risks and benefits: risks, benefits and alternatives were discussed Consent given by: patient Patient identity confirmed: provided demographic data Prepped and Draped in normal sterile fashion Wound explored  Laceration Location: Right wrist  Laceration Length: 3cm  No Foreign Bodies seen or palpated  Anesthesia: local infiltration  Local anesthetic: lidocaine 1% with out epinephrine  Anesthetic total: 3 ml  Irrigation method: syringe Amount of cleaning: standard  Skin closure: Superficial  Number of sutures: 3  Technique: Area anesthetized using lidocaine 1% with out epinephrine. Wound irrigated copiously with sterile saline. Wound then cleaned with Betadine and draped in sterile fashion. Wound closed using 3 simple interrupted sutures with 5-0 Vicryl.  Bacitracin and sterile dressing applied. Good wound approximation and hemostasis achieved.   Patient tolerance: Patient tolerated the procedure well with no immediate complications.  ____________________________________________   INITIAL IMPRESSION / ASSESSMENT AND PLAN / ED COURSE  As part of my medical decision making, I reviewed the following data within the electronic MEDICAL RECORD NUMBER Nursing notes reviewed and incorporated, Old chart reviewed, Notes from prior ED visits and [place] Controlled Substance Database         Patient here with multiple puncture wounds and a small laceration to the right wrist/forearm after she was bitten by her own dog.  X-ray showed no fracture or foreign body.  Neurovascularly intact distally.  Her tetanus vaccination is up-to-date.  She her dog is currently being monitored by animal control.  She does not need rabies vaccines, immunoglobulin at this  time.  Will give Augmentin, pain medication.  Will repair larger laceration to the dorsal wrist.  ED PROGRESS  Right wrist laceration repaired.  All other wounds irrigated copiously, cleaned with Betadine and.  Bacitracin and sterile dressing applied.  Discussed wound care instructions and return precautions.  Will discharge with pain medication, Augmentin.  She states that she plans to surrender the dog to animal control in the morning.  At this time, I do not feel there is any life-threatening condition present. I have reviewed, interpreted and discussed all results (EKG, imaging, lab, urine as appropriate) and exam findings with patient/family. I have reviewed nursing notes and appropriate previous records.  I feel the patient is safe to be discharged home without further emergent workup and can continue workup as an outpatient as needed. Discussed usual and customary return precautions. Patient/family verbalize understanding and are comfortable with this plan.  Outpatient follow-up has been provided as needed. All questions have been answered.  ____________________________________________   FINAL CLINICAL IMPRESSION(S) / ED DIAGNOSES  Final diagnoses:  Dog bite, initial encounter     ED  Discharge Orders         Ordered    ibuprofen (ADVIL) 800 MG tablet  Every 8 hours PRN        01/19/21 0451    oxyCODONE-acetaminophen (PERCOCET) 5-325 MG tablet  Every 6 hours PRN        01/19/21 0451    amoxicillin-clavulanate (AUGMENTIN) 875-125 MG tablet  2 times daily        01/19/21 0451    ondansetron (ZOFRAN ODT) 4 MG disintegrating tablet  Every 6 hours PRN        01/19/21 0451          *Please note:  Kathy Benton was evaluated in Emergency Department on 01/19/2021 for the symptoms described in the history of present illness. She was evaluated in the context of the global COVID-19 pandemic, which necessitated consideration that the patient might be at risk for infection with the  SARS-CoV-2 virus that causes COVID-19. Institutional protocols and algorithms that pertain to the evaluation of patients at risk for COVID-19 are in a state of rapid change based on information released by regulatory bodies including the CDC and federal and state organizations. These policies and algorithms were followed during the patient's care in the ED.  Some ED evaluations and interventions may be delayed as a result of limited staffing during and the pandemic.*   Note:  This document was prepared using Dragon voice recognition software and may include unintentional dictation errors.   Jaisha Villacres, Layla Maw, DO 01/19/21 [phone]

## 2021-01-19 NOTE — ED Notes (Signed)
Spoke with operator [phone] with [employer]

## 2021-01-27 ENCOUNTER — Ambulatory Visit: Payer: Self-pay | Admitting: Advanced Practice Midwife

## 2021-01-27 ENCOUNTER — Encounter: Payer: Self-pay | Admitting: Advanced Practice Midwife

## 2021-01-27 ENCOUNTER — Other Ambulatory Visit: Payer: Self-pay

## 2021-01-27 DIAGNOSIS — Z3009 Encounter for other general counseling and advice on contraception: Secondary | ICD-10-CM

## 2021-01-27 DIAGNOSIS — Z30013 Encounter for initial prescription of injectable contraceptive: Secondary | ICD-10-CM

## 2021-01-27 LAB — HEMOGLOBIN, FINGERSTICK: Hemoglobin: 13.7 g/dL (ref 11.1–15.9)

## 2021-01-27 LAB — WET PREP FOR TRICH, YEAST, CLUE
Trichomonas Exam: NEGATIVE
Yeast Exam: NEGATIVE

## 2021-01-27 LAB — PREGNANCY, URINE: Preg Test, Ur: NEGATIVE

## 2021-01-27 MED ORDER — MEDROXYPROGESTERONE ACETATE 150 MG/ML IM SUSP
150.0000 mg | INTRAMUSCULAR | Status: AC
  Administered 2021-01-27 – 2021-12-29 (×4): 150 mg via INTRAMUSCULAR

## 2021-01-27 NOTE — Progress Notes (Signed)
Post Partum Exam  Kathy Benton is a 30 y.o. MHF nonsmoker [id number] (16, 14, 13, 2, 12 wk infant) female who presents for a postpartum visit. She is 12 weeks postpartum following a spontaneous vaginal delivery on 11/05/20 precipitously with poor pushing at 40 4/7 with left labial laceration and EBL: 800 cc F 7#10. She tested + for covid on L&D admission 11/05/20. She had poorly controlled gestational diabetes and was noncompliant during her pregnancy with her diabetes.  Lap cholecystectomy 12/19/20 and went to ER because her dog bit her on 01/19/21 for stitches in her arm.  Denies cigs, vaping, cigars, MJ.  Last ETOH 01/08/21 (2 [place]).  Last sex 01/25/21 without condom; with current partner x 16 years. Pt states she took ECP on 01/26/21.  Last pap 01/24/18 neg.  I have fully reviewed the prenatal and intrapartum course. The delivery was at 40 4/7 gestational weeks. Her older children help her with the baby.  Bottlefeeding infant q 2 hours (4 oz). Anesthesia: none. Postpartum course has been wnl. Baby's course has been wnl. Baby is feeding by Bottle Bleeding no bleeding. Bowel function is normal. Bladder function is normal. Patient is sexually active. Contraception method is none.   Postpartum depression screening:  Edinburgh Postnatal Depression Scale - 01/27/21 1428      Edinburgh Postnatal Depression Scale:  In the Past 7 Days   I have been able to laugh and see the funny side of things. 0    I have looked forward with enjoyment to things. 0    I have blamed myself unnecessarily when things went wrong. 0    I have been anxious or worried for no good reason. 0    I have felt scared or panicky for no good reason. 0    Things have been getting on top of me. 0    I have been so unhappy that I have had difficulty sleeping. 0    I have felt sad or miserable. 0    I have been so unhappy that I have been crying. 0    The thought of harming myself has occurred to me. 0    Edinburgh Postnatal Depression  Scale Total 0            The following portions of the patient's history were reviewed and updated as appropriate: allergies, current medications, past family history, past medical history, past social history, past surgical history and problem list. Last pap smear done 01/24/18 and was Normal  Review of Systems Pertinent items are noted in HPI.    Objective:  BP 126/81 (BP Location: Left Arm, Patient Position: Sitting, Cuff Size: Normal)   Ht 4\' 11"  (1.499 m)   Wt 162 lb 3.2 oz (73.6 kg)   LMP 01/06/2021 (Approximate)   Breastfeeding No   BMI 32.76 kg/m   Gen: well appearing, NAD HEENT: no scleral icterus CV: RR Lung: Normal WOB Breast:performed-not indicated  Ext: warm well perfused Abdomen: poor tone, soft without masses or tenderness External genitalia well healed wnl Vagina pink with small amt white creamy leukorrhea Pap done Cx friable to pap  GU: wnl Rectal: performed -  not indicated       Assessment:    12 wk postpartum exam. Pap smear done at today's visit.   Plan:   Essential components of care per ACOG recommendations for Comprehensive Postpartum exam:  1.  Mood and well being: Patient with negative depression screening today. Reviewed local resources for support. EPDS is low  risk. Reviewed resources and that mood sx in first year after pregnancy are considered related to pregnancy and to reach out for help at [facility] if needed. Discussed [facility] as link to care and availability of LCSW for counseling  - Patient does not use tobacco.  - hx of drug use? NO  2. Infant care and feeding:  -Patient currently breastmilk feeding? No   -Recommended patient engage with WIC/BFpeer counselors  -Counseled to sign new child up for [facility] services -Social determinants of health (SDOH) reviewed in EPIC. The following needs were identified: older children helping her with infant, pt went to ER with dog bite from her own pit bull  3. Sexuality, contraception and birth  spacing  Contraception: Contraception counseling: Reviewed all forms of birth control options in the tiered based approach. available including abstinence; over the counter/barrier methods; hormonal contraceptive medication including pill, patch, ring, injection,contraceptive implant; hormonal and nonhormonal IUDs; permanent sterilization options including vasectomy and the various tubal sterilization modalities. Risks, benefits, and typical effectiveness rates were reviewed.  Questions were answered.  Written information was also given to the patient to review.  Patient desires DMPA, this was prescribed for patient. She will follow up in  11-13 wks for surveillance.  She was told to call with any further questions, or with any concerns about this method of contraception.  Emphasized use of condoms 100% of the time for STI prevention.  Patient was offered ECP. ECP was not accepted by the patient. ECP counseling was not given - see RN documentation  - Patient does not want a pregnancy in the next year.  Desired family size is 5 children.  - Reviewed forms of contraception in tiered fashion. Patient desired Depo-Provera today.   - Discussed birth spacing of 18 months  4. Sleep and fatigue -Encouraged family/partner/community support of 4 hrs of uninterrupted sleep to help with mood and fatigue  5. Physical Recovery  - Discussed patients delivery and complications - Patient had a left labial  laceration, perineal healing reviewed. Patient expressed understanding - Patient has urinary incontinence? No - Patient is safe to resume physical and sexual activity  6.  Health Maintenance/Chronic Disease - Last pap smear performed today and was unknown   1. Postpartum exam Treat wet mount per standing orders Immunization nurse consult Pt received varivax in hospital on 11/06/20 - WET PREP FOR TRICH, YEAST, CLUE - Hemoglobin, venipuncture - Pregnancy, urine  2. Family planning Needs 2 hour GTT ASAP  for gestational diabetes  - Syphilis Serology, [facility] - HIV [facility] - Chlamydia/Gonorrhea [facility] - IGP, [facility] HPV  3. Encounter for initial prescription of injectable contraceptive Pt declines ECP and wants DMPA today If PT neg today may have DMPA 150 mg IM q 11-13 wks x 1 year Please counsel pt on need for abstinance/back up condoms next 7 days Pt counseled needs to do home PT on 02/08/21 and call us if +   Patient given handout about PCP care in the community Given MVI per family planning program guidelines and availability  Follow up in: 1 day or as needed.

## 2021-01-27 NOTE — Progress Notes (Signed)
Wet Mount and Hgb results reviewed. Per standing orders no treatment indicated. Depo given and tolerated well. Tawny Hopping, RN

## 2021-01-27 NOTE — Progress Notes (Signed)
BP retaken in left arm 126/81. Tawny Hopping, RN

## 2021-01-27 NOTE — Progress Notes (Addendum)
Here today for PP check and Depo. NSVD of female 11/05/2020 at [facility]. Lap Cholecystectomy 12/19/2020 at [facility] ED visit for dog bite 01/19/2021. Most recent Pap Smear was 01/24/2018 (negative.) Wants STD testing today including bloodwork.  Tawny Hopping, RN

## 2021-01-29 ENCOUNTER — Other Ambulatory Visit: Payer: Self-pay

## 2021-01-29 ENCOUNTER — Telehealth: Payer: Self-pay

## 2021-01-29 NOTE — Telephone Encounter (Signed)
Phone call to pt. Pt rescheduled for 2 hr Gtt for 02/04/21, reminded pt of instructions.

## 2021-01-31 LAB — IGP, APTIMA HPV
HPV Aptima: NEGATIVE
PAP Smear Comment: 0

## 2021-02-04 ENCOUNTER — Other Ambulatory Visit: Payer: Self-pay

## 2021-02-04 ENCOUNTER — Telehealth: Payer: Self-pay

## 2021-02-04 NOTE — Telephone Encounter (Signed)
Pt had appt on 02/04/21 in RN clinic, pt did NOT show for 2 hour gtt. This is the second appt that pt has missed for the 2 hr gtt.

## 2021-02-05 ENCOUNTER — Telehealth: Payer: Self-pay

## 2021-02-05 NOTE — Telephone Encounter (Signed)
Call has been made to client on 02/05/2021 - refer to phone encounter from that day. Jossie Ng, RN

## 2021-02-05 NOTE — Telephone Encounter (Signed)
DNKA x2 for 2 hour GTT (post-partum). Call to client and left message to call and reschedule appt. Message included reason for importance of having the 2 hr GTT. Number to call provided. Jossie Ng, RN

## 2021-02-09 NOTE — Telephone Encounter (Signed)
Call to client in attempt to reschedule missed appt for 2 hour GTT (post-partum). Left message to call with number to call provided. Jossie Ng, RN

## 2021-04-08 ENCOUNTER — Ambulatory Visit: Payer: Self-pay

## 2021-04-13 ENCOUNTER — Ambulatory Visit (LOCAL_COMMUNITY_HEALTH_CENTER): Payer: Self-pay | Admitting: Family Medicine

## 2021-04-13 ENCOUNTER — Other Ambulatory Visit: Payer: Self-pay

## 2021-04-13 VITALS — BP 130/81

## 2021-04-13 DIAGNOSIS — Z3009 Encounter for other general counseling and advice on contraception: Secondary | ICD-10-CM

## 2021-04-13 DIAGNOSIS — Z30013 Encounter for initial prescription of injectable contraceptive: Secondary | ICD-10-CM

## 2021-04-13 DIAGNOSIS — Z3042 Encounter for surveillance of injectable contraceptive: Secondary | ICD-10-CM

## 2021-04-13 NOTE — Progress Notes (Signed)
This patient was not seen by a provider today.    Kathy Marriott, FNP  

## 2021-04-13 NOTE — Progress Notes (Signed)
Pt here for Depo shot only.  BP 130/81. Depo 150 mg given IM in Lt deltoid without any complications.  Pt given a reminder card to schedule next appt. around 06/29/2021.   Provider did not see this patient.  This was a nurse only appt.  Berdie Ogren, RN

## 2021-06-29 ENCOUNTER — Other Ambulatory Visit: Payer: Self-pay

## 2021-06-29 ENCOUNTER — Ambulatory Visit (LOCAL_COMMUNITY_HEALTH_CENTER): Payer: Self-pay

## 2021-06-29 VITALS — BP 124/69 | Ht 59.0 in | Wt 174.0 lb

## 2021-06-29 DIAGNOSIS — Z30013 Encounter for initial prescription of injectable contraceptive: Secondary | ICD-10-CM

## 2021-06-29 DIAGNOSIS — Z3042 Encounter for surveillance of injectable contraceptive: Secondary | ICD-10-CM

## 2021-06-29 DIAGNOSIS — Z3009 Encounter for other general counseling and advice on contraception: Secondary | ICD-10-CM

## 2021-06-29 NOTE — Progress Notes (Signed)
11 weeks 0 days post depo. Voices no concerns. States she did not have 2 hr GTT after pp visit and declines to schedule test. Also, reports no PCP or h insurance. RN counseled about impt of f-u after gestational diabetes and local provider resource list given. Depo given today per order by Hazle Coca, CNM dated 01/27/2021. Tolerated well r deltoid. Next depo due 09/14/2021, has reminder. Jerel Shepherd, RN

## 2021-07-23 NOTE — Progress Notes (Signed)
See RN notes

## 2021-10-08 ENCOUNTER — Ambulatory Visit (LOCAL_COMMUNITY_HEALTH_CENTER): Payer: Self-pay

## 2021-10-08 ENCOUNTER — Other Ambulatory Visit: Payer: Self-pay

## 2021-10-08 VITALS — BP 136/92 | Ht 59.0 in | Wt 172.5 lb

## 2021-10-08 DIAGNOSIS — Z3042 Encounter for surveillance of injectable contraceptive: Secondary | ICD-10-CM

## 2021-10-08 DIAGNOSIS — Z3009 Encounter for other general counseling and advice on contraception: Secondary | ICD-10-CM

## 2021-10-08 NOTE — Progress Notes (Signed)
Consult with Arnetha Courser, CNM regarding elevated blood pressure 136/92.  Patient was in the middle of a phone call to Medicaid when called back and she has 2 small children with her that were very needy for her attention. Ok to give Depo today.  Retake blood pressure before patient leaves.  Blood pressure 2nd time was 135/81. DMPA 150 mg given IM (Left Deltoid) today per Verbal Order today by Arnetha Courser, CNM and tolerated well. Hart Carwin, RN

## 2021-12-15 NOTE — Progress Notes (Signed)
This encounter was created in error - please disregard.

## 2021-12-29 ENCOUNTER — Ambulatory Visit (LOCAL_COMMUNITY_HEALTH_CENTER): Payer: Self-pay

## 2021-12-29 ENCOUNTER — Other Ambulatory Visit: Payer: Self-pay

## 2021-12-29 VITALS — HR 100 | Ht 59.0 in | Wt 173.0 lb

## 2021-12-29 DIAGNOSIS — Z30013 Encounter for initial prescription of injectable contraceptive: Secondary | ICD-10-CM

## 2021-12-29 DIAGNOSIS — Z3009 Encounter for other general counseling and advice on contraception: Secondary | ICD-10-CM

## 2021-12-29 NOTE — Progress Notes (Signed)
Per RN documentation on 10/12/2021, Depo administered in left delgoid per 01/27/21 order of E. Sciora CNM. Client tolerated Depo injection without complaint today. Jossie Ng, RN

## 2022-01-04 IMAGING — US US PELVIS COMPLETE
1 series · 13 of 25 positions shown · non-contrast
Comparison: CT abdomen pelvis 12/15/2020

CLINICAL DATA: Left pelvic pain and heavy bleeding X 2 days. First
month postpartum.

EXAM:
TRANSABDOMINAL ULTRASOUND OF PELVIS
DOPPLER ULTRASOUND OF OVARIES
TECHNIQUE: Transabdominal ultrasound examination of the pelvis was performed
including evaluation of the uterus, ovaries, adnexal regions, and
pelvic cul-de-sac.
Color and duplex Doppler ultrasound was utilized to evaluate blood
flow to the ovaries.

[Series 1: us pelvis (transabdominal only) · 13 of 74 slices shown]
[im 1/74]
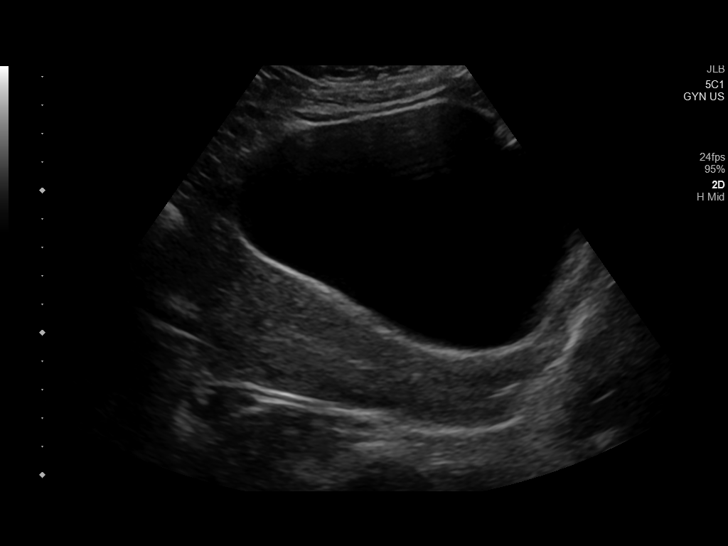
[im 7/74]
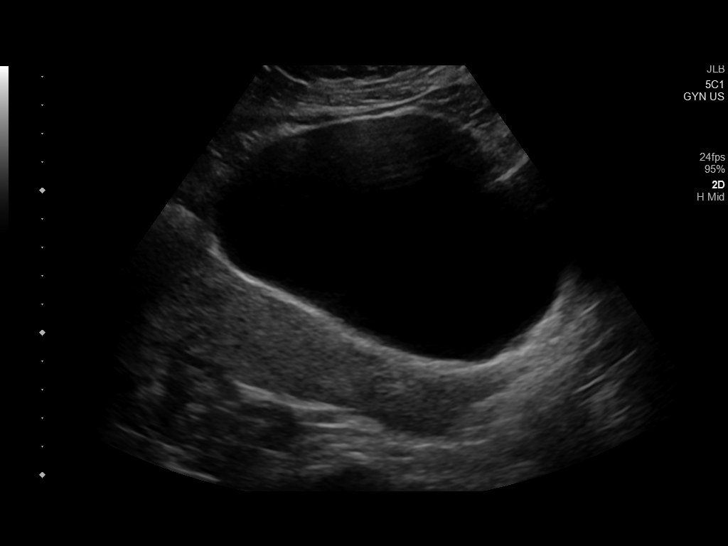
[im 13/74]
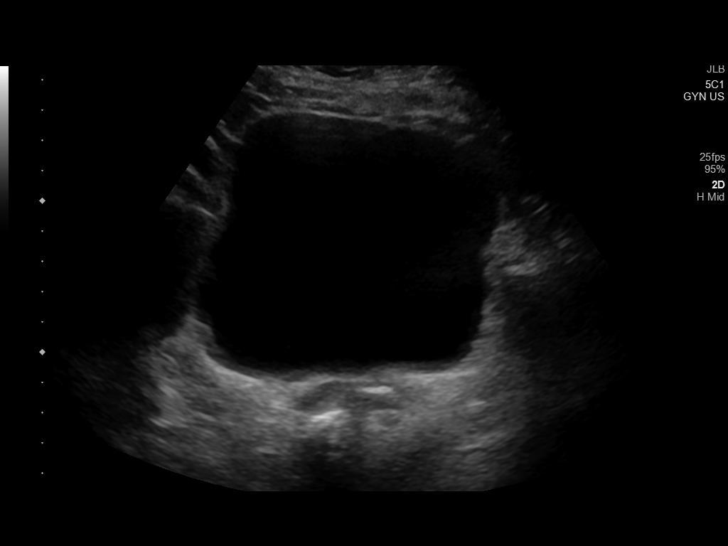
[im 19/74]
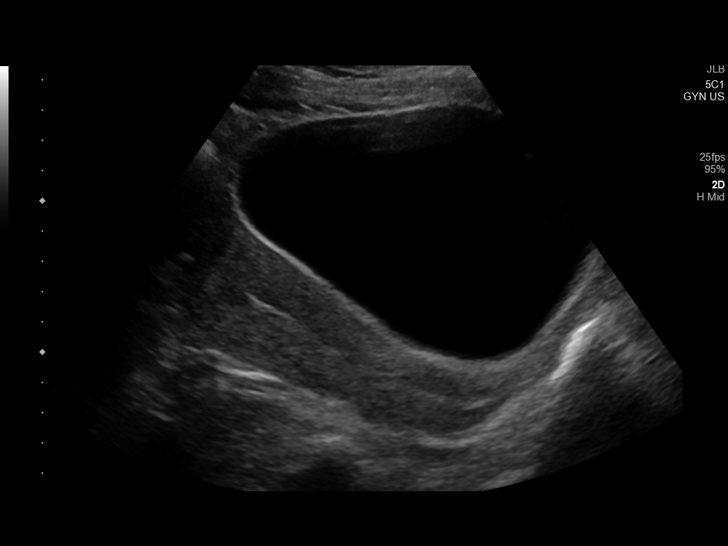
[im 25/74]
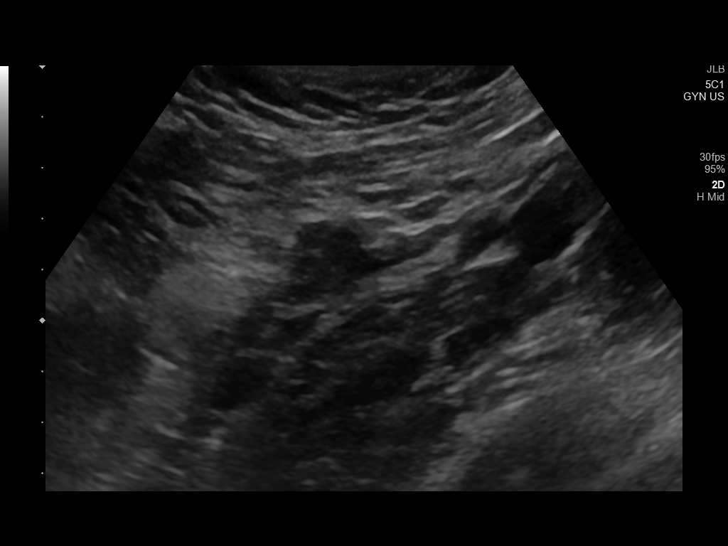
[im 31/74]
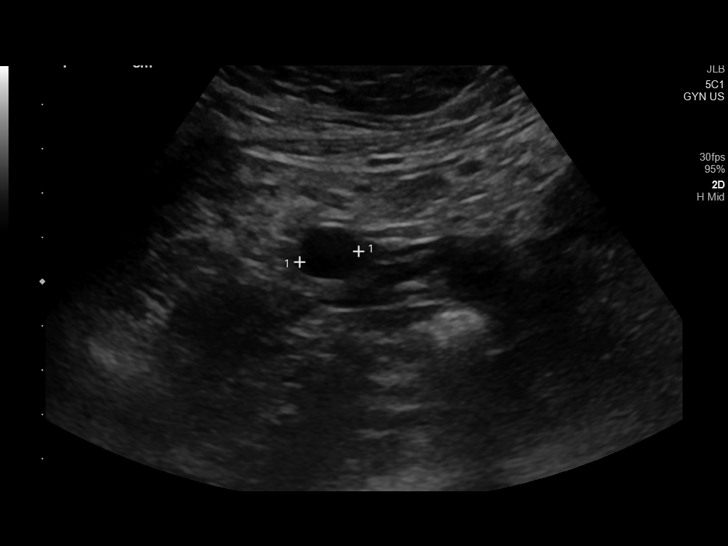
[im 37/74]
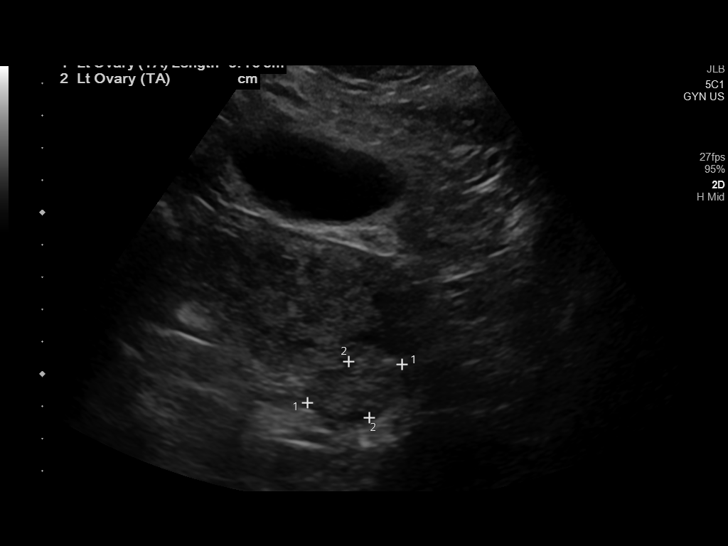
[im 43/74]
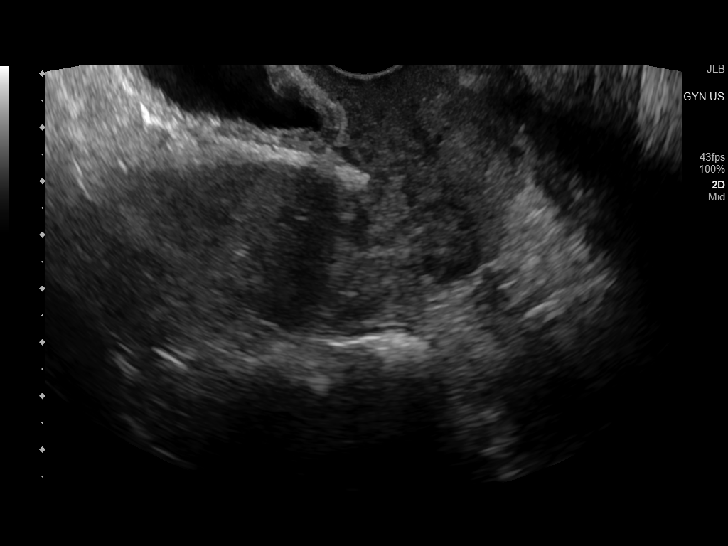
[im 49/74]
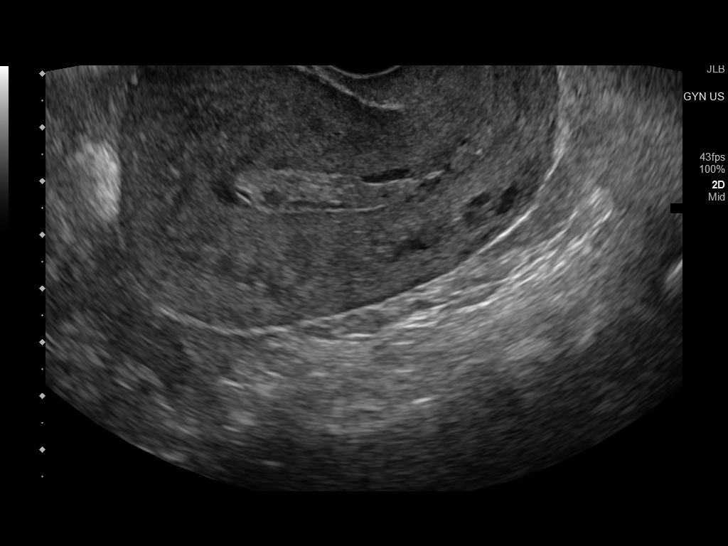
[im 55/74]
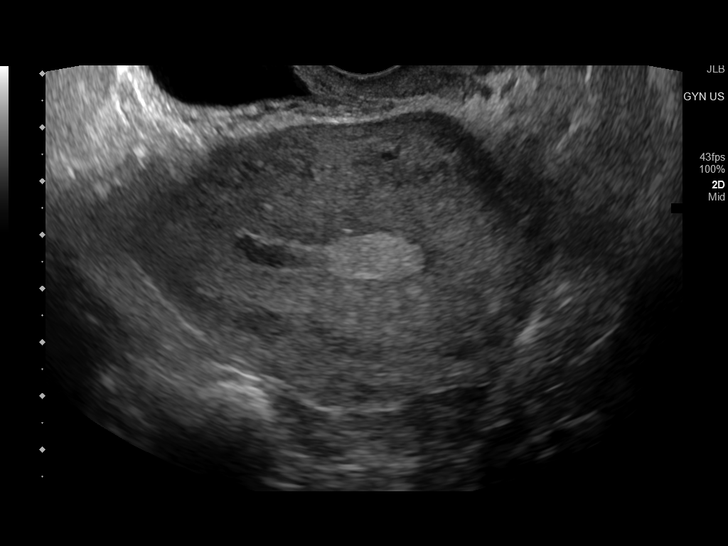
[im 61/74]
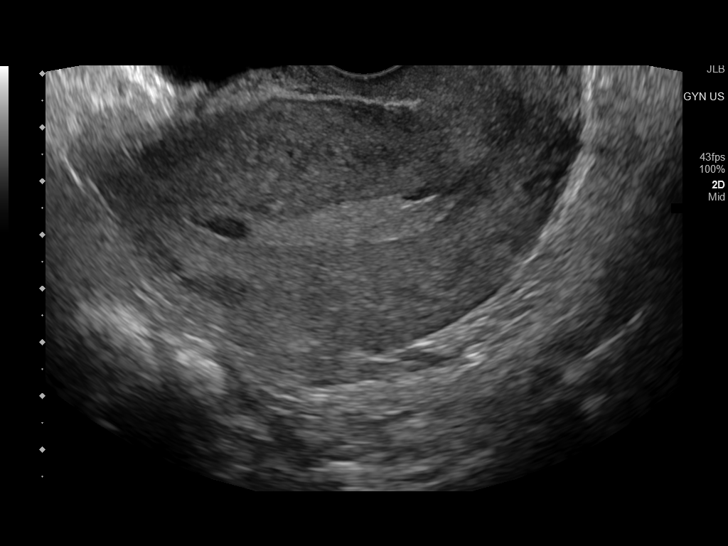
[im 67/74]
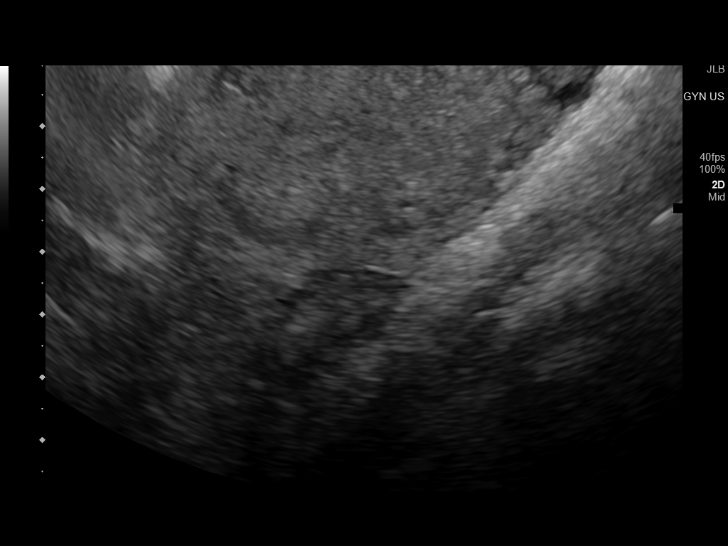
[im 74/74]
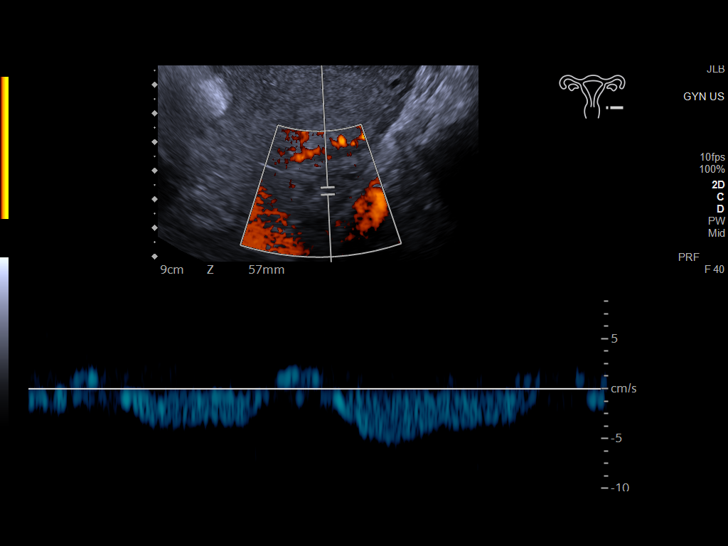

[13 of 25 positions shown; findings below may reference images not displayed]

FINDINGS: Uterus

Measurements: 11.2 x 3.7 x 6.2 cm = volume: 133 mL. No fibroids or
other mass visualized.

Endometrium

Thickness: 9 mm. Small volume free fluid within the endometrial
canal. No endometrial vascularity. No definite mass or focal
abnormality visualized.

Right ovary

Measurements: 2.1 x 1.5 x 1.9 cm = volume: 3.2 mL. Normal
appearance/no adnexal mass.

Left ovary

Measurements: 2.7 x 1.4 x 1.4 cm = volume: 2.8 mL. Normal
appearance/no adnexal mass.

Pulsed Doppler evaluation demonstrates normal low-resistance
arterial and venous waveforms in both ovaries.

Other: No free pelvic fluid.
IMPRESSION: 1. No endometrial vascularity to suggest retained product of
conception or mass.
2. No endometrial findings to suggest endometritis.
3. Normal endometrial caliber with small volume free fluid within
the endometrial canal consistent with given history of bleeding.

## 2022-04-19 ENCOUNTER — Ambulatory Visit: Payer: Self-pay

## 2022-04-20 ENCOUNTER — Ambulatory Visit (LOCAL_COMMUNITY_HEALTH_CENTER): Payer: Self-pay | Admitting: Nurse Practitioner

## 2022-04-20 ENCOUNTER — Encounter: Payer: Self-pay | Admitting: Nurse Practitioner

## 2022-04-20 VITALS — BP 124/86 | Ht 59.0 in | Wt 170.8 lb

## 2022-04-20 DIAGNOSIS — Z3042 Encounter for surveillance of injectable contraceptive: Secondary | ICD-10-CM

## 2022-04-20 DIAGNOSIS — Z0000 Encounter for general adult medical examination without abnormal findings: Secondary | ICD-10-CM

## 2022-04-20 DIAGNOSIS — Z3009 Encounter for other general counseling and advice on contraception: Secondary | ICD-10-CM

## 2022-04-20 MED ORDER — MEDROXYPROGESTERONE ACETATE 150 MG/ML IM SUSP
150.0000 mg | INTRAMUSCULAR | Status: AC
  Administered 2022-04-20 – 2022-11-29 (×3): 150 mg via INTRAMUSCULAR

## 2022-04-20 NOTE — Progress Notes (Signed)
Pt does not desire STD testing today. Depo consent form signed. Depo appointment card provided.  Lethea Killings RN

## 2022-04-20 NOTE — Progress Notes (Signed)
[facility] DEPARTMENT [facility] [address] Main Number: [phone]    Family Planning Visit- Initial Visit  Subjective:  Kathy Benton is a 31 y.o.  [id number]   being seen today for an initial annual visit and to discuss reproductive life planning.  The patient is currently using Hormonal Injection for pregnancy prevention. Patient reports she/her/hers  does not want a pregnancy in the next year.    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  Chief Complaint  Patient presents with   Annual Exam    PE and Depo    Patient reports to the clinic today for a physical and birth control.  Patient desires to continue with a Depo.    Patient denies signs and symptoms   Body mass index is 34.5 kg/m. - Patient is eligible for diabetes screening based on BMI and age >62?  not applicable HA1C ordered? not applicable  Patient reports 1  partner/s in last year. Desires STI screening?  No - refused  Has patient been screened once for HCV in the past?  No  No results found for: "HCVAB"  Does the patient have current drug use (including MJ), have a partner with drug use, and/or has been incarcerated since last result? No  If yes-- Screen for HCV through [facility]   Does the patient meet criteria for HBV testing?  No  Criteria:  -Household, sexual or needle sharing contact with HBV -History of drug use -HIV positive -Those with known Hep C   Health Maintenance Due  Topic Date Due   COVID-19 Vaccine (1) Never done   FOOT EXAM  Never done   OPHTHALMOLOGY EXAM  Never done   URINE MICROALBUMIN  Never done   HEMOGLOBIN A1C  04/13/2021    Review of Systems  Constitutional:  Negative for chills, fever, malaise/fatigue and weight loss.  HENT:  Negative for congestion, hearing loss and sore throat.   Eyes:  Negative for blurred vision, double vision and photophobia.  Respiratory:  Negative for shortness of breath.   Cardiovascular:  Negative for chest pain.  Gastrointestinal:  Negative for abdominal pain, blood in stool, constipation, diarrhea, heartburn, nausea and vomiting.  Genitourinary:  Negative for dysuria and frequency.  Musculoskeletal:  Negative for back pain, joint pain and neck pain.  Skin:  Negative for itching and rash.  Neurological:  Negative for dizziness, weakness and headaches.  Endo/Heme/Allergies:  Does not bruise/bleed easily.  Psychiatric/Behavioral:  Negative for depression, substance abuse and suicidal ideas.     The following portions of the patient's history were reviewed and updated as appropriate: allergies, current medications, past family history, past medical history, past social history, past surgical history and problem list. Problem list updated.   See flowsheet for other program required questions.  Objective:   Vitals:   04/20/22 1312 04/20/22 1321  BP: (!) 145/86 124/86  Weight: 170 lb 12.8 oz (77.5 kg)  Height: 4\' 11"  (1.499 m)     Physical Exam Constitutional:      Appearance: Normal appearance.  HENT:     Head: Normocephalic. No abrasion, masses or laceration. Hair is normal.     Jaw: No tenderness or swelling.     Right Ear: External ear normal.     Left Ear: External ear normal.     Nose: Nose normal.     Mouth/Throat:     Lips: Pink.  No lesions.     Mouth: Mucous membranes are moist. No lacerations or oral lesions.     Dentition: No dental caries.     Tongue: No lesions.     Palate: No mass and lesions.     Pharynx: No pharyngeal swelling, oropharyngeal exudate, posterior oropharyngeal erythema or uvula swelling.     Tonsils: No tonsillar exudate or tonsillar abscesses.  Eyes:     Pupils: Pupils are equal, round, and reactive to light.  Neck:     Thyroid: No thyroid mass, thyromegaly or thyroid tenderness.  Cardiovascular:     Rate and Rhythm: Normal rate and regular rhythm.  Pulmonary:     Effort: Pulmonary effort is normal.     Breath sounds: Normal breath sounds.  Chest:  Breasts:    Right: Normal. No swelling, mass, nipple discharge, skin change or tenderness.     Left: Normal. No swelling, mass, nipple discharge, skin change or tenderness.  Abdominal:     General: Abdomen is flat. Bowel sounds are normal.     Palpations: Abdomen is soft.     Tenderness: There is no abdominal tenderness. There is no rebound.  Genitourinary:    Comments: Deferred, no genital exam performed Musculoskeletal:     Cervical back: Full passive range of motion without pain and normal range of motion.  Lymphadenopathy:     Cervical: No cervical adenopathy.     Right cervical: No superficial, deep or posterior cervical adenopathy.    Left cervical: No superficial, deep or posterior cervical adenopathy.     Upper Body:     Right upper body: No supraclavicular, axillary or epitrochlear adenopathy.     Left upper body: No supraclavicular, axillary or epitrochlear adenopathy.  Skin:    General: Skin is warm and dry.     Findings: No erythema, laceration, lesion or rash.  Neurological:     Mental Status: She is alert and oriented to person, place, and time.  Psychiatric:        Attention and Perception: Attention normal.        Mood and Affect: Mood normal.        Speech: Speech normal.        Behavior: Behavior normal. Behavior  is cooperative.       Assessment and Plan:  Kathy Benton is a 31 y.o. female presenting to the [facility] Department for an initial annual wellness/contraceptive visit  Contraception counseling: Reviewed options based on patient desire and reproductive life plan. Patient is interested in Hormonal Injection. This was provided to the patient today.   Risks, benefits, and typical effectiveness rates were reviewed.  Questions were answered.  Written information was also given to the patient to review.    The patient will follow up in  11 weeks for surveillance.  The patient was told to call with any further questions, or with any concerns about this method of contraception.  Emphasized use of condoms 100% of the time for STI prevention.  Need for ECP was  assessed. ECP not offered due to continuous use of birth control.    1. Family planning counseling -31 year old female in clinic today for a physical and birth control.   -ROS reviewed.  No complaints -Desires to continue with Depo.   2. Well woman exam (no gynecological exam) -Normal well woman exam. -CBE today, next due 04/2025 -PAP 01/27/2021, next due 01/27/2026  3. Surveillance for Depo-Provera contraception -May have Depo 150 MF IM q 11-13 weeks x 1 year.   - medroxyPROGESTERone (DEPO-PROVERA) injection 150 mg     Return in about 11 weeks (around 07/06/2022) for Routine DMPA injection.   Glenna Fellows, FNP

## 2022-07-13 ENCOUNTER — Ambulatory Visit (LOCAL_COMMUNITY_HEALTH_CENTER): Payer: Self-pay

## 2022-07-13 VITALS — BP 113/75 | Ht 59.0 in | Wt 170.5 lb

## 2022-07-13 DIAGNOSIS — Z3009 Encounter for other general counseling and advice on contraception: Secondary | ICD-10-CM

## 2022-07-13 DIAGNOSIS — Z3042 Encounter for surveillance of injectable contraceptive: Secondary | ICD-10-CM

## 2022-07-13 NOTE — Progress Notes (Signed)
12 weeks post hormonal injection. Voices no concerns.  Medroxyprogesterone Acetate 150 mg. administered in right deltoid per order for 1 year dated 04/2022 by Glenna Fellows, FNP.  (Injection in right deltoid due to needle malfunction when attempt made to administer in left deltoid.) Next hormonal injection due 09/28/2022.  Reminder card provided to make appointment.

## 2022-10-19 ENCOUNTER — Ambulatory Visit: Payer: Self-pay

## 2022-11-29 ENCOUNTER — Encounter: Payer: Self-pay | Admitting: Advanced Practice Midwife

## 2022-11-29 ENCOUNTER — Ambulatory Visit (LOCAL_COMMUNITY_HEALTH_CENTER): Payer: Self-pay | Admitting: Advanced Practice Midwife

## 2022-11-29 VITALS — BP 129/80 | HR 74 | Ht 59.0 in | Wt 173.4 lb

## 2022-11-29 DIAGNOSIS — Z3042 Encounter for surveillance of injectable contraceptive: Secondary | ICD-10-CM

## 2022-11-29 DIAGNOSIS — Z3009 Encounter for other general counseling and advice on contraception: Secondary | ICD-10-CM

## 2022-11-29 NOTE — Progress Notes (Signed)
[facility] [place] Main Number: [phone]  Contraception/Family Planning VISIT ENCOUNTER NOTE  Subjective:   Kathy Benton is a 32 y.o. MHF nonsmoker [id number] (35, 33, 39, 4, 2) female here for reproductive life counseling. The patient is currently using Hormonal Injection to prevent pregnancy.  Last DMPA 07/13/22. Last sex 11/25/22 without condom; with current partner x 18 years. LMP 11/27/22.  Last PE 04/20/22. Last pap 01/27/21 neg HPV neg  The patient does not want a pregnancy in the next year.    Client states they are looking for the following:  High efficacy at preventing pregnancy  Denies abnormal vaginal bleeding, discharge, pelvic pain, problems with intercourse or other gynecologic concerns.    Gynecologic History Patient's last menstrual period was 11/27/2022 (exact date).  Health Maintenance Due  Topic Date Due   COVID-19 Vaccine (1) Never done   FOOT EXAM  Never done   OPHTHALMOLOGY EXAM  Never done   HEMOGLOBIN A1C  04/13/2021   Diabetic kidney evaluation - Urine ACR  06/27/2021   Diabetic kidney evaluation - eGFR measurement  12/20/2021   INFLUENZA VACCINE  06/01/2022     The following portions of the patient's history were reviewed and updated as appropriate: allergies, current medications, past family history, past medical history, past social history, past surgical history and problem list.  Review of Systems Pertinent items are noted in HPI.   Objective:  BP 129/80   Pulse 74   Ht 4\' 11"  (1.499 m)   Wt 173 lb 6.4 oz (78.7 kg)   LMP 11/27/2022 (Exact Date)   BMI 35.02 kg/m  Gen: well appearing, NAD HEENT: no scleral icterus CV: RR Lung: Normal WOB Ext: warm well perfused    Assessment and Plan:   Need for emergency contraceptive care was assessed today.  Last unprotected sex was:  11/25/22 without condom  Patient reported > 120 hours .  Reviewed options and patient desired No  method of ECP, declined all    Contraception counseling: Reviewed methods in a patient centered fashion and used shared decision making with the patient. Utilized [employer] patient education tools as appropriate. The patient stated there goals and desires from a method are: High efficacy at preventing pregnancy  We reviewed the following methods in detail based on patient preferences available included: Hormonal Injection  Patient expressed they would like Hormonal Injection  This was provided to the patient today.  if not why not clearly documented  Risks, benefits, and typical effectiveness rates were reviewed.  Questions were answered.  Written information was also given to the patient to review.     she/her/hers  will follow up in  11-13 weeks for surveillance.  she/her/hers was told to call with any further questions, or with any concerns about this method of contraception or cycle control.  Emphasized use of condoms 100% of the time for STI prevention.   1. Family planning Pt states she did 2 home PT's last week and neg  2. Encounter for surveillance of injectable contraceptive May have DMPA 150 mg IM per 04/20/22 order Pt declines PT today Please counsel abstinance next 7 days    Please refer to After Visit Summary for other counseling recommendations.   Return for 11-13 wk DMPA.  Herbie Saxon, [place]

## 2022-11-29 NOTE — Progress Notes (Signed)
Desires to restart Depo. Last Depo injection at [facility] 07/13/2022. Reports LMP began 11/27/2022 and is ongoing. Reports menses is normal. Rich Number, RN Per Ola Spurr CNM, may administer Depo 150mg  IM today as per 04/2022 written order of Ayo White FNP-BC. Client tolerated injection without complaint. Next Depo due date is 02/15/2023 and reminder card mailed to client. Rich Number, RN

## 2023-07-21 ENCOUNTER — Other Ambulatory Visit: Payer: Self-pay

## 2023-07-21 ENCOUNTER — Encounter: Payer: Self-pay | Admitting: Emergency Medicine

## 2023-07-21 ENCOUNTER — Emergency Department
Admission: EM | Admit: 2023-07-21 | Discharge: 2023-07-21 | Disposition: A | Discharge status: Home / Self Care | Payer: Self-pay | Attending: Emergency Medicine | Admitting: Emergency Medicine

## 2023-07-21 ENCOUNTER — Emergency Department: Payer: Self-pay

## 2023-07-21 DIAGNOSIS — N939 Abnormal uterine and vaginal bleeding, unspecified: Secondary | ICD-10-CM

## 2023-07-21 DIAGNOSIS — O209 Hemorrhage in early pregnancy, unspecified: Secondary | ICD-10-CM

## 2023-07-21 DIAGNOSIS — O200 Threatened abortion: Secondary | ICD-10-CM | POA: Insufficient documentation

## 2023-07-21 LAB — BASIC METABOLIC PANEL
Anion gap: 10 (ref 5–15)
BUN: 9 mg/dL (ref 6–20)
CO2: 22 mmol/L (ref 22–32)
Calcium: 9.2 mg/dL (ref 8.9–10.3)
Chloride: 102 mmol/L (ref 98–111)
Creatinine, Ser: 0.53 mg/dL (ref 0.44–1.00)
GFR, Estimated: >60 mL/min (ref >60)
Glucose, Bld: 117 mg/dL — ABNORMAL HIGH (ref 70–99)
Potassium: 3.5 mmol/L (ref 3.5–5.1)
Sodium: 134 mmol/L — ABNORMAL LOW (ref 135–145)

## 2023-07-21 LAB — URINALYSIS, ROUTINE W REFLEX MICROSCOPIC
Bilirubin Urine: NEGATIVE
Glucose, UA: NEGATIVE mg/dL
Ketones, ur: NEGATIVE mg/dL
Leukocytes,Ua: NEGATIVE
Nitrite: NEGATIVE
Protein, ur: NEGATIVE mg/dL
Specific Gravity, Urine: 1.02 (ref 1.005–1.030)
pH: 5 (ref 5.0–8.0)

## 2023-07-21 LAB — CBC
HCT: 39.7 % (ref 36.0–46.0)
Hemoglobin: 13.7 g/dL (ref 12.0–15.0)
MCH: 31.1 pg (ref 26.0–34.0)
MCHC: 34.5 g/dL (ref 30.0–36.0)
MCV: 90 fL (ref 80.0–100.0)
Platelets: 322 10*3/uL (ref 150–400)
RBC: 4.41 MIL/uL (ref 3.87–5.11)
RDW: 12.9 % (ref 11.5–15.5)
WBC: 9.8 10*3/uL (ref 4.0–10.5)
nRBC: 0 % (ref 0.0–0.2)

## 2023-07-21 LAB — ABO/RH: ABO/RH(D): O POS

## 2023-07-21 LAB — POC URINE PREG, ED: Preg Test, Ur: POSITIVE — AB

## 2023-07-21 LAB — HCG, QUANTITATIVE, PREGNANCY: hCG, Beta Chain, Quant, S: 68149 m[IU]/mL — ABNORMAL HIGH (ref <5)

## 2023-07-21 NOTE — Discharge Instructions (Signed)
You were seen in the emergency department today for your vaginal bleeding.  At this time the baby appears normal on ultrasound but there is a small amount of blood surrounding the uterus.  He will need to follow-up with an OB/GYN doctor in 2 to 3 days for reassessment.  Please return to emergency department he has significant worsening of symptoms before this time.

## 2023-07-21 NOTE — ED Notes (Signed)
Unable to doppler FHT in Triage.

## 2023-07-21 NOTE — ED Triage Notes (Signed)
C/O vaginal bleeding x 15 minutes.  States saw blood on tissue after wiping. + home pregnancy test.  Denies dysuria.  States feeling a little bit 'crampy' today.  LMP:  04/27/2023 G: 7 P:5

## 2023-07-21 NOTE — ED Notes (Signed)
See triage note  Presents with some slight cramping and light vaginal bleeding  States this started yesterday  No clots

## 2023-07-21 NOTE — ED Provider Notes (Signed)
[facility] Provider Note    Event Date/Time   First MD Initiated Contact with Patient 07/21/23 1626     (approximate)   History   Vaginal Bleeding   HPI Kathy Benton is a 32 y.o. female G7 P5-0-0-1 presenting today for vaginal bleeding in the setting of pregnancy.  Patient notes last menstrual period was 04/27/2023.  She has taken a positive home pregnancy test but has not seen an obstetrician yet.  Today she noticed vaginal bleeding.  Currently states at this time she only notices it when wiping after peeing.  Denies other vaginal discharge.  Notes lower abdominal cramping but no severe abdominal pain.  Otherwise denying fever, vomiting, nausea.  No dysuria.  Does have 1 prior miscarriage.  At this time notes bleeding is minimal.     Physical Exam   Triage Vital Signs: ED Triage Vitals  Encounter Vitals Group     BP 07/21/23 1539 (!) 146/93     Systolic BP Percentile --      Diastolic BP Percentile --      Pulse Rate 07/21/23 1539 92     Resp 07/21/23 1539 16     Temp 07/21/23 1539 97.8 F (36.6 C)     Temp Source 07/21/23 1539 Oral     SpO2 07/21/23 1539 98 %     Weight 07/21/23 1539 173 lb 8 oz (78.7 kg)     Height 07/21/23 1613 4\' 11"  (1.499 m)     Head Circumference --      Peak Flow --      Pain Score 07/21/23 1539 0     Pain Loc --      Pain Education --      Exclude from Growth Chart --     Most recent vital signs: Vitals:   07/21/23 1539 07/21/23 1945  BP: (!) 146/93 124/78  Pulse: 92 78  Resp: 16 17  Temp: 97.8 F (36.6 C) 99.1 F (37.3 C)  SpO2: 98% 100%   Physical Exam: I have reviewed the vital signs and nursing notes. General: Awake, alert, no acute distress.  Nontoxic appearing. Head:  Atraumatic, normocephalic.   ENT:  EOM intact, PERRL. Oral mucosa is pink and moist with no lesions. Neck: Neck is supple with full range of motion, No meningeal signs. Cardiovascular:  RRR, No murmurs. Peripheral pulses palpable and  equal bilaterally. Respiratory:  Symmetrical chest wall expansion.  No rhonchi, rales, or wheezes.  Good air movement throughout.  No use of accessory muscles.   Musculoskeletal:  No cyanosis or edema. Moving extremities with full ROM Abdomen:  Soft, nontender, nondistended. Neuro:  GCS 15, moving all four extremities, interacting appropriately. Speech clear. Psych:  Calm, appropriate.   Skin:  Warm, dry, no rash.    ED Results / Procedures / Treatments   Labs (all labs ordered are listed, but only abnormal results are displayed) Labs Reviewed  BASIC METABOLIC PANEL - Abnormal; Notable for the following components:      Result Value   Sodium 134 (*)    Glucose, Bld 117 (*)    All other components within normal limits  HCG, QUANTITATIVE, PREGNANCY - Abnormal; Notable for the following components:   hCG, Beta Chain, Quant, S 68,149 (*)    All other components within normal limits  URINALYSIS, ROUTINE W REFLEX MICROSCOPIC - Abnormal; Notable for the following components:   Color, Urine YELLOW (*)    APPearance HAZY (*)    Hgb urine dipstick  LARGE (*)    Bacteria, UA RARE (*)    All other components within normal limits  POC URINE PREG, ED - Abnormal; Notable for the following components:   Preg Test, Ur POSITIVE (*)    All other components within normal limits  CBC  ABO/RH     EKG    RADIOLOGY Independently interpreted ultrasound images along with radiologist showing evidence of subchorionic hemorrhage but otherwise no acute pathology.   PROCEDURES:  Critical Care performed: No  Procedures   MEDICATIONS ORDERED IN ED: Medications - No data to display   IMPRESSION / MDM / ASSESSMENT AND PLAN / ED COURSE  I reviewed the triage vital signs and the nursing notes.                              Differential diagnosis includes, but is not limited to, threatened miscarriage, abortion.  Patient's presentation is most consistent with acute illness / injury with system  symptoms.  Patient is a 32 year old female who is actively pregnant presenting today for vaginal bleeding.  Minimal abdominal pain complaints and vital signs stable.  No evidence of UTI.  O+ and does not need RhoGAM at this time.  Ultrasound of the pregnancy shows small subchorionic hemorrhage but otherwise unremarkable.  Spoke with Dr. Jean Rosenthal with OB/GYN who recommends follow-up in 2 to 3 days but no other acute interventions at this time.  Patient was reassessed with no pain complaints.  Plan for discharge with OB follow-up.  Given strict return precautions.  The patient is on the cardiac monitor to evaluate for evidence of arrhythmia and/or significant heart rate changes. Clinical Course as of 07/21/23 1953  Thu Jul 21, 2023  1644 HCG, Newman Nickels(!): [phone] [DW]  1722 Urinalysis, Routine w reflex microscopic -Urine, Clean Catch(!) No UTI [DW]  1723 Basic metabolic panel(!) Unremarkable [DW]  1723 CBC Unremarkable [DW]  1733 ABO/RH(D): O POS Performed at [facility], [address] [place], [place] [address]  No indication for RhoGAM at this time. [DW]  1925 US OB Comp Less 14 Wks No obvious acute pathology other than small subchorionic hemorrhage. [DW]  8295 Spoke with Dr. Jean Rosenthal with OB/GYN.  Recommends follow-up in 2 to 3 days.  No further interventions at this time. [DW]    Clinical Course User Index [DW] Janith Lima, MD     FINAL CLINICAL IMPRESSION(S) / ED DIAGNOSES   Final diagnoses:  Threatened miscarriage  Vaginal bleeding     Rx / DC Orders   ED Discharge Orders          Ordered    Ambulatory referral to Obstetrics / Gynecology       Comments: Threatened miscarriage   07/21/23 1953             Note:  This document was prepared using Dragon voice recognition software and may include unintentional dictation errors.   Janith Lima, MD 07/21/23 Corky Crafts

## 2023-07-22 ENCOUNTER — Telehealth: Payer: Self-pay | Admitting: Family Medicine

## 2023-07-22 NOTE — Telephone Encounter (Signed)
[facility] ED notes / imaging report reviewed by E. Sciora CNM and per Ms. Sciora, client needs new OB appt. Call to client with above information and client plans to receive prenatal care at [facility]. Verified she has number to call for appt and requested she come prior to appt for preadmission paperwork. Jossie Ng, RN

## 2023-07-22 NOTE — Telephone Encounter (Signed)
Family Planning pt with positive pregnancy test at [facility] ER. She went due to bleeding, which is caused by an apparent tear (baby ok, according to ultrasound). They told her to call us for an OB Problem visit to check on situation. Please call her back to advise her how to proceed. Thanks

## 2023-08-22 ENCOUNTER — Encounter: Payer: Self-pay | Admitting: Advanced Practice Midwife

## 2023-08-22 ENCOUNTER — Ambulatory Visit: Payer: Medicaid Other | Admitting: Advanced Practice Midwife

## 2023-08-22 ENCOUNTER — Telehealth: Payer: Self-pay

## 2023-08-22 VITALS — BP 108/70 | HR 81 | Temp 97.5°F | Wt 180.2 lb

## 2023-08-22 DIAGNOSIS — Z8679 Personal history of other diseases of the circulatory system: Secondary | ICD-10-CM | POA: Diagnosis not present

## 2023-08-22 DIAGNOSIS — O0992 Supervision of high risk pregnancy, unspecified, second trimester: Secondary | ICD-10-CM | POA: Diagnosis not present

## 2023-08-22 DIAGNOSIS — Z8632 Personal history of gestational diabetes: Secondary | ICD-10-CM

## 2023-08-22 DIAGNOSIS — Z23 Encounter for immunization: Secondary | ICD-10-CM

## 2023-08-22 DIAGNOSIS — Z641 Problems related to multiparity: Secondary | ICD-10-CM | POA: Diagnosis not present

## 2023-08-22 DIAGNOSIS — O99212 Obesity complicating pregnancy, second trimester: Secondary | ICD-10-CM

## 2023-08-22 DIAGNOSIS — O0930 Supervision of pregnancy with insufficient antenatal care, unspecified trimester: Secondary | ICD-10-CM | POA: Insufficient documentation

## 2023-08-22 DIAGNOSIS — O99210 Obesity complicating pregnancy, unspecified trimester: Secondary | ICD-10-CM | POA: Insufficient documentation

## 2023-08-22 DIAGNOSIS — O09292 Supervision of pregnancy with other poor reproductive or obstetric history, second trimester: Secondary | ICD-10-CM

## 2023-08-22 LAB — WET PREP FOR TRICH, YEAST, CLUE
Trichomonas Exam: NEGATIVE
Yeast Exam: NEGATIVE

## 2023-08-22 LAB — HEMOGLOBIN, FINGERSTICK: Hemoglobin: 12.7 g/dL (ref 11.1–15.9)

## 2023-08-22 NOTE — Telephone Encounter (Signed)
Call to client and given anatomy US appt scheduled for 09/15/23 with arrival time of 1215 at [facility] in [place]. Facility name and address provided to client. Jossie Ng, RN

## 2023-08-22 NOTE — Progress Notes (Addendum)
Presents for initiation of prenatal care. [facility] ED eval 07/21/23 for vaginal bleedings. [place] with viable pregnancy of 12 weeks and 1 day. Reports no further vaginal bleeding at this time (last was 2 days ago). Denies clots, contractions. Per client, intermittently taking Aspirin (1 adult) as instructed by hospital. Counseled to no longer take that dose of Aspirin and that E. Sciora CNM would notify her if daily 81 mg dose of Aspirin needed. Understanding verbalized. Client had 0 mm TB skin test reading 05/01/2007 and denies any international travel since that time. Client was born in [place] and has been in the [place] since age 32 years. Client DOB, address, phone number, contact name and number verified as correct. Jossie Ng, RN Wet prep negative and hgb = 12.7. No interventions required per standing order. Jossie Ng, RN

## 2023-08-22 NOTE — Progress Notes (Signed)
[facility]  Maternal Health Clinic   INITIAL PRENATAL VISIT NOTE  Subjective:  Kathy Benton is a 32 y.o. MHF nonsmoker  G7P5015 (18,17,16, 5,2) at [redacted]w[redacted]d being seen today to start prenatal care at the [facility] Department.  She feels "good" about surprise pregnancy and got her last DMPA on 11/29/22. 32 yo employed husband of 19 years feels "good" about pregnancy and is the father of all of her children and also has a 42 yo daughter by another woman and that child lives with her mother. She is not working, has been in [place] x 29 years from [place], and is living with her husband and 5 kids.   LMP 04/27/23. Has had 1 u/s this pregnancy when she went to ER for low abdominal cramping and spotting on 07/1923; u/s that day 12 1/7 wks with BP=146/93. Last pap 01/27/21 neg HPV neg. Denies cigs, vaping, cigars. Last MJ 2005. Last ETOH 05/05/23 (3 Smirnoffs). Last dental exam 2008. Highest grade completed 9th.  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  Patient reports no complaints.  Contractions: Not present. Vag. Bleeding: None.  Movement: Absent. Denies leaking of fluid.   Indications for ASA therapy (per uptodate) One of the following: Previous pregnancy with preeclampsia, especially early onset and with an adverse outcome No Multifetal gestation No Chronic hypertension Yes Type 1 or 2 diabetes mellitus No Chronic kidney disease No Autoimmune disease (antiphospholipid syndrome, systemic lupus erythematosus) No  Two or more of the following: Nulliparity No Obesity  (body mass index >30 kg/m2) Yes Family history of preeclampsia in mother or sister No Age >=35 years No Sociodemographic characteristics (African American race, low socioeconomic level) Yes Personal risk factors (eg, previous pregnancy with low birth weight or small for gestational age infant, previous adverse pregnancy outcome [eg, stillbirth], interval >10 years between pregnancies) No   The following portions of the patient's history were reviewed and updated as appropriate: allergies, current medications, past family history, past medical history, past social history, past surgical history and problem list. Problem list updated.  Objective:   Vitals:   08/22/23 0918  BP: 108/70  Pulse: 81  Temp: (!) 97.5 F (36.4 C)  Weight: 180 lb 3.2 oz (81.7 kg)    Fetal Status: Fetal Heart Rate (bpm): 150 Fundal Height: 14 cm Movement: Absent  Presentation: Undeterminable   Physical Exam Vitals and nursing note reviewed.  Constitutional:      General: She is not in acute distress.    Appearance: Normal appearance. She is well-developed. She is obese.  HENT:     Head: Normocephalic and atraumatic.     Right Ear: External ear normal.     Left Ear: External ear normal.     Nose: Nose normal. No congestion or rhinorrhea.     Mouth/Throat:     Lips: Pink.     Mouth: Mucous membranes are moist.     Dentition: Normal dentition. No dental caries.     Pharynx: Oropharynx is clear. Uvula midline.     Comments: Dentition: fair; last dental exam 2008 Eyes:     General: No scleral icterus.  Conjunctiva/sclera: Conjunctivae normal.  Neck:     Thyroid: No thyroid mass, thyromegaly or thyroid tenderness.  Cardiovascular:     Rate and Rhythm: Normal rate.     Pulses: Normal pulses.     Comments: Extremities are warm and well perfused Pulmonary:     Effort: Pulmonary effort is normal.     Breath sounds: Normal breath sounds.  Chest:     Chest wall: No mass.  Breasts:    Tanner Score is  5.     Breasts are symmetrical.     Right: Normal. No mass, nipple discharge or skin change.     Left: Normal. No mass, nipple discharge or skin change.  Abdominal:     Palpations: Abdomen is soft.     Tenderness: There is no abdominal tenderness.     Comments: Gravid,poor tone, increased adipose  Genitourinary:    General: Normal vulva.     Exam position: Lithotomy position.     Pubic Area: No rash.      Labia:        Right: No rash.        Left: No rash.      Vagina: Vaginal discharge (clear leukorrhea, ph<4.5) present.     Cervix: Normal. No cervical motion tenderness or friability.     Uterus: Enlarged (Gravid, difficult to assess due to increased adipose but ~14 wks size, FHR=150). Not tender.      Rectum: Normal. No external hemorrhoid.  Musculoskeletal:     Cervical back: Normal range of motion.     Right lower leg: No edema.     Left lower leg: No edema.  Lymphadenopathy:     Cervical: No cervical adenopathy.     Upper Body:     Right upper body: No axillary adenopathy.     Left upper body: No axillary adenopathy.  Skin:    General: Skin is warm.     Capillary Refill: Capillary refill takes less than 2 seconds.  Neurological:     Mental Status: She is alert.     Assessment and Plan:  Pregnancy: [id number] at 101w5d  1. Late prenatal care 16 5/7 wks   2. Obesity affecting pregnancy, antepartum, unspecified obesity type Pt accepts ASA 81 mg daily (states she was sometimes taking ASA 325 mg during this pregnancy)  3. Supervision of high risk pregnancy in second trimester Needs 1 hour glucola today Counseled on weight gain of 11-20 lbs this pregnancy Desires NIPS today Anatomy u/s ordered with MFM  - Prenatal Profile I - Comprehensive metabolic panel - Glucose, 1 hour - Protein / creatinine ratio, urine - AFP, Serum, Open Spina Bifida - MaterniT 21 plus Core, Blood - Lead, blood (adult age 70 yrs or greater) - ToxAssure Flex 15, Ur - TSH + free T4 - Hgb A1c  w/o eAG - Hemoglobin, venipuncture - WET PREP FOR TRICH, YEAST, CLUE  4. 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 Chronic HTN dx'd 2008 Discussed with pt need to transfer prenatal care to either [facility] or [facility] due to high risk factors (obesity, hx CHTN dx'd 2008, hx gestational diabetes x 2 with noncompliance in care) Pt declines [facility] transfer and wants [facility] transfer even though pt has no Medicaid  (only presumptive) and pt was born in [place] so [facility] might require her to pay cash for each apt. Transfer referral written for [facility]    Discussed overview of care and coordination with inpatient delivery practices including [facility],  [facility].  Reviewed Centering pregnancy as standard of care at [facility]   Preterm labor symptoms and general obstetric precautions including but not limited to vaginal bleeding, contractions, leaking of fluid and fetal movement were reviewed in detail with the patient.  Please refer to After Visit Summary for other counseling recommendations.   No follow-ups on file.  No future appointments.  Alberteen Spindle, CNM

## 2023-08-23 LAB — PROTEIN / CREATININE RATIO, URINE
Creatinine, Urine: 90.1 mg/dL
Protein, Ur: 9.6 mg/dL
Protein/Creat Ratio: 107 mg/g{creat} (ref 0–200)

## 2023-08-26 ENCOUNTER — Telehealth: Payer: Self-pay

## 2023-08-26 DIAGNOSIS — O09899 Supervision of other high risk pregnancies, unspecified trimester: Secondary | ICD-10-CM | POA: Insufficient documentation

## 2023-08-26 LAB — PREGNANCY, INITIAL SCREEN
Antibody Screen: NEGATIVE
Basophils Absolute: 0 10*3/uL (ref 0.0–0.2)
Basos: 0 %
Bilirubin, UA: NEGATIVE
Chlamydia trachomatis, NAA: NEGATIVE
EOS (ABSOLUTE): 0.1 10*3/uL (ref 0.0–0.4)
Eos: 1 %
Glucose, UA: NEGATIVE
HCV Ab: NONREACTIVE
HIV Screen 4th Generation wRfx: NONREACTIVE
Hematocrit: 37.3 % (ref 34.0–46.6)
Hemoglobin: 12.5 g/dL (ref 11.1–15.9)
Hepatitis B Surface Ag: NEGATIVE
Immature Grans (Abs): 0 10*3/uL (ref 0.0–0.1)
Immature Granulocytes: 1 %
Ketones, UA: NEGATIVE
Leukocytes,UA: NEGATIVE
Lymphocytes Absolute: 2 10*3/uL (ref 0.7–3.1)
Lymphs: 27 %
MCH: 31.3 pg (ref 26.6–33.0)
MCHC: 33.5 g/dL (ref 31.5–35.7)
MCV: 94 fL (ref 79–97)
Monocytes Absolute: 0.3 10*3/uL (ref 0.1–0.9)
Monocytes: 4 %
Neisseria Gonorrhoeae by PCR: NEGATIVE
Neutrophils Absolute: 5 10*3/uL (ref 1.4–7.0)
Neutrophils: 67 %
Nitrite, UA: NEGATIVE
Platelets: 277 10*3/uL (ref 150–450)
Protein,UA: NEGATIVE
RBC, UA: NEGATIVE
RBC: 3.99 x10E6/uL (ref 3.77–5.28)
RDW: 13.3 % (ref 11.7–15.4)
RPR Ser Ql: NONREACTIVE
Rh Factor: POSITIVE
Rubella Antibodies, IGG: 0.96 {index} — ABNORMAL LOW (ref >0.99)
Specific Gravity, UA: 1.017 (ref 1.005–1.030)
Urobilinogen, Ur: 0.2 mg/dL (ref 0.2–1.0)
WBC: 7.5 10*3/uL (ref 3.4–10.8)
pH, UA: 7 (ref 5.0–7.5)

## 2023-08-26 LAB — TOXASSURE FLEX 15, UR
6-ACETYLMORPHINE IA: NEGATIVE ng/mL
7-aminoclonazepam: NOT DETECTED ng/mg{creat}
AMPHETAMINES IA: NEGATIVE ng/mL
Alpha-hydroxyalprazolam: NOT DETECTED ng/mg{creat}
Alpha-hydroxymidazolam: NOT DETECTED ng/mg{creat}
Alpha-hydroxytriazolam: NOT DETECTED ng/mg{creat}
Alprazolam: NOT DETECTED ng/mg{creat}
BARBITURATES IA: NEGATIVE ng/mL
BUPRENORPHINE: NEGATIVE
Benzodiazepines: NEGATIVE
Buprenorphine: NOT DETECTED ng/mg{creat}
CANNABINOIDS IA: NEGATIVE ng/mL
COCAINE METABOLITE IA: NEGATIVE ng/mL
Clonazepam: NOT DETECTED ng/mg{creat}
Creatinine: 90 mg/dL
Desalkylflurazepam: NOT DETECTED ng/mg{creat}
Desmethyldiazepam: NOT DETECTED ng/mg{creat}
Desmethylflunitrazepam: NOT DETECTED ng/mg{creat}
Diazepam: NOT DETECTED ng/mg{creat}
ETHYL ALCOHOL Enzymatic: NEGATIVE g/dL
FENTANYL: NEGATIVE
Fentanyl: NOT DETECTED ng/mg{creat}
Flunitrazepam: NOT DETECTED ng/mg{creat}
Lorazepam: NOT DETECTED ng/mg{creat}
METHADONE IA: NEGATIVE ng/mL
METHADONE MTB IA: NEGATIVE ng/mL
Midazolam: NOT DETECTED ng/mg{creat}
Norbuprenorphine: NOT DETECTED ng/mg{creat}
Norfentanyl: NOT DETECTED ng/mg{creat}
OPIATE CLASS IA: NEGATIVE ng/mL
OXYCODONE CLASS IA: NEGATIVE ng/mL
Oxazepam: NOT DETECTED ng/mg{creat}
PHENCYCLIDINE IA: NEGATIVE ng/mL
TAPENTADOL, IA: NEGATIVE ng/mL
TRAMADOL IA: NEGATIVE ng/mL
Temazepam: NOT DETECTED ng/mg{creat}

## 2023-08-26 LAB — MATERNIT 21 PLUS CORE, BLOOD
Fetal Fraction: 23
Result (T21): NEGATIVE
Trisomy 13 (Patau syndrome): NEGATIVE
Trisomy 18 (Edwards syndrome): NEGATIVE
Trisomy 21 (Down syndrome): NEGATIVE

## 2023-08-26 LAB — AFP, SERUM, OPEN SPINA BIFIDA
AFP MoM: 0.69
AFP Value: 21.8 ng/mL
Gest. Age on Collection Date: 16.5 wk
Maternal Age At EDD: 35 a
OSBR Risk 1 IN: 10000
Test Results:: NEGATIVE
Weight: 180 [lb_av]

## 2023-08-26 LAB — TSH+FREE T4
Free T4: 1.01 ng/dL (ref 0.82–1.77)
TSH: 0.947 u[IU]/mL (ref 0.450–4.500)

## 2023-08-26 LAB — COMPREHENSIVE METABOLIC PANEL
ALT: 12 [IU]/L (ref 0–32)
AST: 13 [IU]/L (ref 0–40)
Albumin: 3.7 g/dL — ABNORMAL LOW (ref 3.9–4.9)
Alkaline Phosphatase: 55 [IU]/L (ref 44–121)
BUN/Creatinine Ratio: 6 — ABNORMAL LOW (ref 9–23)
BUN: 3 mg/dL — ABNORMAL LOW (ref 6–20)
Bilirubin Total: 0.4 mg/dL (ref 0.0–1.2)
CO2: 17 mmol/L — ABNORMAL LOW (ref 20–29)
Calcium: 9.1 mg/dL (ref 8.7–10.2)
Chloride: 102 mmol/L (ref 96–106)
Creatinine, Ser: 0.49 mg/dL — ABNORMAL LOW (ref 0.57–1.00)
Globulin, Total: 2.5 g/dL (ref 1.5–4.5)
Glucose: 195 mg/dL — ABNORMAL HIGH (ref 70–99)
Potassium: 3.6 mmol/L (ref 3.5–5.2)
Sodium: 136 mmol/L (ref 134–144)
Total Protein: 6.2 g/dL (ref 6.0–8.5)
eGFR: 127 mL/min/{1.73_m2} (ref >59)

## 2023-08-26 LAB — MICROSCOPIC EXAMINATION
Casts: NONE SEEN /[LPF]
WBC, UA: NONE SEEN /[HPF] (ref 0–5)

## 2023-08-26 LAB — GLUCOSE, 1 HOUR GESTATIONAL: Gestational Diabetes Screen: 191 mg/dL — ABNORMAL HIGH (ref 70–139)

## 2023-08-26 LAB — LEAD, BLOOD (ADULT >= 16 YRS): Lead-Whole Blood: <1 ug/dL (ref 0.0–3.4)

## 2023-08-26 LAB — HCV INTERPRETATION

## 2023-08-26 LAB — HGB A1C W/O EAG: Hgb A1c MFr Bld: 5.2 % (ref 4.8–5.6)

## 2023-08-26 LAB — URINE CULTURE, OB REFLEX: Organism ID, Bacteria: NO GROWTH

## 2023-08-26 NOTE — Telephone Encounter (Signed)
Call to client to schedule 3 hour GTT as 1 hour GTT = 191. Test prep instructions provided to client with stated understanding. Appt scheduled for 08/29/23 with arrival time by 0815. Jossie Ng, RN

## 2023-08-29 ENCOUNTER — Telehealth: Payer: Self-pay

## 2023-08-29 ENCOUNTER — Encounter: Payer: Self-pay | Admitting: Advanced Practice Midwife

## 2023-08-29 DIAGNOSIS — O99810 Abnormal glucose complicating pregnancy: Secondary | ICD-10-CM | POA: Insufficient documentation

## 2023-08-29 NOTE — Telephone Encounter (Signed)
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 Requested 3 hour GTT be scheduled on a Monday. 3 hour GTT rescheduled for 09/05/23 with arrival time of 0800. Aware of test prep instructions. Jossie Ng, RN

## 2023-09-01 ENCOUNTER — Telehealth: Payer: Self-pay

## 2023-09-01 NOTE — Telephone Encounter (Signed)
Call to [facility] to ascertain if transfer of care appt scheduled. Per receptionist, she can see where records / referral has been received and appt in process. Return call from [facility] and female states does not see where client has Medicaid (only presumptive). For a self-pay client, first [facility] appt requires a $1700 deposit with balance to be billed and a self-pay payment plan will be set up. Female plans to contact Inesha with above information. Call to Artelia Laroche, [facility] Gerda Diss, who states currently does not have Medicaid / MPW (only presumptive), but can see where she last had Medicaid in 2021. Jossie Ng, RN

## 2023-09-05 ENCOUNTER — Ambulatory Visit: Payer: Medicaid Other | Admitting: Advanced Practice Midwife

## 2023-09-05 DIAGNOSIS — Z8632 Personal history of gestational diabetes: Secondary | ICD-10-CM

## 2023-09-05 DIAGNOSIS — O0992 Supervision of high risk pregnancy, unspecified, second trimester: Secondary | ICD-10-CM

## 2023-09-05 DIAGNOSIS — O09292 Supervision of pregnancy with other poor reproductive or obstetric history, second trimester: Secondary | ICD-10-CM

## 2023-09-05 NOTE — Progress Notes (Signed)
Patient here for 3 hour gtt. States nothing to eat since 10pm last night and had a few sips of water around 3:00 am. Patient aware of her next MH RV on 09/19/23, and states she has changed her U/S appointment to 09/19/23 at 12:15. States [facility] has called her and she is planning to apply for Medicaid and aware of cost if not approved for Medicaid. Burt Knack, RN

## 2023-09-06 ENCOUNTER — Other Ambulatory Visit: Payer: Self-pay | Admitting: Advanced Practice Midwife

## 2023-09-06 ENCOUNTER — Encounter: Payer: Self-pay | Admitting: *Deleted

## 2023-09-06 ENCOUNTER — Telehealth: Payer: Self-pay

## 2023-09-06 ENCOUNTER — Encounter: Payer: Self-pay | Admitting: Advanced Practice Midwife

## 2023-09-06 DIAGNOSIS — O24419 Gestational diabetes mellitus in pregnancy, unspecified control: Secondary | ICD-10-CM

## 2023-09-06 LAB — GESTATIONAL GLUCOSE TOLERANCE
Glucose, Fasting: 80 mg/dL (ref 70–94)
Glucose, GTT - 1 Hour: 204 mg/dL — ABNORMAL HIGH (ref 70–179)
Glucose, GTT - 2 Hour: 179 mg/dL — ABNORMAL HIGH (ref 70–154)
Glucose, GTT - 3 Hour: 96 mg/dL (ref 70–139)

## 2023-09-06 MED ORDER — BLOOD GLUCOSE TEST VI STRP
100.0000 | ORAL_STRIP | Freq: Four times a day (QID) | 12 refills | Status: DC
Start: 2023-09-06 — End: 2024-01-27

## 2023-09-06 MED ORDER — BLOOD GLUCOSE TEST VI STRP
1.0000 | ORAL_STRIP | Freq: Three times a day (TID) | 0 refills | Status: AC
Start: 2023-09-06 — End: 2023-10-06

## 2023-09-06 MED ORDER — BLOOD GLUCOSE MONITORING SUPPL DEVI
1.0000 | Freq: Three times a day (TID) | 0 refills | Status: DC
Start: 2023-09-06 — End: 2024-01-27

## 2023-09-06 MED ORDER — LANCETS MISC. MISC
1.0000 | Freq: Three times a day (TID) | 0 refills | Status: AC
Start: 2023-09-06 — End: 2023-10-06

## 2023-09-06 MED ORDER — LANCET DEVICE MISC
1.0000 | Freq: Three times a day (TID) | 0 refills | Status: AC
Start: 2023-09-06 — End: 2023-10-06

## 2023-09-06 MED ORDER — LANCETS MISC
1.0000 [IU] | Freq: Four times a day (QID) | 12 refills | Status: DC
Start: 2023-09-06 — End: 2024-01-27

## 2023-09-06 NOTE — Telephone Encounter (Signed)
Call to client to notify her 3 hour GTT test results indicate she has diabetes. Test results faxed to [facility] and fax confirmation received. Also counseled a [facility] referral has been ordered as well as diabetic testing supplies have been prescribed at her pharmacy. Client counseled could come and pick up diabetic testing supplies at [facility] at no cost to get her started with blood sugar testing and given clinic hours when may pick up. Understanding of above verbalized by client. (Prior to calling client, call made to [facility] to ascertain if transfer of care appt scheduled. Per Kyung Rudd, client does not have an appt at [facility]). RN asked client if has spoken with [facility] regarding transfer of care. Client stated she did speak with someone and is aware first appt requires a deposit of $1700 and then will receive a monthly bill for $115. Client states she plans to apply for Medicaid this week.  Above information shared with K. Brewer-Jensen RN, [facility] Coordinator. Jossie Ng, RN

## 2023-09-14 ENCOUNTER — Encounter: Payer: Self-pay | Attending: Advanced Practice Midwife | Admitting: Dietician

## 2023-09-14 ENCOUNTER — Encounter: Payer: Self-pay | Admitting: Dietician

## 2023-09-14 DIAGNOSIS — O24419 Gestational diabetes mellitus in pregnancy, unspecified control: Secondary | ICD-10-CM | POA: Insufficient documentation

## 2023-09-14 DIAGNOSIS — Z3A00 Weeks of gestation of pregnancy not specified: Secondary | ICD-10-CM | POA: Insufficient documentation

## 2023-09-14 NOTE — Progress Notes (Signed)
Patient was seen on 09/14/2023 for Gestational Diabetes self-management class at the [facility]. The following learning objectives were met by the patient during this course:  States the definition of Gestational Diabetes States why dietary management is important in controlling blood glucose Describes the effects each nutrient has on blood glucose levels Demonstrates ability to create a balanced meal plan Demonstrates carbohydrate counting  States when to check blood glucose levels Demonstrates proper blood glucose monitoring techniques States the effect of stress and exercise on blood glucose levels States the importance of limiting caffeine and abstaining from alcohol and smoking   Patient instructed to monitor glucose levels: FBS: 60 - <90 1 hour: <140 2 hour: <120  *Patient received handouts: Nutrition Diabetes and Pregnancy Carbohydrate Counting List Blood glucose log Snack ideas for diabetes during pregnancy  Patient will be seen for follow-up as needed.

## 2023-09-15 ENCOUNTER — Ambulatory Visit: Payer: Self-pay

## 2023-09-15 ENCOUNTER — Other Ambulatory Visit: Payer: Self-pay

## 2023-09-16 ENCOUNTER — Telehealth: Payer: Self-pay

## 2023-09-16 NOTE — Telephone Encounter (Signed)
Call to [facility] and verified client did keep 09/14/23 Diabetes Class appt. Per Florentina Addison at [employer], client did keep appt and had blood glucose testing supplies with her. Jossie Ng, RN

## 2023-09-19 ENCOUNTER — Telehealth: Payer: Self-pay

## 2023-09-19 ENCOUNTER — Ambulatory Visit: Payer: Medicaid Other

## 2023-09-19 ENCOUNTER — Other Ambulatory Visit: Payer: Self-pay | Admitting: *Deleted

## 2023-09-19 ENCOUNTER — Ambulatory Visit (HOSPITAL_BASED_OUTPATIENT_CLINIC_OR_DEPARTMENT_OTHER): Payer: Self-pay

## 2023-09-19 ENCOUNTER — Ambulatory Visit: Payer: Self-pay | Attending: Obstetrics and Gynecology | Admitting: *Deleted

## 2023-09-19 ENCOUNTER — Encounter: Payer: Self-pay | Admitting: *Deleted

## 2023-09-19 ENCOUNTER — Encounter: Payer: Self-pay | Admitting: Advanced Practice Midwife

## 2023-09-19 VITALS — BP 115/61 | HR 80

## 2023-09-19 DIAGNOSIS — O0992 Supervision of high risk pregnancy, unspecified, second trimester: Secondary | ICD-10-CM

## 2023-09-19 DIAGNOSIS — O321XX0 Maternal care for breech presentation, not applicable or unspecified: Secondary | ICD-10-CM | POA: Insufficient documentation

## 2023-09-19 DIAGNOSIS — Z363 Encounter for antenatal screening for malformations: Secondary | ICD-10-CM | POA: Insufficient documentation

## 2023-09-19 DIAGNOSIS — O10919 Unspecified pre-existing hypertension complicating pregnancy, unspecified trimester: Secondary | ICD-10-CM

## 2023-09-19 DIAGNOSIS — O0942 Supervision of pregnancy with grand multiparity, second trimester: Secondary | ICD-10-CM | POA: Insufficient documentation

## 2023-09-19 DIAGNOSIS — Z3A20 20 weeks gestation of pregnancy: Secondary | ICD-10-CM | POA: Insufficient documentation

## 2023-09-19 DIAGNOSIS — O24410 Gestational diabetes mellitus in pregnancy, diet controlled: Secondary | ICD-10-CM | POA: Insufficient documentation

## 2023-09-19 DIAGNOSIS — O10912 Unspecified pre-existing hypertension complicating pregnancy, second trimester: Secondary | ICD-10-CM | POA: Insufficient documentation

## 2023-09-19 DIAGNOSIS — Z362 Encounter for other antenatal screening follow-up: Secondary | ICD-10-CM

## 2023-09-19 DIAGNOSIS — O09899 Supervision of other high risk pregnancies, unspecified trimester: Secondary | ICD-10-CM

## 2023-09-19 NOTE — Telephone Encounter (Signed)
Patient did not arrive for prenatal visit today. Left message on voice mail to call [facility] to reschedule appointment; [phone]. BTHIELE RN

## 2023-09-23 NOTE — Telephone Encounter (Signed)
Left message on voice mail for patient to call [phone] to reschedule missed maternity appointment. BTHIELE RN

## 2023-09-26 NOTE — Telephone Encounter (Signed)
Left message on voice mail for patient to call [phone] to reschedule missed maternity appointment. BTHIELE RN

## 2023-09-27 NOTE — Telephone Encounter (Signed)
Called [facility] today to check on status of transfer of prenatal care. [facility] states aware of transfer but no future appointments scheduled. BTHIELE RN

## 2023-10-18 ENCOUNTER — Ambulatory Visit: Payer: Self-pay

## 2023-10-18 ENCOUNTER — Ambulatory Visit: Payer: Self-pay | Attending: Maternal & Fetal Medicine

## 2023-10-19 NOTE — Addendum Note (Signed)
Addended by: Heywood Bene on: 10/19/2023 03:26 PM   Modules accepted: Orders

## 2023-11-09 ENCOUNTER — Telehealth: Payer: Self-pay

## 2023-11-09 NOTE — Telephone Encounter (Signed)
 Per Annitta Marek in [facility] [employer], client does not yet have Medicaid. Per [employer], client has not been evaluated at facility during this pregnancy and not yet seen at [facility] as no payor source and first non-insured appt requires $1700 deposit. Burnadette Lowers, RN

## 2023-11-09 NOTE — Telephone Encounter (Signed)
 Call to Kathy Benton in [facility] [employer] to verify Medicaid status. Per Ms. Tacey Heap, client does not yet have [employer]. Client has only had presumptive Medicaid thus far in current pregnancy. Jossie Ng, RN

## 2024-01-06 NOTE — Telephone Encounter (Signed)
 Per Artelia Laroche -  [facility], client does not have Medicaid (presumptive Medicaid expired 10/01/2023). Per [employer], client is not receiving prenatal care at [facility]. Per call to [facility], Kara Mead states she is not receiving care with them. Jossie Ng, RN

## 2024-01-25 ENCOUNTER — Encounter: Admission: EM | Disposition: A | Discharge status: Home / Self Care | Payer: Self-pay | Source: Home / Self Care

## 2024-01-25 ENCOUNTER — Inpatient Hospital Stay
Admission: EM | Admit: 2024-01-25 | Discharge: 2024-01-27 | DRG: 784 | Disposition: A | Discharge status: Home / Self Care | Payer: MEDICAID

## 2024-01-25 ENCOUNTER — Observation Stay: Payer: MEDICAID | Admitting: Certified Registered"

## 2024-01-25 ENCOUNTER — Other Ambulatory Visit: Payer: Self-pay

## 2024-01-25 ENCOUNTER — Observation Stay: Payer: MEDICAID

## 2024-01-25 ENCOUNTER — Encounter: Payer: Self-pay | Admitting: Obstetrics and Gynecology

## 2024-01-25 DIAGNOSIS — Z302 Encounter for sterilization: Secondary | ICD-10-CM

## 2024-01-25 DIAGNOSIS — O09899 Supervision of other high risk pregnancies, unspecified trimester: Principal | ICD-10-CM

## 2024-01-25 DIAGNOSIS — O26893 Other specified pregnancy related conditions, third trimester: Secondary | ICD-10-CM | POA: Diagnosis present

## 2024-01-25 DIAGNOSIS — O0930 Supervision of pregnancy with insufficient antenatal care, unspecified trimester: Secondary | ICD-10-CM

## 2024-01-25 DIAGNOSIS — O0933 Supervision of pregnancy with insufficient antenatal care, third trimester: Secondary | ICD-10-CM

## 2024-01-25 DIAGNOSIS — O9081 Anemia of the puerperium: Secondary | ICD-10-CM | POA: Diagnosis not present

## 2024-01-25 DIAGNOSIS — O1092 Unspecified pre-existing hypertension complicating childbirth: Secondary | ICD-10-CM | POA: Diagnosis present

## 2024-01-25 DIAGNOSIS — Z6791 Unspecified blood type, Rh negative: Secondary | ICD-10-CM

## 2024-01-25 DIAGNOSIS — O99214 Obesity complicating childbirth: Secondary | ICD-10-CM | POA: Diagnosis present

## 2024-01-25 DIAGNOSIS — Z8679 Personal history of other diseases of the circulatory system: Secondary | ICD-10-CM

## 2024-01-25 DIAGNOSIS — O479 False labor, unspecified: Secondary | ICD-10-CM | POA: Diagnosis present

## 2024-01-25 DIAGNOSIS — D62 Acute posthemorrhagic anemia: Secondary | ICD-10-CM | POA: Diagnosis not present

## 2024-01-25 DIAGNOSIS — O24420 Gestational diabetes mellitus in childbirth, diet controlled: Secondary | ICD-10-CM | POA: Diagnosis present

## 2024-01-25 DIAGNOSIS — O321XX0 Maternal care for breech presentation, not applicable or unspecified: Secondary | ICD-10-CM | POA: Diagnosis present

## 2024-01-25 DIAGNOSIS — Z3490 Encounter for supervision of normal pregnancy, unspecified, unspecified trimester: Secondary | ICD-10-CM

## 2024-01-25 DIAGNOSIS — Z641 Problems related to multiparity: Secondary | ICD-10-CM

## 2024-01-25 DIAGNOSIS — Z3A39 39 weeks gestation of pregnancy: Secondary | ICD-10-CM

## 2024-01-25 DIAGNOSIS — Z23 Encounter for immunization: Secondary | ICD-10-CM | POA: Diagnosis not present

## 2024-01-25 DIAGNOSIS — Z98891 History of uterine scar from previous surgery: Secondary | ICD-10-CM

## 2024-01-25 DIAGNOSIS — O99210 Obesity complicating pregnancy, unspecified trimester: Secondary | ICD-10-CM | POA: Diagnosis present

## 2024-01-25 DIAGNOSIS — O24419 Gestational diabetes mellitus in pregnancy, unspecified control: Secondary | ICD-10-CM | POA: Diagnosis present

## 2024-01-25 LAB — RAPID HIV SCREEN (HIV 1/2 AB+AG)
HIV 1/2 Antibodies: NONREACTIVE
HIV-1 P24 Antigen - HIV24: NONREACTIVE

## 2024-01-25 LAB — URINE DRUG SCREEN, QUALITATIVE (ARMC ONLY)
Amphetamines, Ur Screen: NOT DETECTED
Barbiturates, Ur Screen: NOT DETECTED
Benzodiazepine, Ur Scrn: NOT DETECTED
Cannabinoid 50 Ng, Ur ~~LOC~~: NOT DETECTED
Cocaine Metabolite,Ur ~~LOC~~: NOT DETECTED
MDMA (Ecstasy)Ur Screen: NOT DETECTED
Methadone Scn, Ur: NOT DETECTED
Opiate, Ur Screen: NOT DETECTED
Phencyclidine (PCP) Ur S: NOT DETECTED
Tricyclic, Ur Screen: NOT DETECTED

## 2024-01-25 LAB — TYPE AND SCREEN
ABO/RH(D): O POS
Antibody Screen: NEGATIVE

## 2024-01-25 LAB — CBC
HCT: 36.3 % (ref 36.0–46.0)
Hemoglobin: 12.5 g/dL (ref 12.0–15.0)
MCH: 31.2 pg (ref 26.0–34.0)
MCHC: 34.4 g/dL (ref 30.0–36.0)
MCV: 90.5 fL (ref 80.0–100.0)
Platelets: 225 10*3/uL (ref 150–400)
RBC: 4.01 MIL/uL (ref 3.87–5.11)
RDW: 13.7 % (ref 11.5–15.5)
WBC: 9 10*3/uL (ref 4.0–10.5)
nRBC: 0 % (ref 0.0–0.2)

## 2024-01-25 LAB — CHLAMYDIA/NGC RT PCR (ARMC ONLY)
Chlamydia Tr: NOT DETECTED
N gonorrhoeae: NOT DETECTED

## 2024-01-25 LAB — RPR: RPR Ser Ql: NONREACTIVE

## 2024-01-25 LAB — GROUP B STREP BY PCR: Group B strep by PCR: NEGATIVE

## 2024-01-25 LAB — GLUCOSE, CAPILLARY
Glucose-Capillary: 102 mg/dL — ABNORMAL HIGH (ref 70–99)
Glucose-Capillary: 76 mg/dL (ref 70–99)

## 2024-01-25 SURGERY — Surgical Case
Anesthesia: Spinal | Laterality: Bilateral

## 2024-01-25 MED ORDER — PHENYLEPHRINE HCL-NACL 20-0.9 MG/250ML-% IV SOLN
INTRAVENOUS | Status: AC
  Filled 2024-01-25: qty 250

## 2024-01-25 MED ORDER — MEPERIDINE HCL 25 MG/ML IJ SOLN: 6.2500 mg | INTRAMUSCULAR | Status: DC | PRN

## 2024-01-25 MED ORDER — LACTATED RINGERS IV SOLN: INTRAVENOUS | Status: DC | PRN

## 2024-01-25 MED ORDER — TRANEXAMIC ACID-NACL 1000-0.7 MG/100ML-% IV SOLN
INTRAVENOUS | Status: AC
  Filled 2024-01-25: qty 100

## 2024-01-25 MED ORDER — FENTANYL CITRATE (PF) 100 MCG/2ML IJ SOLN
INTRAMUSCULAR | Status: AC
  Administered 2024-01-25: 50 ug via INTRAVENOUS
  Filled 2024-01-25: qty 2

## 2024-01-25 MED ORDER — TETANUS-DIPHTH-ACELL PERTUSSIS 5-2.5-18.5 LF-MCG/0.5 IM SUSY
0.5000 mL | PREFILLED_SYRINGE | Freq: Once | INTRAMUSCULAR | Status: AC
  Administered 2024-01-27: 0.5 mL via INTRAMUSCULAR
  Filled 2024-01-25: qty 0.5

## 2024-01-25 MED ORDER — FENTANYL CITRATE (PF) 100 MCG/2ML IJ SOLN
INTRAMUSCULAR | Status: DC | PRN
  Administered 2024-01-25: 10 ug via INTRAVENOUS

## 2024-01-25 MED ORDER — TERBUTALINE SULFATE 1 MG/ML IJ SOLN
INTRAMUSCULAR | Status: AC
Start: 2024-01-25 — End: 2024-01-25
  Administered 2024-01-25: 0.25 mg via SUBCUTANEOUS
  Filled 2024-01-25: qty 1

## 2024-01-25 MED ORDER — OXYCODONE HCL 5 MG PO TABS: 5.0000 mg | ORAL_TABLET | Freq: Four times a day (QID) | ORAL | Status: DC | PRN

## 2024-01-25 MED ORDER — WITCH HAZEL-GLYCERIN EX PADS: 1.0000 | MEDICATED_PAD | CUTANEOUS | Status: DC | PRN

## 2024-01-25 MED ORDER — SCOPOLAMINE 1 MG/3DAYS TD PT72: 1.0000 | MEDICATED_PATCH | Freq: Once | TRANSDERMAL | Status: DC

## 2024-01-25 MED ORDER — ONDANSETRON HCL 4 MG/2ML IJ SOLN
INTRAMUSCULAR | Status: DC | PRN
  Administered 2024-01-25: 4 mg via INTRAVENOUS

## 2024-01-25 MED ORDER — TERBUTALINE SULFATE 1 MG/ML IJ SOLN: 0.2500 mg | Freq: Once | INTRAMUSCULAR | Status: AC

## 2024-01-25 MED ORDER — LIDOCAINE HCL (PF) 1 % IJ SOLN
INTRAMUSCULAR | Status: DC | PRN
  Administered 2024-01-25: 3 mL via SUBCUTANEOUS

## 2024-01-25 MED ORDER — SIMETHICONE 80 MG PO CHEW: 80.0000 mg | CHEWABLE_TABLET | ORAL | Status: DC | PRN

## 2024-01-25 MED ORDER — BUPIVACAINE HCL (PF) 0.25 % IJ SOLN
INTRAMUSCULAR | Status: AC
  Filled 2024-01-25: qty 30

## 2024-01-25 MED ORDER — DIPHENHYDRAMINE HCL 25 MG PO CAPS: 25.0000 mg | ORAL_CAPSULE | ORAL | Status: DC | PRN

## 2024-01-25 MED ORDER — CEFAZOLIN SODIUM-DEXTROSE 2-3 GM-%(50ML) IV SOLR
INTRAVENOUS | Status: DC | PRN
  Administered 2024-01-25: 2 g via INTRAVENOUS

## 2024-01-25 MED ORDER — OXYTOCIN-SODIUM CHLORIDE 30-0.9 UT/500ML-% IV SOLN
2.5000 [IU]/h | INTRAVENOUS | Status: AC
  Administered 2024-01-25: 2.5 [IU]/h via INTRAVENOUS
  Filled 2024-01-25: qty 500

## 2024-01-25 MED ORDER — CEFAZOLIN SODIUM-DEXTROSE 2-4 GM/100ML-% IV SOLN
INTRAVENOUS | Status: AC
Start: 2024-01-25 — End: 2024-01-26
  Filled 2024-01-25: qty 100

## 2024-01-25 MED ORDER — FLEET ENEMA RE ENEM: 1.0000 | ENEMA | Freq: Every day | RECTAL | Status: DC | PRN

## 2024-01-25 MED ORDER — KETOROLAC TROMETHAMINE 30 MG/ML IJ SOLN
INTRAMUSCULAR | Status: AC
  Filled 2024-01-25: qty 1

## 2024-01-25 MED ORDER — KETOROLAC TROMETHAMINE 30 MG/ML IJ SOLN
30.0000 mg | Freq: Four times a day (QID) | INTRAMUSCULAR | Status: DC
  Administered 2024-01-25 – 2024-01-26 (×2): 30 mg via INTRAVENOUS
  Filled 2024-01-25: qty 1

## 2024-01-25 MED ORDER — OXYTOCIN-SODIUM CHLORIDE 30-0.9 UT/500ML-% IV SOLN
INTRAVENOUS | Status: DC | PRN
  Administered 2024-01-25: 30 [IU] via INTRAVENOUS

## 2024-01-25 MED ORDER — ACETAMINOPHEN 500 MG PO TABS: 1000.0000 mg | ORAL_TABLET | Freq: Four times a day (QID) | ORAL | Status: DC

## 2024-01-25 MED ORDER — NALOXONE HCL 4 MG/10ML IJ SOLN: 1.0000 ug/kg/h | INTRAVENOUS | Status: DC | PRN

## 2024-01-25 MED ORDER — FERROUS SULFATE 325 (65 FE) MG PO TABS
325.0000 mg | ORAL_TABLET | Freq: Two times a day (BID) | ORAL | Status: DC
  Administered 2024-01-26 – 2024-01-27 (×3): 325 mg via ORAL
  Filled 2024-01-25 (×3): qty 1

## 2024-01-25 MED ORDER — MEASLES, MUMPS & RUBELLA VAC IJ SOLR
0.5000 mL | Freq: Once | INTRAMUSCULAR | Status: AC
  Administered 2024-01-27: 0.5 mL via SUBCUTANEOUS
  Filled 2024-01-25 (×2): qty 0.5

## 2024-01-25 MED ORDER — KETOROLAC TROMETHAMINE 30 MG/ML IJ SOLN: 30.0000 mg | Freq: Four times a day (QID) | INTRAMUSCULAR | Status: DC | PRN

## 2024-01-25 MED ORDER — MORPHINE SULFATE (PF) 0.5 MG/ML IJ SOLN
INTRAMUSCULAR | Status: DC | PRN
  Administered 2024-01-25: .05 mg via EPIDURAL

## 2024-01-25 MED ORDER — ACETAMINOPHEN 500 MG PO TABS
1000.0000 mg | ORAL_TABLET | Freq: Four times a day (QID) | ORAL | Status: DC
  Administered 2024-01-25 – 2024-01-26 (×2): 1000 mg via ORAL
  Filled 2024-01-25: qty 2

## 2024-01-25 MED ORDER — BUPIVACAINE IN DEXTROSE 0.75-8.25 % IT SOLN
INTRATHECAL | Status: DC | PRN
  Administered 2024-01-25: 1.4 mL via INTRATHECAL

## 2024-01-25 MED ORDER — SOD CITRATE-CITRIC ACID 500-334 MG/5ML PO SOLN
ORAL | Status: AC
  Administered 2024-01-25: 30 mL
  Filled 2024-01-25: qty 15

## 2024-01-25 MED ORDER — DIBUCAINE (PERIANAL) 1 % EX OINT: 1.0000 | TOPICAL_OINTMENT | CUTANEOUS | Status: DC | PRN

## 2024-01-25 MED ORDER — DIPHENHYDRAMINE HCL 50 MG/ML IJ SOLN: 12.5000 mg | INTRAMUSCULAR | Status: DC | PRN

## 2024-01-25 MED ORDER — BISACODYL 10 MG RE SUPP: 10.0000 mg | Freq: Every day | RECTAL | Status: DC | PRN

## 2024-01-25 MED ORDER — GABAPENTIN 300 MG PO CAPS
300.0000 mg | ORAL_CAPSULE | Freq: Every day | ORAL | Status: DC
  Administered 2024-01-25 – 2024-01-26 (×2): 300 mg via ORAL
  Filled 2024-01-25 (×2): qty 1

## 2024-01-25 MED ORDER — COCONUT OIL OIL: 1.0000 | TOPICAL_OIL | Status: DC | PRN

## 2024-01-25 MED ORDER — DIPHENHYDRAMINE HCL 25 MG PO CAPS: 25.0000 mg | ORAL_CAPSULE | Freq: Four times a day (QID) | ORAL | Status: DC | PRN

## 2024-01-25 MED ORDER — ONDANSETRON HCL 4 MG/2ML IJ SOLN: 4.0000 mg | Freq: Three times a day (TID) | INTRAMUSCULAR | Status: DC | PRN

## 2024-01-25 MED ORDER — MENTHOL 3 MG MT LOZG: 1.0000 | LOZENGE | OROMUCOSAL | Status: DC | PRN

## 2024-01-25 MED ORDER — ACETAMINOPHEN 500 MG PO TABS
ORAL_TABLET | ORAL | Status: AC
  Filled 2024-01-25: qty 2

## 2024-01-25 MED ORDER — SIMETHICONE 80 MG PO CHEW
80.0000 mg | CHEWABLE_TABLET | Freq: Three times a day (TID) | ORAL | Status: DC
  Administered 2024-01-26 – 2024-01-27 (×5): 80 mg via ORAL
  Filled 2024-01-25 (×5): qty 1

## 2024-01-25 MED ORDER — NALOXONE HCL 0.4 MG/ML IJ SOLN: 0.4000 mg | INTRAMUSCULAR | Status: DC | PRN

## 2024-01-25 MED ORDER — PHENYLEPHRINE HCL-NACL 20-0.9 MG/250ML-% IV SOLN
INTRAVENOUS | Status: DC | PRN
  Administered 2024-01-25: 30 ug/min via INTRAVENOUS
  Administered 2024-01-25: 50 ug/min via INTRAVENOUS

## 2024-01-25 MED ORDER — LACTATED RINGERS IV SOLN: INTRAVENOUS | Status: DC

## 2024-01-25 MED ORDER — FENTANYL CITRATE (PF) 100 MCG/2ML IJ SOLN
INTRAMUSCULAR | Status: AC
  Filled 2024-01-25: qty 2

## 2024-01-25 MED ORDER — ACETAMINOPHEN 500 MG PO TABS: 1000.0000 mg | ORAL_TABLET | Freq: Four times a day (QID) | ORAL | Status: DC | PRN

## 2024-01-25 MED ORDER — IBUPROFEN 600 MG PO TABS: 600.0000 mg | ORAL_TABLET | Freq: Four times a day (QID) | ORAL | Status: DC

## 2024-01-25 MED ORDER — SODIUM CHLORIDE 0.9% FLUSH: 3.0000 mL | INTRAVENOUS | Status: DC | PRN

## 2024-01-25 MED ORDER — FENTANYL CITRATE (PF) 100 MCG/2ML IJ SOLN: 50.0000 ug | Freq: Once | INTRAMUSCULAR | Status: AC

## 2024-01-25 MED ORDER — OXYCODONE HCL 5 MG PO TABS
5.0000 mg | ORAL_TABLET | ORAL | Status: DC | PRN
  Administered 2024-01-26 – 2024-01-27 (×2): 5 mg via ORAL
  Filled 2024-01-25: qty 1
  Filled 2024-01-25: qty 2

## 2024-01-25 MED ORDER — PRENATAL MULTIVITAMIN CH
1.0000 | ORAL_TABLET | Freq: Every day | ORAL | Status: DC
  Administered 2024-01-26 – 2024-01-27 (×2): 1 via ORAL
  Filled 2024-01-25 (×2): qty 1

## 2024-01-25 MED ORDER — OXYTOCIN-SODIUM CHLORIDE 30-0.9 UT/500ML-% IV SOLN
INTRAVENOUS | Status: AC
Start: 2024-01-25 — End: 2024-01-25
  Filled 2024-01-25: qty 1000

## 2024-01-25 MED ORDER — NITROGLYCERIN 0.4 MG/SPRAY TL SOLN
Status: AC
Start: 2024-01-25 — End: ?
  Filled 2024-01-25: qty 4.9

## 2024-01-25 MED ORDER — MORPHINE SULFATE (PF) 0.5 MG/ML IJ SOLN
INTRAMUSCULAR | Status: AC
  Filled 2024-01-25: qty 10

## 2024-01-25 MED ORDER — SENNOSIDES-DOCUSATE SODIUM 8.6-50 MG PO TABS
2.0000 | ORAL_TABLET | ORAL | Status: DC
  Administered 2024-01-25 – 2024-01-26 (×2): 2 via ORAL
  Filled 2024-01-25 (×2): qty 2

## 2024-01-25 MED ORDER — MINERAL OIL LIGHT OIL
TOPICAL_OIL | Freq: Once | Status: AC
  Filled 2024-01-25: qty 10

## 2024-01-25 MED ORDER — METHYLERGONOVINE MALEATE 0.2 MG/ML IJ SOLN
INTRAMUSCULAR | Status: AC
  Filled 2024-01-25: qty 1

## 2024-01-25 SURGICAL SUPPLY — 32 items
BARRIER ADHS 3X4 INTERCEED (GAUZE/BANDAGES/DRESSINGS) IMPLANT
CHLORAPREP W/TINT 26 (MISCELLANEOUS) ×2 IMPLANT
DRESSING TELFA 8X10 (GAUZE/BANDAGES/DRESSINGS) ×1 IMPLANT
DRSG TELFA 3X8 NADH STRL (GAUZE/BANDAGES/DRESSINGS) ×2 IMPLANT
ELECT REM PT RETURN 9FT ADLT (ELECTROSURGICAL) ×2 IMPLANT
ELECTRODE REM PT RTRN 9FT ADLT (ELECTROSURGICAL) ×2 IMPLANT
GAUZE SPONGE 4X4 12PLY STRL (GAUZE/BANDAGES/DRESSINGS) ×2 IMPLANT
GAUZE SPONGE 4X4 8PLY NS (GAUZE/BANDAGES/DRESSINGS) ×1 IMPLANT
GOWN STRL REUS W/ TWL LRG LVL3 (GOWN DISPOSABLE) ×6 IMPLANT
MANIFOLD NEPTUNE II (INSTRUMENTS) ×2 IMPLANT
MAT PREVALON FULL STRYKER (MISCELLANEOUS) ×2 IMPLANT
NDL HYPO 25GX1X1/2 BEV (NEEDLE) ×1 IMPLANT
NEEDLE HYPO 25GX1X1/2 BEV (NEEDLE) ×2 IMPLANT
NS IRRIG 1000ML POUR BTL (IV SOLUTION) ×2 IMPLANT
PACK C SECTION AR (MISCELLANEOUS) ×2 IMPLANT
PAD OB MATERNITY 11 LF (PERSONAL CARE ITEMS) ×2 IMPLANT
PAD PREP OB/GYN DISP 24X41 (PERSONAL CARE ITEMS) ×2 IMPLANT
RETRACTOR TRAXI PANNICULUS (MISCELLANEOUS) ×1 IMPLANT
RTRCTR C-SECT PINK 25CM LRG (MISCELLANEOUS) ×1 IMPLANT
SCRUB CHG 4% DYNA-HEX 4OZ (MISCELLANEOUS) ×2 IMPLANT
STAPLER INSORB 30 2030 C-SECTI (MISCELLANEOUS) IMPLANT
SUT MNCRL 4-0 27 PS-2 XMFL (SUTURE) ×2 IMPLANT
SUT VIC AB 0 CT1 36 (SUTURE) ×4 IMPLANT
SUT VIC AB 0 CTX36XBRD ANBCTRL (SUTURE) ×4 IMPLANT
SUT VIC AB 2-0 SH 27XBRD (SUTURE) ×4 IMPLANT
SUT VIC AB 3-0 SH 27X BRD (SUTURE) ×1 IMPLANT
SUTURE MNCRL 4-0 27XMF (SUTURE) ×2 IMPLANT
SYR 30ML LL (SYRINGE) ×4 IMPLANT
TAPE MEDIFIX FOAM 3 (GAUZE/BANDAGES/DRESSINGS) ×1 IMPLANT
TAPE TRANSPORE STRL 2 31045 (GAUZE/BANDAGES/DRESSINGS) IMPLANT
TRAP FLUID SMOKE EVACUATOR (MISCELLANEOUS) ×2 IMPLANT
WATER STERILE IRR 500ML POUR (IV SOLUTION) ×2 IMPLANT

## 2024-01-25 NOTE — Discharge Summary (Signed)
 Obstetrical Discharge Summary  Patient Name: Kathy Benton DOB: November 14, 1990 MRN: [id number]  Date of Admission: 01/25/2024 Date of Delivery: 01/25/24 Delivered by: Christeen Douglas, MD MPH Date of Discharge: 01/27/2024  Primary OB:  One visit at 16wks at [facility]< and no care since  WUJ:[id number]'W last menstrual period was 04/27/2023. 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   Rubella non-immune status, antepartum   Gestational diabetes dx'd 09/05/23 at 18 wks   Breech presentation   Limited prenatal care in third trimester  Admitting Diagnosis: Uterine contractions [O47.9] Pregnancy [Z34.90]  Secondary Diagnosis: Patient Active Problem List   Diagnosis Date Noted   Status post cesarean delivery 01/27/2024   Uterine contractions 01/25/2024   Breech presentation 01/25/2024   Limited prenatal care in third trimester 01/25/2024   Pregnancy 01/25/2024   Gestational diabetes dx'd 09/05/23 at 18 wks 09/06/2023   Rubella non-immune status, antepartum 08/26/2023   Late prenatal care 16 5/7 wks 08/22/2023   Obesity affecting pregnancy BMI=34.7 08/22/2023   Supervision of high risk pregnancy in second trimester 08/22/2023   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 ECV successful, but fetus returned to breech. Nuchal cord x3 Delivery Type: primary cesarean section, low transverse incision Anesthesia: spinal anesthesia Placenta: spontaneous To Pathology: No  Laceration: n/a Episiotomy:  none Newborn Data: Live born female  Birth Weight: 7 lb 9 oz (3430 g) APGAR: 7, 9  Newborn Delivery   Birth date/time: 01/25/2024 16:42:00 Delivery type: C-Section, Low Transverse Trial of labor: No C-section categorization: Primary     Postpartum Procedures: none Edinburgh:     08/22/2023    9:17 AM 01/27/2021    2:28 PM 11/06/2020    8:28 AM  Edinburgh Postnatal Depression Scale Screening Tool  I have been able to laugh and see the funny side of things. 0 0 0  I have looked forward with enjoyment to things. 0 0 0  I have blamed myself unnecessarily when things went wrong. 0 0 0  I have been anxious or worried for no good reason. 2 0 0  I have felt scared or panicky for no good reason. 0 0 0  Things have been getting on top of me. 0 0 1  I have been so unhappy that I have had difficulty sleeping. 0 0 0  I have felt sad or miserable. 1 0 0  I have been so unhappy that I have been crying. 1 0 0  The thought of harming myself has occurred to me. 0 0 0  Edinburgh Postnatal Depression Scale Total 4 0 1     Post partum course: Patient had an uncomplicated postpartum course.  By time of discharge on POD#2, her pain was controlled on oral pain medications; she had appropriate lochia and was ambulating, voiding without difficulty, tolerating regular diet and passing flatus.   She was deemed stable for discharge to home.    Discharge Physical Exam:  BP 117/71 (BP Location: Right Arm)   Pulse 94   Temp 98.9 F (37.2 C) (Oral)  Resp 18   Ht 4\' 11"  (1.499 m)   Wt 83.5 kg   LMP 04/27/2023   SpO2 99%   Breastfeeding Unknown   BMI 37.16 kg/m   General: NAD CV: RRR Pulm: nl effort ABD: s/nd/nt, fundus firm and below the umbilicus Lochia: moderate Perineum:minimal edema/intact Incision: c/d/I, covered with occlusive OP site dressing  DVT Evaluation: LE non-ttp, no evidence of DVT on exam.  Hemoglobin  Date Value Ref Range Status  01/26/2024 9.8 (L) 12.0 - 15.0 g/dL Final   16/08/9603 54.0 11.1 - 15.9 g/dL Final   HCT  Date Value Ref Range Status  01/26/2024 28.8 (L) 36.0 - 46.0 % Final   Hematocrit  Date Value Ref Range Status  08/22/2023 37.3 34.0 - 46.6 % Final    Risk assessment for postpartum VTE and prophylactic treatment: Very high risk factors: None High risk factors: None Moderate risk factors: None  Postpartum VTE prophylaxis with LMWH not indicated  Disposition: stable, discharge to home. Baby Feeding: breast feeding Baby Disposition: home with mom  Rh Immune globulin indicated: No Rubella vaccine given: was given Varivax vaccine given: was not indicated Flu vaccine given in AP setting: No Tdap vaccine given in AP setting: No  Contraception: bilateral tubal ligation completed  Prenatal Labs:  Blood type/Rh O pos   Antibody screen Negative    Rubella 0.96 (10/21 1059)   Varicella NR  RPR Non Reactive (10/21 1059)   HBsAg Negative (10/21 1059)  Hep C NR   HIV NON REACTIVE (03/26 0935)   GC neg  Chlamydia neg  Genetic screening cfDNA negative   1 hour GTT 191  3 hour GTT 80, 204, 179, 96  GBS PRESUMPTIVE NEGATIVE/-- (03/26 0834)     Plan:  Oni Dietzman was discharged to home in good condition.   Discharge Medications: Allergies as of 01/27/2024   No Known Allergies      Medication List     STOP taking these medications    aspirin EC 81 MG tablet   Blood Glucose Monitoring Suppl Devi   BLOOD GLUCOSE TEST STRIPS Strp   Lancets Misc       TAKE these medications    acetaminophen 500 MG tablet Commonly known as: TYLENOL Take 1,000 mg by mouth every 6 (six) hours as needed.   ferrous sulfate 325 (65 FE) MG tablet Take 1 tablet (325 mg total) by mouth 2 (two) times daily with a meal.   ibuprofen 600 MG tablet Commonly known as: ADVIL Take 1 tablet (600 mg total) by mouth every 6 (six) hours.   oxyCODONE 5 MG immediate release tablet Commonly known as: Oxy IR/ROXICODONE Take 1 tablet (5 mg total)  by mouth every 6 (six) hours as needed for moderate pain (pain score 4-6).   PRENATAL PO Take 1 tablet by mouth daily.         Follow-up Information     Christeen Douglas, MD Follow up today.   Specialty: Obstetrics and Gynecology Why: For postop check Contact information: [address] [place] [address] [phone]                 Signed: Cyril Mourning 01/27/2024 8:59 AM

## 2024-01-25 NOTE — Progress Notes (Signed)
 Final dx: unstable lie Fetal positioning unable to be stable cephalic, despite a successful external cephalic version and belly binder. We discussed trial of induction wth controlled AROM if we can get her cephalic, but risks discussed of cord prolapse and possibility of c-section after labor.   Cat I strip  The risks of cesarean section discussed with the patient included but were not limited to: bleeding which may require transfusion or reoperation; infection which may require antibiotics; injury to bowel, bladder, ureters or other surrounding organs; injury to the fetus; need for additional procedures including hysterectomy in the event of a life-threatening hemorrhage; placental abnormalities wth subsequent pregnancies, incisional problems, thromboembolic phenomenon and other postoperative/anesthesia complications. The patient concurred with the proposed plan, giving informed written consent for the procedure.   Patient has been NPO since yesterday she will remain NPO for procedure. Anesthesia and OR aware. Preoperative prophylactic antibiotics and SCDs ordered on call to the OR.  To OR when ready.  Plan for BTL at her request. Last pregnancy she requested sterilization but did not get it since she also was covid positive at the time. She is a competent adult requesting sterilization. She knows it is a permanent procedure and that she will not get spontaneously pregnant in the future. She requests it.  Patient desires permanent sterilization.  Other reversible forms of contraception were discussed with patient; she declines all other modalities. Risks of procedure discussed with patient including but not limited to: risk of regret, permanence of method, bleeding, infection, injury to surrounding organs and need for additional procedures.  Failure risk of 1-2 % with increased risk of ectopic gestation if pregnancy occurs was also discussed with patient.  Patient verbalized understanding of these risks  and wants to proceed with sterilization.  Written informed consent obtained.   Tubal ligation

## 2024-01-25 NOTE — H&P (Signed)
 OB History & Physical   History of Present Illness:   Chief Complaint: contractions   HPI:  Kathy Benton is a 33 y.o. [id number] female at [redacted]w[redacted]d, Patient's last menstrual period was 04/27/2023., consistent with [place] at [redacted]w[redacted]d, with Estimated Date of Delivery: 02/01/24.  She presents to L&D for contractions that started this morning.  Her pregnancy is complicated by gestational diabetes, obesity, AMA, and limited prenatal care . She had an initial prenatal visit at  16 weeks and then an anatomy US at 20 weeks. She has not received prenatal care since that time. She denies Loss of fluid or Vaginal bleeding. Endorses fetal movement as active.   Reports active fetal movement  Contractions:  irregular contractions since 0300 today  LOF/SROM: denies  Vaginal bleeding: denies   Factors complicating pregnancy:  Principal Problem:   Uterine contractions Active Problems:   History of hypertension--CHTN dx'd 2008; 146/93 on 07/21/23   Late prenatal care 16 5/7 wks   Obesity affecting pregnancy BMI=34.7   Grand multiparity G7P5   Rubella non-immune status, antepartum   Gestational diabetes dx'd 09/05/23 at 18 wks   Breech presentation   Limited prenatal care in third trimester    Prenatal care site:  [facility]  Patient Active Problem List   Diagnosis Date Noted   Uterine contractions 01/25/2024   Breech presentation 01/25/2024   Limited prenatal care in third trimester 01/25/2024   Gestational diabetes dx'd 09/05/23 at 18 wks 09/06/2023   Rubella non-immune status, antepartum 08/26/2023   Late prenatal care 16 5/7 wks 08/22/2023   Obesity affecting pregnancy BMI=34.7 08/22/2023   Supervision of high risk pregnancy in second trimester 08/22/2023   Grand multiparity G7P5 08/22/2023   History of gestational diabetes in 2 prior pregnancies with noncompliance 08/22/2023   History of hypertension--CHTN dx'd 2008; 146/93 on 07/21/23 06/27/2020    Prenatal Transfer Tool  Maternal Diabetes: Yes:  Diabetes  Type:  Diet controlled Genetic Screening: Normal Maternal Ultrasounds/Referrals: Normal Fetal Ultrasounds or other Referrals:  None Maternal Substance Abuse:  No Significant Maternal Medications:  None Significant Maternal Lab Results: Group B Strep negative  Maternal Medical History:   Past Medical History:  Diagnosis Date   1 hour glucola=191 08/22/23 08/29/2023   Needs 3 hour GT11/4/24=diabetic  Needs transfer of care out     Gestational diabetes 2019   2019 pregnancy, 2022 pregnancy   Headache    migraines   Hypertension    2019 and 2022 pregnancy    Past Surgical History:  Procedure Laterality Date   CHOLECYSTECTOMY, LAPAROSCOPIC  12/19/2020   ERCP N/A 12/18/2020   Procedure: ENDOSCOPIC RETROGRADE CHOLANGIOPANCREATOGRAPHY (ERCP);  Surgeon: Midge Minium, MD;  Location[facility] END[facility]COPY;  Service: Endoscopy;  Laterality: N/A;    No Known Allergies  Prior to Admission medications   Medication Sig Start Date End Date Taking? Authorizing Provider  Blood Glucose Monitoring Suppl DEVI 1 each by Does not apply route in the morning, at noon, and at bedtime. May substitute to any manufacturer covered by patient's insurance. 09/06/23   Sciora, Austin Miles, CNM  Glucose Blood (BLOOD GLUCOSE TEST STRIPS) STRP 100 each by In Vitro route in the morning, at noon, in the evening, and at bedtime. 09/06/23   Sciora, Austin Miles, CNM  Lancets MISC 1 Units by Does not apply route in the morning, at noon, in the evening, and at bedtime. Dispense insurance approves, Medicaid approved or least expensive lancets 09/06/23   Sciora, Austin Miles, CNM  acetaminophen (TYLENOL) 500 MG tablet  Take 1,000 mg by mouth every 6 (six) hours as needed. Patient not taking: Reported on 09/05/2023    [provider]  aspirin EC 81 MG tablet Take 81 mg by mouth daily. Swallow whole.    [provider]  Prenatal Vit-Fe Fumarate-FA (PRENATAL PO) Take 1 tablet by mouth daily.    [provider]     OB History  Gravida Para Term Preterm AB Living  7 5 5  0 1 5  SAB IAB Ectopic Multiple Live Births  0 0 0 0 5    # Outcome Date GA Lbr Len/2nd Weight Sex Type Anes PTL Lv  7 Current           6 Term 11/05/20 [redacted]w[redacted]d  3510 g F Vag-Spont None  LIV     Name: Jorden,GIRL Dessire     Apgar1: 4  Apgar5: 5  5 Term 06/24/18 [redacted]w[redacted]d  3050 g F Vag-Spont EPI  LIV     Birth Comments: dark pigmentation noted neath Left Armpit     Name: Jillson,GIRL Annajulia     Apgar1: 9  Apgar5: 9  4 AB 04/04/12          3 Term 08/02/07 [redacted]w[redacted]d  3374 g M Vag-Spont   LIV  2 Term 05/07/06 [redacted]w[redacted]d  3289 g F Vag-Spont   LIV  1 Term 01/02/05 [redacted]w[redacted]d  3345 g M Vag-Spont   LIV     Social History: She  reports that she has never smoked. She has never been exposed to tobacco smoke. She has never used smokeless tobacco. She reports that she does not currently use alcohol after a past usage of about 2.0 standard drinks of alcohol per week. She reports that she does not currently use drugs after having used the following drugs: Marijuana.  Family History: family history includes Dementia in her maternal grandmother; Healthy in her brother, daughter, daughter, daughter, father, maternal grandfather, paternal grandfather, sister, sister, son, and son; Hypertension in her mother; Premature birth in her sister.   Review of Systems: A full review of systems was performed and negative except as noted in the HPI.     Physical Exam:  Vital Signs: BP 108/74   Pulse 69   Temp 98.4 F (36.9 C) (Oral)   Resp 18   Ht 4\' 11"  (1.499 m)   Wt 83.5 kg   LMP 04/27/2023   BMI 37.16 kg/m   General: no acute distress.  HEENT: normocephalic, atraumatic Heart: regular rate & rhythm Lungs: normal respiratory effort Abdomen: soft, gravid, non-tender;  EFW: 3300 grams Pelvic:   External: Normal external female genitalia  Cervix: Dilation: 1 / Effacement (%): Thick / Station: Ballotable    Extremities: non-tender, symmetric, no edema  bilaterally.  DTRs: 2+/2+  Neurologic: Alert & oriented x 3.    Results for orders placed or performed during the hospital encounter of 01/25/24 (from the past 24 hours)  Glucose, capillary     Status: Abnormal   Collection Time: 01/25/24  8:33 AM  Result Value Ref Range   Glucose-Capillary 102 (H) 70 - 99 mg/dL  Group B strep by PCR     Status: None   Collection Time: 01/25/24  8:34 AM   Specimen: Vaginal/Rectal; Genital  Result Value Ref Range   Group B strep by PCR PRESUMPTIVE NEGATIVE PRESUMPTIVE NEGATIVE  Chlamydia/NGC rt PCR ([facility] only)     Status: None   Collection Time: 01/25/24  9:02 AM   Specimen: Urine; GU  Result Value Ref  Range   Specimen source GC/Chlam URINE, RANDOM    Chlamydia Tr NOT DETECTED NOT DETECTED   N gonorrhoeae NOT DETECTED NOT DETECTED  Urine Drug Screen, Qualitative ([facility] only)     Status: None   Collection Time: 01/25/24  9:02 AM  Result Value Ref Range   Tricyclic, Ur Screen NONE DETECTED NONE DETECTED   Amphetamines, Ur Screen NONE DETECTED NONE DETECTED   MDMA (Ecstasy)Ur Screen NONE DETECTED NONE DETECTED   Cocaine Metabolite,Ur [place] NONE DETECTED NONE DETECTED   Opiate, Ur Screen NONE DETECTED NONE DETECTED   Phencyclidine (PCP) Ur S NONE DETECTED NONE DETECTED   Cannabinoid 50 Ng, Ur [place] NONE DETECTED NONE DETECTED   Barbiturates, Ur Screen NONE DETECTED NONE DETECTED   Benzodiazepine, Ur Scrn NONE DETECTED NONE DETECTED   Methadone Scn, Ur NONE DETECTED NONE DETECTED  CBC     Status: None   Collection Time: 01/25/24  9:35 AM  Result Value Ref Range   WBC 9.0 4.0 - 10.5 K/uL   RBC 4.01 3.87 - 5.11 MIL/uL   Hemoglobin 12.5 12.0 - 15.0 g/dL   HCT 16.1 09.6 - 04.5 %   MCV 90.5 80.0 - 100.0 fL   MCH 31.2 26.0 - 34.0 pg   MCHC 34.4 30.0 - 36.0 g/dL   RDW 40.9 81.1 - 91.4 %   Platelets 225 150 - 400 K/uL   nRBC 0.0 0.0 - 0.2 %  Rapid HIV screen (HIV 1/2 Ab+Ag)     Status: None   Collection Time: 01/25/24  9:35 AM  Result Value Ref Range    HIV-1 P24 Antigen - HIV24 NON REACTIVE NON REACTIVE   HIV 1/2 Antibodies NON REACTIVE NON REACTIVE   Interpretation (HIV Ag Ab)      A non reactive test result means that HIV 1 or HIV 2 antibodies and HIV 1 p24 antigen were not detected in the specimen.    Pertinent Results:  Prenatal Labs: Blood type/Rh O pos   Antibody screen Negative    Rubella 0.96 (10/21 1059)   Varicella Pending  RPR Non Reactive (10/21 1059)   HBsAg Negative (10/21 1059)  Hep C NR   HIV NON REACTIVE (03/26 0935)   GC neg  Chlamydia neg  Genetic screening cfDNA negative   1 hour GTT 191  3 hour GTT 80, 204, 179, 96  GBS PRESUMPTIVE NEGATIVE/-- (03/26 0834)    FHT:  FHR: 135 bpm, variability: moderate,  accelerations:  Present,  decelerations:  Absent Category/reactivity:  Category I UC:   Irregular, mild contractions    Breech by [place]   US OB Comp + 14 Wk Result Date: 01/25/2024 CLINICAL DATA:  [id number] Limited prenatal care in third trimester [id number] Gestational diabetes 108040 EXAM: OBSTETRICAL ULTRASOUND >14 WKS FINDINGS: Number of Fetuses: 1 Heart Rate:  140 bpm Movement: Yes Presentation: Breech Previa: No Placental Location: Posterior Amniotic Fluid (Subjective): Normal Amniotic Fluid (Objective): AFI = 12.3 cm FETAL BIOMETRY BPD: 8.7cm 35w 0d HC:   31.2cm 34w 6d AC:   34.9cm 38w 5d FL:   7.6cm 39w 0d Current Mean GA: 36w 6d [place] EDC: 02/16/2024 Estimated Fetal Weight:  3,325g FETAL ANATOMY Lateral Ventricles: Not visualized Thalami/CSP: Appears normal Posterior Fossa:  Not visualized Nuchal Region: Not visualized Upper Lip: Appears normal Spine: Limited views appear normal 4 Chamber Heart on Left: Appears normal LVOT: Not visualized RVOT: Not visualized Stomach on Left: Appears normal 3 Vessel Cord: Appears normal Cord Insertion site: Not visualized Kidneys: Appears normal  Bladder: Appears normal Extremities: Not visualized Sex: Not Visualized Technically difficult due to: Advanced gestational age  Maternal Findings: Cervix:  Closed.  3.0 cm IMPRESSION: 1. Single live intrauterine gestation in breech presentation. 2. Current mean gestational age of [redacted] weeks 6 days. 3. Limited fetal anatomic survey secondary to advanced gestational age. Electronically Signed   By: Duanne Guess D.O.   On: 01/25/2024 11:34    Assessment:  Kathy Benton is a 33 y.o. K2H0623 female at [redacted]w[redacted]d with limited prenatal care, gestational diabetes, and breech at term  Plan:  1. Admit to Labor & Delivery - Admission status: Inpatient - Dr Virgel Manifold MD notified of admission and plan of care  - Reason for admission: delivery management  - consents reviewed and obtained  2. Fetal Well being  - Fetal Tracing: cat 1 - Group B Streptococcus ppx not indicated: GBS negative - Presentation: breech confirmed by [place] 01/25/2024   3. Routine OB: - Prenatal labs reviewed, as above - Rh positive - CBC, T&S, RPR on admit - NPO, saline lock  4. Breech presentation - Discussed risk/benefits of ECV versus primary c/section  - At this time, Kathy Benton desires to attempt ECV - Would recommend proceeding with induction of labor if ECV successful - Sonal consents to primary c/section for breech presentation if ECV is not successful  - Dr. Dalbert Garnet notified of patient desires. Provider to evaluate.   5. Gestational diabetes  - Limited prenatal care, followed GDM diet at home  - Fasting CBG 102 - Currently NPO  - EFW today 3325 grams and AFI 12.3 cm  - Pelvis proven to 3510 grams   6. Post Partum Planning: - Infant feeding: breast feeding - Contraception: bilateral tubal ligation - Flu vaccine: Given prenatally - Tdap vaccine:  Not given  - RSV vaccine: Not in season   Gustavo Lah, [place] 01/25/24 12:22 PM  Margaretmary Eddy, CNM Certified Nurse Midwife [facility]

## 2024-01-25 NOTE — Anesthesia Preprocedure Evaluation (Signed)
 Anesthesia Evaluation  Patient identified by MRN, date of birth, ID band Patient awake    Reviewed: Allergy & Precautions, H&P , NPO status , Patient's Chart, lab work & pertinent test results  Airway Mallampati: II  TM Distance: >3 FB Neck ROM: full    Dental no notable dental hx.    Pulmonary neg pulmonary ROS   Pulmonary exam normal        Cardiovascular Exercise Tolerance: Good hypertension, Normal cardiovascular exam     Neuro/Psych    GI/Hepatic negative GI ROS,,,  Endo/Other  diabetes, Well Controlled, Type 2    Renal/GU   negative genitourinary   Musculoskeletal   Abdominal  (+) + obese  Peds  Hematology negative hematology ROS (+)   Anesthesia Other Findings Past Medical History: 08/29/2023: 1 hour glucola=191 08/22/23     Comment:  Needs 3 hour GT11/4/24=diabetic  Needs transfer of care               out   2019: Gestational diabetes     Comment:  2019 pregnancy, 2022 pregnancy No date: Headache     Comment:  migraines No date: Hypertension     Comment:  2019 and 2022 pregnancy  Past Surgical History: 12/19/2020: CHOLECYSTECTOMY, LAPAROSCOPIC 12/18/2020: ERCP; N/A     Comment:  Procedure: ENDOSCOPIC RETROGRADE               CHOLANGIOPANCREATOGRAPHY (ERCP);  Surgeon: Midge Minium,               MD;  Location: [facility] ENDOSCOPY;  Service: Endoscopy;                Laterality: N/A;  BMI    Body Mass Index: 37.16 kg/m      Reproductive/Obstetrics (+) Pregnancy                              Anesthesia Physical Anesthesia Plan  ASA: 3  Anesthesia Plan: Spinal   Post-op Pain Management:    Induction: Intravenous  PONV Risk Score and Plan: Ondansetron  Airway Management Planned:   Additional Equipment:   Intra-op Plan:   Post-operative Plan:   Informed Consent: I have reviewed the patients History and Physical, chart, labs and discussed the procedure including  the risks, benefits and alternatives for the proposed anesthesia with the patient or authorized representative who has indicated his/her understanding and acceptance.     Dental Advisory Given  Plan Discussed with: Anesthesiologist and CRNA  Anesthesia Plan Comments:          Anesthesia Quick Evaluation

## 2024-01-25 NOTE — Transfer of Care (Signed)
 Immediate Anesthesia Transfer of Care Note  Patient: Kathy Benton  Procedure(s) Performed: CESAREAN SECTION, WITH BILATERAL TUBAL LIGATION (Bilateral)  Patient Location: PACU and Mother/Baby  Anesthesia Type:Spinal  Level of Consciousness: awake, alert , and oriented  Airway & Oxygen Therapy: Patient Spontanous Breathing  Post-op Assessment: Report given to RN and Post -op Vital signs reviewed and stable  Post vital signs: Reviewed and stable  Last Vitals:  Vitals Value Taken Time  BP 120/85 01/25/2024 1732  Temp    Pulse 70   Resp 24   SpO2 100     Last Pain:  Vitals:   01/25/24 1416  TempSrc:   PainSc: 0-No pain         Complications: No notable events documented.

## 2024-01-25 NOTE — Progress Notes (Signed)
 Dr. Dalbert Garnet at bedside at 1415 for ECV. ECV started at 1418

## 2024-01-25 NOTE — Lactation Note (Signed)
 This note was copied from a baby's chart. Lactation Consultation Note  Patient Name: Kathy Benton [id number] Date: 01/25/2024 Age:35 hours Reason for consult: Initial assessment;Difficult latch;RN request;Term   Maternal Data Has patient been taught Hand Expression?: Yes Does the patient have breastfeeding experience prior to this delivery?: Yes (5 other children for various lengths of time. youngest is 35 years old)  Feeding Mother's Current Feeding Choice: Breast Milk Nipple Type: Slow - flow  LATCH Score Latch: Repeated attempts needed to sustain latch, nipple held in mouth throughout feeding, stimulation needed to elicit sucking reflex.  Audible Swallowing: Spontaneous and intermittent  Type of Nipple: Flat  Comfort (Breast/Nipple): Soft / non-tender  Hold (Positioning): Assistance needed to correctly position infant at breast and maintain latch.  LATCH Score: 7   Lactation Tools Discussed/Used    Interventions Interventions: Breast feeding basics reviewed;Assisted with latch;Support pillows;Breast compression;Infant Driven Feeding Algorithm education  Mom attempted latch in L&D. LC met with G7P6 mom and baby at 5 hours of life post blood sugar of 78. After several attempts at latch and efforts to wake baby, latch with numerous audible swallows achieved in football hold on right side for 9-10 minutes. Stimulation needed throughout to keep infant awake. Attempted feeding on left side in football hold. Education offered about 8-12 feeds in 24 hours, expected pattern of output over the first week, waking techniques, and signs of a good latch. Report given to RN.  Discharge Pump: Personal WIC Program: Yes  Consult Status Consult Status: Follow-up Follow-up type: In-patient    Kathy Benton 01/25/2024, 10:14 PM

## 2024-01-25 NOTE — Op Note (Signed)
 Cesarean Section Procedure Note  Date of procedure: 01/25/2024   Pre-operative Diagnosis: Intrauterine pregnancy at [redacted]w[redacted]d;  - malpresentation (transverse) - insufficient prenatal care - cHTN - gDM with unknown control - Rh neg - Desires sterilization  Post-operative Diagnosis: same, delivered.  Procedure: Primary Low Transverse Cesarean Section through Pfannenstiel incision with BTL  Surgeon: Christeen Douglas, MD  Assistant(s):  Margaretmary Eddy, CNM   An experienced assistant was required given the standard of surgical care given the complexity of the case.  This assistant was needed for exposure, dissection, suctioning, retraction, instrument exchange,  CNM assisting with delivery with administration of fundal pressure, and for overall help during the procedure.   Anesthesia: Local anesthesia 0.25.% bupivacaine and Spinal anesthesia  Anesthesiologist: Foye Deer, MD Anesthesiologist: Foye Deer, MD CRNA: Maryla Morrow., CRNA; Tyrell Antonio B, CRNA  Estimated Blood Loss:           Drains: Foley         Total IV Fluids:  Urine Output: 20ml         Specimens: cord blood for rh neg         Complications:  None; patient tolerated the procedure well.         Disposition: PACU - hemodynamically stable.         Condition: stable  Findings:  A female infant in transverse presentation. Amniotic fluid - Meconium  Birth weight 3430 g.   APGAR (1 MIN): 7  APGAR (5 MINS): 9  APGAR (10 MINS):     Intact placenta with a three-vessel cord.  Grossly normal uterus, tubes and ovaries bilaterally. NO intraabdominal adhesions were noted. Nuchal cord x3, body cord x1  Indications: malpresentation:    Procedure Details  The patient was taken to Operating Room, identified as the correct patient and the procedure verified as C-Section Delivery. A formal Time Out was held with all team members present and in agreement.  After induction of  anesthesia, the patient was draped and prepped in the usual sterile manner. A Pfannenstiel skin incision was made and carried down through the subcutaneous tissue to the fascia. Fascial incision was made and extended transversely with the Mayo scissors. The fascia was separated from the underlying rectus tissue superiorly and inferiorly. The peritoneum was identified and entered bluntly. Peritoneal incision was extended longitudinally. The utero-vesical peritoneal reflection was incised transversely and a bladder flap was created digitally.   A low transverse hysterotomy was made. The fetus was delivered atraumatically. The umbilical cord was clamped x2 and cut and the infant was handed to the awaiting pediatricians. The placenta was removed intact and appeared normal, intact, and with a 3-vessel cord.   The uterus was exteriorized and cleared of all clot and debris. The hysterotomy was closed with running sutures of 0-Vicryl. A second imbricating layer was placed with the same suture. Excellent hemostasis was observed. The left and right Fallopian tubes were removed by total salpingectomy, including fimbriated ends with 1 chromic. The peritoneal cavity was cleared of all clots and debris. The uterus was returned to the abdomen.   Gutters and pelvis were evaluated and excellent hemostasis was noted. The fascia was then reapproximated with running sutures of 0 Maxon.  The subcutaneous tissue was reapproximated with running sutures of 0 Vicryl. The skin was reapproximated with Ensorb absorbable staples. 50 of 0.25% bupivicaine was placed in the fascial and skin lines.  Instrument, sponge, and needle counts were correct prior to the abdominal closure and at  the conclusion of the case.   The patient tolerated the procedure well and was transferred to the recovery room in stable condition.   Christeen Douglas, MD 01/25/2024

## 2024-01-25 NOTE — OB Triage Note (Signed)
 Patient is Kathy Benton is seen at [facility] health department, has had previous children with [facility]. Patient c/o CTX since around 3 am and have been intermittent and rates them not very strong. Patient denies LOF or s/s of UTI. Patient VSS and provider to be notified.

## 2024-01-25 NOTE — Progress Notes (Signed)
 Patient ID: Kathy Benton, female   DOB: 12/14/1990, 33 y.o.   MRN: [id number]  Kathy Benton [id number] at [redacted]w[redacted]d with breech presentation who desires external cephalic version.  Preop: transverse presentation, head to maternal right Postop: cephalic, but after 15 min with a belly binder,  presentation transverse with head to maternal left  External cephalic version attempted with mineral oil x3. 50 Iv fentanyl given. Fetal heart rate documented in the normal baseline range throughout procedure. Baby turned to cephalic, but moved 180 deg from initial presentation 15 min after finishing procedure.  Baby now cephalic to maternal left transverse with spine down.

## 2024-01-25 NOTE — Progress Notes (Signed)
Patient wheeled down to ultrasound.

## 2024-01-25 NOTE — Anesthesia Procedure Notes (Signed)
 Spinal  Patient location during procedure: OR Start time: 01/25/2024 4:05 PM End time: 01/25/2024 4:15 PM Reason for block: surgical anesthesia Staffing Performed: resident/CRNA  Anesthesiologist: Foye Deer, MD Resident/CRNA: Maryla Morrow., CRNA Performed by: Maryla Morrow., CRNA Authorized by: Foye Deer, MD   Preanesthetic Checklist Completed: patient identified, IV checked, site marked, risks and benefits discussed, surgical consent, monitors and equipment checked, pre-op evaluation and timeout performed Spinal Block Patient position: sitting Prep: Betadine Patient monitoring: heart rate, continuous pulse ox, blood pressure and cardiac monitor Approach: midline Location: L4-5 Injection technique: single-shot Needle Needle type: Whitacre and Introducer  Needle gauge: 24 G Needle length: 9 cm Assessment Events: CSF return Additional Notes Negative paresthesia. Negative blood return. Positive free-flowing CSF. Expiration date of kit checked and confirmed. Patient tolerated procedure well, without complications.

## 2024-01-25 NOTE — Progress Notes (Signed)
 ECV discussed, including risks (58% success rate, small chance of spontaneous return to breech, AROM, placental abruption, NRFHT and stat c/s, maternal discomfort), benefits (chance at a vaginal birth) and alternatives (expectant management, cesarean section.)  She elects to proceed with procedure. She will get a fluid bolus prior to start of procedure.

## 2024-01-26 ENCOUNTER — Encounter: Payer: Self-pay | Admitting: Obstetrics and Gynecology

## 2024-01-26 LAB — CBC
HCT: 28.8 % — ABNORMAL LOW (ref 36.0–46.0)
Hemoglobin: 9.8 g/dL — ABNORMAL LOW (ref 12.0–15.0)
MCH: 30.9 pg (ref 26.0–34.0)
MCHC: 34 g/dL (ref 30.0–36.0)
MCV: 90.9 fL (ref 80.0–100.0)
Platelets: 192 10*3/uL (ref 150–400)
RBC: 3.17 MIL/uL — ABNORMAL LOW (ref 3.87–5.11)
RDW: 13.5 % (ref 11.5–15.5)
WBC: 9.2 10*3/uL (ref 4.0–10.5)
nRBC: 0 % (ref 0.0–0.2)

## 2024-01-26 LAB — VARICELLA ZOSTER ANTIBODY, IGG: Varicella IgG: NONREACTIVE

## 2024-01-26 MED ORDER — KETOROLAC TROMETHAMINE 30 MG/ML IJ SOLN
30.0000 mg | Freq: Four times a day (QID) | INTRAMUSCULAR | Status: AC
  Administered 2024-01-26 (×3): 30 mg via INTRAVENOUS
  Filled 2024-01-26 (×3): qty 1

## 2024-01-26 MED ORDER — ACETAMINOPHEN 500 MG PO TABS
1000.0000 mg | ORAL_TABLET | Freq: Four times a day (QID) | ORAL | Status: DC
  Administered 2024-01-26 – 2024-01-27 (×4): 1000 mg via ORAL
  Filled 2024-01-26 (×4): qty 2

## 2024-01-26 MED ORDER — IBUPROFEN 600 MG PO TABS
600.0000 mg | ORAL_TABLET | Freq: Four times a day (QID) | ORAL | Status: DC
  Administered 2024-01-27: 600 mg via ORAL
  Filled 2024-01-26: qty 1

## 2024-01-26 NOTE — Clinical Social Work Maternal (Signed)
 CLINICAL SOCIAL WORK MATERNAL/CHILD NOTE  Patient Details  Name: Kathy Benton MRN: [id number] Date of Birth: 08-25-1991  Date:  01/26/2024  Clinical Social Worker Initiating Note:  Jonetta Speak, RN, BSN, CM Date/Time: Initiated:  01/26/24/      Child's Name:  Kathy Benton Parents:  Mother   Need for Interpreter:  None   Reason for Referral:  Other (Comment) (No insurance)   Address:  [address] [place] [address]    Phone number:  [phone] (home)     Additional phone number: [phone]  Household Members/Support Persons (HM/SP):    (Patient has 6 children ages 75, 48, 70, 81, 3, and newborn. FOB also live with mother and children.)   HM/SP Name Relationship DOB or Age  HM/SP -1        HM/SP -2        HM/SP -3        HM/SP -4        HM/SP -5        HM/SP -6        HM/SP -7        HM/SP -8          Natural Supports (not living in the home):  Immediate Family (Mother and sister will help with baby)   Professional Supports:     Employment: Unemployed   Type of Work:   N/a  Education:  9 to 11 years   Homebound arranged:    Surveyor, quantity Resources:      Other Resources:  Sales executive  , [facility]   Cultural/Religious Considerations Which May Impact Care:  None  Strengths:  Ability to meet basic needs  , Home prepared for child  , Pediatrician chosen   Psychotropic Medications:         Pediatrician:    [facility]  Pediatrician List:   [facility]    [facility] ([facility]  [facility]      Pediatrician Fax Number:    Risk Factors/Current Problems:  Other (Comment) (No Health insurance)   Cognitive State:  Alert     Mood/Affect:  Happy     CSW Assessment:  Chart reviewed.  I have meet with patient at bedside today.  Kathy Benton did not have any visitors in the room and was caring for the baby upon arrival to the room.  I have introduced myself and my role as  Case Production designer, theatre/television/film.  I have informed Kathy Benton that I have received consult for patient not having insurance.  Kathy Benton does inform me that she currently does not have insurance for herself or the baby.  Kathy Benton informs me that she has spoken with Financial Counsellor Fleet Contras would will be assist with submitting Medicaid application for Kathy Benton and the baby.  Kathy Benton informs me that she has 6 children and her children all receive Medicaid.  I have informed Kathy Benton it will take about 45-60 for Medicaid approval for patient and the baby.  I have given  Kathy Benton information on [employer] for medical needs until [place] has been approved.  I have also given her [facility] application.  I have informed Kathy Benton that [employer] can provide medical, and dental needs for patient's that do not have insurance.  I have also made Kathy Benton aware that [facility] also has pharmacy for patients  to be able to receive medications for patient's that do not have insurance.     Kathy Benton informs me that she is feeling good but yet tired about the birth of the baby.  She confirms that her address is [address] [place], [place] [address].  She reports that her phone number is [phone].  FOB name is Kathy Benton and his contact number is [phone].    Kathy Benton informs me that her mother and sister will her support with the baby.  She reports that he husband will be working but will assist in the evenings.  Kathy Benton denies any Mental Health history. She denies any suicidal/ homicidal thoughts.  She denies any domestic violence.    I have received Postpartum Depression and Sudden Infant Death Syndrome and Psychiatrist.  Kathy Benton informs me that she has chosen to use [facility] for the baby's Pediatrician.  Kathy Benton informs me that she has a bassinet, diapers, clothes, and a car seat for the baby.  Kathy Benton confirms she has transportation.    If will have  prescription needs on discharge we can ask pharmacy to assist with meds to beds.    Kathy Benton did not have any other concerns or needs at this time.    CSW Plan/Description:  Sudden Infant Death Syndrome (SIDS) Education, Other Restaurant manager, fast food, Other Information/Referral to [employer], RN 01/26/2024, 3:54 PM

## 2024-01-26 NOTE — Lactation Note (Signed)
 This note was copied from a baby's chart. Lactation Consultation Note  Patient Name: Girl Jaira Canady Today's Date: 01/26/2024 Age:35 hours     Maternal Data   MOB holding swaddled sleeping infant upon [facility] room entry. MOB is concerned that the baby isn't feeding for long enough intervals of time, as baby is becoming drowsy and difficult to maintain effective latch at breast. No pain or tenderness reported at this time. Feeding Nipple Type: Slow - flow    Lactation Tools Discussed/Used   LC provided reassurance of normative infant behavior within first 24 hours of life and that offering breast upon infant demonstration of hunger cues facilitates maturation of milk supply. Education provided on infant stomach size capacity and preparation for cluster feeding period, with recommendation to unswaddle and undress infant to assist in rousing for feeds as needed.   Interventions  Recommended to contact [facility] office for assessment of feed upon next attempt. Reviewed early hunger cues and utilizing diaper output as indicator of intake, as well as transition of milk and maturation of supply dependency upon effective milk removal.   Follow up to determine plan of action regarding feeding moving forward and if EBF or combo feeding upon discharge.    Consult Status   Follow-up  Lennie Hummer 01/26/2024, 3:59 PM

## 2024-01-26 NOTE — Lactation Note (Signed)
 This note was copied from a baby's chart. Lactation Consultation Note  Patient Name: Kathy Benton ZOXWR'U Date: 01/26/2024 Age:35 hours Reason for consult: Follow-up assessment;Term;Breastfeeding assistance   Maternal Data This is mom's 6th baby and her first C/S.Mom with history of GDM, CHTN, AMA, and obesity.  On follow-up baby now >than 24 hours and per mom baby continues to have latching difficulty at mom's right breast. Discussed with mom option to initiate pumping, Mom agreeable to begin pumping. Baby was gaggy at beginning of feeding attempt and spit up X2. Baby also with a large meconium diaper at this time. Has patient been taught Hand Expression?: Yes Does the patient have breastfeeding experience prior to this delivery?: Yes How long did the patient breastfeed?: She has breastfed for varying lengths of time with her children. The last 49 her 69 year old for 7 months and her 35 year old for 3 months.  Feeding Mother's Current Feeding Choice: Breast Milk and Formula (Mom's goal is Breastmilk. She is using formula as a bridge until her milk comes in if it is needed. If baby breastfeeds she prefers that.) Provided mom tips and strategies to maximize position and latch techniques. Baby continued to have challenges maintaining latch at mom's right breast. Baby ate well at mom's left breast. Mom pumped and 1 cc of colostrum was proved to baby.  LATCH Score Latch: Grasps breast easily, tongue down, lips flanged, rhythmical sucking. (At mom's left breast is a 2, however at mom's right breast the score is a 1 for baby needed repeated attempts at the right to latch.)  Audible Swallowing: Spontaneous and intermittent  Type of Nipple: Everted at rest and after stimulation  Comfort (Breast/Nipple): Soft / non-tender  Hold (Positioning): Assistance needed to correctly position infant at breast and maintain latch.  LATCH Score: 9   Lactation Tools Discussed/Used  DEBP, use/cleaning  of parts/milk storage guidelines.  Interventions Interventions: Breast feeding basics reviewed;Assisted with latch;Breast massage;Breast compression;Adjust position;Support pillows;Position options;DEBP;Education Recommended mom offer baby to breast with cues at least 8 times/24 hours.If baby will not latch at mom's right breast in either the football hold or cross lap mom can pump and provided back her expressed milk to baby.   Discharge Pump: Personal (per mom the pump are wearable cups.)  Consult Status Consult Status: Follow-up Date: 01/27/24 Follow-up type: In-patient  Update provided to care nurse  Fuller Song 01/26/2024, 10:19 PM

## 2024-01-26 NOTE — Progress Notes (Signed)
 Postop Day  1  Subjective: 33 y.o. [id number] postpartum day #1  status post primary cesarean section. She is ambulating, is tolerating po, is voiding spontaneously.  Her pain is well controlled on PO pain medications. Her lochia is less than menses.  Objective: BP 118/72 (BP Location: Left Arm)   Pulse 83   Temp 98.7 F (37.1 C) (Oral)   Resp 20   Ht 4\' 11"  (1.499 m)   Wt 83.5 kg   LMP 04/27/2023   SpO2 99%   Breastfeeding Unknown   BMI 37.16 kg/m    Physical Exam:  General: alert, cooperative, and appears stated age Breasts: soft/nontender Pulm: nl effort Abdomen: soft, non-tender, active bowel sounds Uterine Fundus: firm Incision: healing well, no significant drainage, no dehiscence, no significant erythema Perineum: minimal edema, intact Lochia: appropriate DVT Evaluation: No evidence of DVT seen on physical exam. Negative Homan's sign. No cords or calf tenderness. No significant calf/ankle edema.  Recent Labs    01/25/24 0935 01/26/24 0342  HGB 12.5 9.8*  HCT 36.3 28.8*  WBC 9.0 9.2  PLT 225 192    Assessment/Plan: 33 y.o. [id number] Postop Day  1  1. Continue routine postpartum care  2. Infant feeding status: breast feeding --Lactation consult PRN for breastfeeding   3. Contraception plan: bilateral tubal ligation  4. Acute blood loss anemia - clinically significant.  --Hemodynamically stable and asymptomatic --Intervention: continue on oral supplementation with ferrous sulfate 325  5. Immunization status:   all immunizations up to date  6. TOC consult placed and completed on 01/26/24  Disposition: continue inpatient postpartum care    LOS: 1 day   Annsley Akkerman, CNM 01/26/2024, 1:54 PM   ----- Chari Manning Certified Nurse Midwife [facility]

## 2024-01-26 NOTE — Anesthesia Postprocedure Evaluation (Signed)
 Anesthesia Post Note  Patient: Insurance claims handler  Procedure(s) Performed: CESAREAN SECTION, WITH BILATERAL TUBAL LIGATION (Bilateral)  Patient location during evaluation: Mother Baby Anesthesia Type: Spinal Level of consciousness: awake and alert and oriented Pain management: pain level controlled Vital Signs Assessment: post-procedure vital signs reviewed and stable Respiratory status: spontaneous breathing and respiratory function stable Cardiovascular status: blood pressure returned to baseline Postop Assessment: no headache, no backache, patient able to bend at knees, no apparent nausea or vomiting, adequate PO intake and able to ambulate Anesthetic complications: no   No notable events documented.   Last Vitals:  Vitals:   01/26/24 0400 01/26/24 0500  BP:    Pulse: 86 72  Resp:    Temp:    SpO2: 99% 97%    Last Pain:  Vitals:   01/26/24 0300  TempSrc: Oral  PainSc:                  Katherine Basset

## 2024-01-27 DIAGNOSIS — Z98891 History of uterine scar from previous surgery: Secondary | ICD-10-CM

## 2024-01-27 LAB — SURGICAL PATHOLOGY

## 2024-01-27 MED ORDER — IBUPROFEN 600 MG PO TABS
600.0000 mg | ORAL_TABLET | Freq: Four times a day (QID) | ORAL | Status: DC
  Administered 2024-01-27 (×2): 600 mg via ORAL
  Filled 2024-01-27 (×2): qty 1

## 2024-01-27 MED ORDER — FERROUS SULFATE 325 (65 FE) MG PO TABS: 325.0000 mg | ORAL_TABLET | Freq: Two times a day (BID) | ORAL | Status: AC

## 2024-01-27 MED ORDER — IBUPROFEN 600 MG PO TABS: 600.0000 mg | ORAL_TABLET | Freq: Four times a day (QID) | ORAL | 0 refills | Status: AC

## 2024-01-27 MED ORDER — ACETAMINOPHEN 500 MG PO TABS
1000.0000 mg | ORAL_TABLET | Freq: Four times a day (QID) | ORAL | Status: DC
  Administered 2024-01-27 (×2): 1000 mg via ORAL
  Filled 2024-01-27 (×2): qty 2

## 2024-01-27 MED ORDER — OXYCODONE HCL 5 MG PO TABS: 5.0000 mg | ORAL_TABLET | Freq: Four times a day (QID) | ORAL | 0 refills | Status: AC | PRN

## 2024-01-27 NOTE — Progress Notes (Signed)
 Patient discharged. Discharge instructions given. Patient verbalizes understanding. Transported by staff.

## 2024-01-27 NOTE — Lactation Note (Signed)
 This note was copied from a baby's chart. Lactation Consultation Note  Patient Name: Kathy Benton ZOXWR'U Date: 01/27/2024 Age:35 hours Reason for consult: Maternal discharge  LC check in prior to discharge to ensure MOB and infant are comfortable with feeding plan and what to expect. Mom reports she is pumping using hospital DEBP and just removed 5mL which infant consumed along with 10 mL infant formula prior to [facility] room entry. MOB plans to continue offering breast for direct feeding, but reports infant is gassy/fussy/hiccuping at breast and hesitant to latch for full feeding duration. MOB states she is planning to express milk at home using hands free DEBP and is familiar with how to utilize and clean pump, as well as storage guidelines for expressed milk. She is also in communication with [facility] services and will be utilizing powdered formula to supplement additional feeding requirements.  Maternal Data Has patient been taught Hand Expression?: Yes Does the patient have breastfeeding experience prior to this delivery?: Yes  Feeding Mother's Current Feeding Choice: Breast Milk and Formula Nipple Type: Slow - flow MOB reports mixing formula with EBM-LC advised on formula preparation prior to adding supplemental quantities of breast milk to ensure proper nutrient/electrolyte distribution. No feeding observed per infant swaddled, sleeping, and recently fed a combined 15mL of EBM/formula.      Lactation Tools Discussed/Used  Per MOB no longer utilizing shield as LC assistance with latch  resolved 3/27. Plans on using DEBP, reviewed pumping schedule, supply expectations following surgery, and engorgement relief/mastitis prevention.   Interventions Interventions: Breast feeding basics reviewed;Breast massage;Breast compression;Education;CDC milk storage guidelines;CDC Guidelines for Breast Pump Cleaning  Discharge Discharge Education: Engorgement and breast care;Warning signs for feeding  baby;Outpatient recommendation Pump: Hands Free (Hands Free DEBP) WIC Program: Yes  Consult Status Consult Status: Complete Date: 01/27/24 Follow-up type: In-patient    Lennie Hummer 01/27/2024, 12:39 PM
